# Patient Record
Sex: Male | Born: 1960
Health system: Southern US, Community
[De-identification: ages and names within clinical notes are randomized; demographics above are authoritative.]

## PROBLEM LIST (undated history)

## (undated) DIAGNOSIS — E039 Hypothyroidism, unspecified: Secondary | ICD-10-CM

## (undated) DIAGNOSIS — I10 Essential (primary) hypertension: Secondary | ICD-10-CM

## (undated) DIAGNOSIS — E079 Disorder of thyroid, unspecified: Secondary | ICD-10-CM

## (undated) HISTORY — DX: Essential (primary) hypertension: I10

## (undated) HISTORY — PX: CERVICAL DISC SURGERY: SHX588

## (undated) HISTORY — PX: SHOULDER SURGERY: SHX246

## (undated) HISTORY — PX: QUADRICEPS TENDON REPAIR: SHX756

## (undated) HISTORY — DX: Disorder of thyroid, unspecified: E07.9

---

## 1999-08-27 ENCOUNTER — Encounter: Payer: Self-pay | Admitting: Gastroenterology

## 1999-08-27 ENCOUNTER — Encounter: Admission: RE | Admit: 1999-08-27 | Discharge: 1999-08-27 | Payer: Self-pay | Admitting: Gastroenterology

## 2002-04-25 ENCOUNTER — Encounter: Payer: Self-pay | Admitting: Emergency Medicine

## 2002-04-25 ENCOUNTER — Emergency Department (HOSPITAL_COMMUNITY): Admission: EM | Admit: 2002-04-25 | Discharge: 2002-04-25 | Payer: Self-pay | Admitting: Emergency Medicine

## 2002-06-15 ENCOUNTER — Encounter: Admission: RE | Admit: 2002-06-15 | Discharge: 2002-06-15 | Payer: Self-pay | Admitting: Neurosurgery

## 2002-06-15 ENCOUNTER — Encounter: Payer: Self-pay | Admitting: Neurosurgery

## 2002-06-29 ENCOUNTER — Encounter: Payer: Self-pay | Admitting: Neurosurgery

## 2002-06-29 ENCOUNTER — Encounter: Admission: RE | Admit: 2002-06-29 | Discharge: 2002-06-29 | Payer: Self-pay | Admitting: Neurosurgery

## 2002-07-20 ENCOUNTER — Encounter: Payer: Self-pay | Admitting: Neurosurgery

## 2002-07-20 ENCOUNTER — Encounter: Admission: RE | Admit: 2002-07-20 | Discharge: 2002-07-20 | Payer: Self-pay | Admitting: Neurosurgery

## 2002-07-29 ENCOUNTER — Encounter: Payer: Self-pay | Admitting: Emergency Medicine

## 2002-07-29 ENCOUNTER — Encounter: Admission: RE | Admit: 2002-07-29 | Discharge: 2002-07-29 | Payer: Self-pay | Admitting: Emergency Medicine

## 2003-11-18 ENCOUNTER — Emergency Department (HOSPITAL_COMMUNITY): Admission: EM | Admit: 2003-11-18 | Discharge: 2003-11-18 | Payer: Self-pay | Admitting: Emergency Medicine

## 2003-12-06 ENCOUNTER — Ambulatory Visit (HOSPITAL_COMMUNITY): Admission: RE | Admit: 2003-12-06 | Discharge: 2003-12-06 | Payer: Self-pay | Admitting: Orthopedic Surgery

## 2003-12-14 ENCOUNTER — Ambulatory Visit (HOSPITAL_COMMUNITY): Admission: RE | Admit: 2003-12-14 | Discharge: 2003-12-14 | Payer: Self-pay | Admitting: Orthopedic Surgery

## 2003-12-27 ENCOUNTER — Encounter: Admission: RE | Admit: 2003-12-27 | Discharge: 2004-01-19 | Payer: Self-pay | Admitting: Neurological Surgery

## 2005-11-22 ENCOUNTER — Ambulatory Visit: Payer: Self-pay | Admitting: Cardiology

## 2007-02-16 ENCOUNTER — Ambulatory Visit: Payer: Self-pay | Admitting: Cardiology

## 2007-02-16 LAB — CONVERTED CEMR LAB
AST: 37 units/L (ref 0–37)
BUN: 19 mg/dL (ref 6–23)
Bilirubin, Direct: 0.1 mg/dL (ref 0.0–0.3)
CO2: 29 meq/L (ref 19–32)
Chloride: 114 meq/L — ABNORMAL HIGH (ref 96–112)
Cholesterol: 166 mg/dL (ref 0–200)
Creatinine, Ser: 1.4 mg/dL (ref 0.4–1.5)
GFR calc Af Amer: 71 mL/min
HDL: 33.7 mg/dL — ABNORMAL LOW (ref >39.0)
Potassium: 3.9 meq/L (ref 3.5–5.1)
Sodium: 145 meq/L (ref 135–145)

## 2007-03-06 ENCOUNTER — Encounter: Payer: Self-pay | Admitting: Cardiology

## 2007-03-06 ENCOUNTER — Ambulatory Visit: Payer: Self-pay

## 2007-06-15 ENCOUNTER — Emergency Department (HOSPITAL_COMMUNITY): Admission: EM | Admit: 2007-06-15 | Discharge: 2007-06-16 | Payer: Self-pay | Admitting: Emergency Medicine

## 2008-07-14 ENCOUNTER — Emergency Department (HOSPITAL_COMMUNITY): Admission: EM | Admit: 2008-07-14 | Discharge: 2008-07-14 | Payer: Self-pay | Admitting: Family Medicine

## 2009-03-20 ENCOUNTER — Emergency Department (HOSPITAL_COMMUNITY): Admission: EM | Admit: 2009-03-20 | Discharge: 2009-03-20 | Payer: Self-pay | Admitting: Emergency Medicine

## 2010-12-25 NOTE — Assessment & Plan Note (Signed)
[facility]                            CARDIOLOGY OFFICE NOTE   NAME:Jeremiah Webb, Jeremiah Webb                       MRN:          [id number]  DATE:02/16/2007                            DOB:          1960-12-07    HISTORY OF PRESENT ILLNESS:  Jeremiah Webb is a 50 year old gentleman who I  last saw in April 2007.  He has a history of palpitations secondary to  PVCs.  Note a stress echocardiogram was performed in May 2001.  At that  time, there was no ischemia, and there was normal LV function.  He has  done well since then.  However, recently, he has noticed worsening  palpitations.  They are described as a stop and then his heart  resumes.  They are not sustained.  Note, he occasionally feels a pain in  his chest for 1-2 seconds, but he does not have exertional chest pain.  There is no dyspnea on exertion, orthopnea or PND.  Because of his  palpitations, he wanted to be seen.  Also note, there is no history of  syncope or presyncope, and there is no family history of sudden death.  Also note, he is not exercising quite as much as he did formally, due to  his job.  His medications at present including multivitamin .   PHYSICAL EXAMINATION:  VITAL SIGNS:  Today, blood pressure 144/85, pulse  63, weight 211 pounds.  HEENT:  Normal.  NECK:  Supple with no bruits.  CHEST:  Clear.  CARDIOVASCULAR:  Regular rate and rhythm.  There are no murmurs, rubs or  gallops noted.  ABDOMEN:  Benign.  EXTREMITIES:  No edema.   STUDIES:  Electrocardiogram today shows a normal sinus rhythm at a rate  of 59.  There is no RV conduction delay.   DIAGNOSES:  1. Palpitations secondary to PVCs.  The way he is describing it, these      again sound to be PVCs.  I have explained in the setting of normal      LV function that these are typically benign.  If they become more      bothersome in the future, then we could consider adding a beta      blocker at that point.  He is not  interested in pursuing that at      this point.  Note, we will check a TSH, B-met, lipids and liver      today.  2. Atypical chest pain.  He does have a strong family history of      coronary disease in his mother.  He is also concerned about the      possibility of coronary disease, and we will schedule him to have a      stress echocardiogram for risk stratification.  I have discussed      the importance of risk factor modification including diet and      exercise.  Note, he does not smoke.  3. Borderline hypertension.  We discussed lifestyle modifications      today including diet, improving his exercise.  He will  monitor this      at home, and if it continues to be mildly elevated, then we will      consider adding therapy in the future.  I would probably start with      the beta blocker to treat both his hypertension and his      palpitations.  4. History of hypothyroidism.  We will check a TSH today.  5. Risk factors.  We will check lipids and liver and treat as      indicated.   We will see him back in three months.     Madolyn Frieze Jeremiah Som, MD, [facility]  Electronically Signed    BSC/MedQ  DD: 02/16/2007  DT: 02/16/2007  Job #: [id number]

## 2011-02-22 ENCOUNTER — Emergency Department (HOSPITAL_COMMUNITY)
Admission: EM | Admit: 2011-02-22 | Discharge: 2011-02-22 | Disposition: A | Discharge status: Home / Self Care | Payer: Self-pay | Attending: Emergency Medicine | Admitting: Emergency Medicine

## 2011-02-22 DIAGNOSIS — S0003XA Contusion of scalp, initial encounter: Secondary | ICD-10-CM | POA: Insufficient documentation

## 2011-02-22 DIAGNOSIS — R220 Localized swelling, mass and lump, head: Secondary | ICD-10-CM | POA: Insufficient documentation

## 2011-02-22 DIAGNOSIS — R221 Localized swelling, mass and lump, neck: Secondary | ICD-10-CM | POA: Insufficient documentation

## 2011-02-22 DIAGNOSIS — IMO0002 Reserved for concepts with insufficient information to code with codable children: Secondary | ICD-10-CM | POA: Insufficient documentation

## 2011-02-22 DIAGNOSIS — E039 Hypothyroidism, unspecified: Secondary | ICD-10-CM | POA: Insufficient documentation

## 2011-05-17 LAB — DIFFERENTIAL
Lymphs Abs: 1.5 10*3/uL (ref 0.7–4.0)
Monocytes Relative: 8 % (ref 3–12)
Neutro Abs: 4.7 10*3/uL (ref 1.7–7.7)
Neutrophils Relative %: 66 % (ref 43–77)

## 2011-05-17 LAB — CBC
HCT: 43.1 % (ref 39.0–52.0)
MCV: 89.6 fL (ref 78.0–100.0)
RBC: 4.82 MIL/uL (ref 4.22–5.81)
RDW: 12 % (ref 11.5–15.5)

## 2011-05-17 LAB — POCT I-STAT, CHEM 8
BUN: 16 mg/dL (ref 6–23)
Calcium, Ion: 0.91 mmol/L — ABNORMAL LOW (ref 1.12–1.32)
Chloride: 110 mEq/L (ref 96–112)
Creatinine, Ser: 1.4 mg/dL (ref 0.4–1.5)
Potassium: 4 mEq/L (ref 3.5–5.1)
TCO2: 24 mmol/L (ref 0–100)

## 2011-07-27 ENCOUNTER — Emergency Department (HOSPITAL_COMMUNITY)
Admission: EM | Admit: 2011-07-27 | Discharge: 2011-07-27 | Disposition: A | Discharge status: Home / Self Care | Payer: Self-pay | Attending: Emergency Medicine | Admitting: Emergency Medicine

## 2011-07-27 DIAGNOSIS — X500XXA Overexertion from strenuous movement or load, initial encounter: Secondary | ICD-10-CM | POA: Insufficient documentation

## 2011-07-27 DIAGNOSIS — S8990XA Unspecified injury of unspecified lower leg, initial encounter: Secondary | ICD-10-CM | POA: Insufficient documentation

## 2011-07-27 DIAGNOSIS — S99929A Unspecified injury of unspecified foot, initial encounter: Secondary | ICD-10-CM | POA: Insufficient documentation

## 2011-07-27 DIAGNOSIS — S76119A Strain of unspecified quadriceps muscle, fascia and tendon, initial encounter: Secondary | ICD-10-CM

## 2011-07-27 DIAGNOSIS — S838X9A Sprain of other specified parts of unspecified knee, initial encounter: Secondary | ICD-10-CM | POA: Insufficient documentation

## 2011-07-27 DIAGNOSIS — S86819A Strain of other muscle(s) and tendon(s) at lower leg level, unspecified leg, initial encounter: Secondary | ICD-10-CM | POA: Insufficient documentation

## 2011-07-27 MED ORDER — IBUPROFEN 800 MG PO TABS
800.0000 mg | ORAL_TABLET | Freq: Once | ORAL | Status: AC
  Administered 2011-07-27: 800 mg via ORAL
  Filled 2011-07-27: qty 1

## 2011-07-27 MED ORDER — IBUPROFEN 800 MG PO TABS: 800.0000 mg | ORAL_TABLET | Freq: Three times a day (TID) | ORAL | Status: AC

## 2011-07-27 MED ORDER — HYDROCODONE-ACETAMINOPHEN 5-325 MG PO TABS: 1.0000 | ORAL_TABLET | ORAL | Status: AC | PRN

## 2011-07-27 NOTE — Progress Notes (Signed)
Orthopedic Tech Progress Note Patient Details:  Jeremiah Webb [birth year] [id number]  Other Ortho Devices Type of Ortho Device: Knee Immobilizer;Crutches   Webb, Jeremiah Bail 07/27/2011, 2:59 PM

## 2011-07-27 NOTE — ED Provider Notes (Signed)
History     CSN: [id number] Arrival date & time: 07/27/2011  1:40 PM   First MD Initiated Contact with Patient 07/27/11 1352      Chief Complaint  Patient presents with  . Knee Injury    (Consider location/radiation/quality/duration/timing/severity/associated sxs/prior treatment) HPI History provided by pt.   Pt was carrying a heavy TV this morning.  Stepped up onto a curb, L knee popped and gave way on him.  Knee painful, appears to be deformed and he is unable to bear weight.  No associated paresthesias.  No prior h/o injury to this knee.   Pt is not on steroids or any other medications.    History reviewed. No pertinent past medical history.  History reviewed. No pertinent past surgical history.  History reviewed. No pertinent family history.  History  Substance Use Topics  . Smoking status: Never Smoker   . Smokeless tobacco: Not on file  . Alcohol Use: Yes      Review of Systems  All other systems reviewed and are negative.    Allergies  Review of patient's allergies indicates no known allergies.  Home Medications  No current outpatient prescriptions on file.  BP 128/77  Pulse 76  Temp(Src) 98.2 F (36.8 C) (Oral)  Resp 20  Ht 6\' 3"  (1.905 m)  Wt 225 lb (102.059 kg)  BMI 28.12 kg/m2  SpO2 96%  Physical Exam  Nursing note and vitals reviewed. Constitutional: He is oriented to person, place, and time. He appears well-developed and well-nourished. No distress.  HENT:  Head: Normocephalic and atraumatic.  Eyes:       Normal appearance  Neck: Normal range of motion.  Musculoskeletal:       Knee w/out deformity or edema.  Quadricepts tendon appears to be ruptured w/ retracted and bulging quadricept muscle.  Knee tenderness localized to medial joint line.  No pain w/ passive ROM.  Pt is unable to actively extend knee nor hold it in extension.  Distal NV intact.   Neurological: He is alert and oriented to person, place, and time.  Psychiatric: He has a  normal mood and affect. His behavior is normal.    ED Course  Procedures (including critical care time)  Labs Reviewed - No data to display No results found.   1. Ruptured, tendon, quadriceps       MDM  Pt presents w/ left knee injury.  S/sx most consistent w/ quadricepts tendon rupture.  [facility] consulted and recommended he call the office Monday.  Ortho tech placed in knee immobilizer and provided him w/ crutches.  Pt received ibuprofen in ED and d/c'd home w/ same + vicodin.          Otilio Miu, [place] 07/27/11 1753

## 2011-07-27 NOTE — ED Notes (Signed)
Ortho Tech at bedside.  

## 2011-07-27 NOTE — ED Notes (Signed)
Lt. Knee swelling and pain, pt. Lifting a TV and his lt. Knee gave out.

## 2011-07-28 NOTE — ED Provider Notes (Signed)
Medical screening examination/treatment/procedure(s) were performed by non-physician practitioner and as supervising physician I was immediately available for consultation/collaboration.  Mavis Fichera R Carey Johndrow, MD 07/28/11 1030 

## 2011-09-15 ENCOUNTER — Other Ambulatory Visit: Payer: Self-pay | Admitting: Physician Assistant

## 2011-09-15 DIAGNOSIS — E039 Hypothyroidism, unspecified: Secondary | ICD-10-CM

## 2011-09-15 MED ORDER — LEVOTHYROXINE SODIUM 125 MCG PO TABS: 125.0000 ug | ORAL_TABLET | Freq: Every day | ORAL | Status: DC

## 2011-09-18 ENCOUNTER — Ambulatory Visit (INDEPENDENT_AMBULATORY_CARE_PROVIDER_SITE_OTHER): Payer: BC Managed Care – PPO | Admitting: Family Medicine

## 2011-09-18 VITALS — BP 116/90 | HR 72 | Temp 98.0°F | Resp 16 | Ht 74.0 in | Wt 208.4 lb

## 2011-09-18 DIAGNOSIS — S838X9A Sprain of other specified parts of unspecified knee, initial encounter: Secondary | ICD-10-CM

## 2011-09-18 DIAGNOSIS — Z842 Family history of other diseases of the genitourinary system: Secondary | ICD-10-CM

## 2011-09-18 DIAGNOSIS — S86819A Strain of other muscle(s) and tendon(s) at lower leg level, unspecified leg, initial encounter: Secondary | ICD-10-CM

## 2011-09-18 DIAGNOSIS — E039 Hypothyroidism, unspecified: Secondary | ICD-10-CM

## 2011-09-18 DIAGNOSIS — S76119A Strain of unspecified quadriceps muscle, fascia and tendon, initial encounter: Secondary | ICD-10-CM

## 2011-09-18 LAB — COMPREHENSIVE METABOLIC PANEL
Albumin: 4 g/dL (ref 3.5–5.2)
BUN: 17 mg/dL (ref 6–23)
Calcium: 10 mg/dL (ref 8.4–10.5)
Chloride: 106 mEq/L (ref 96–112)
Creat: 1.35 mg/dL (ref 0.50–1.35)
Glucose, Bld: 100 mg/dL — ABNORMAL HIGH (ref 70–99)
Potassium: 4.4 mEq/L (ref 3.5–5.3)
Sodium: 141 mEq/L (ref 135–145)
Total Bilirubin: 0.7 mg/dL (ref 0.3–1.2)
Total Protein: 7.2 g/dL (ref 6.0–8.3)

## 2011-09-18 LAB — PSA: PSA: 1.31 ng/mL (ref <=4.00)

## 2011-09-18 LAB — TSH: TSH: 8.825 u[IU]/mL — ABNORMAL HIGH (ref 0.350–4.500)

## 2011-09-18 LAB — LIPID PANEL
Cholesterol: 147 mg/dL (ref 0–200)
Triglycerides: 123 mg/dL (ref <150)

## 2011-09-18 MED ORDER — LEVOTHYROXINE SODIUM 125 MCG PO TABS: 125.0000 ug | ORAL_TABLET | Freq: Every day | ORAL | Status: DC

## 2011-09-18 NOTE — Progress Notes (Signed)
  Subjective:    Patient ID: Jeremiah Webb, male    DOB: [birth year], 51 y.o.   MRN: [id number]  HPI patient is here for recheck with regard to his thyroid. He took his last medicine today. He's been doing well with that with no known problems.  He did have a bad incident with his left leg before Christmas when he ruptured the patellar tendon. Dr. Thomasena Edis did surgical repair of this, and he is improving significantly. He hopes that he can return to work next month.  The patient does request a general chemistry cyst is been a number of years since he had them done. There is a family history of benign prostatic hypertrophy, so wants to be sure to get that PSA check.    Review of Systems he does have some headaches, not sure why. Cardiovascular respiratory GI and GU review of systems are all unremarkable.     Objective:   Physical Exam  Pleasant Afro-American male in no acute distress this time TMs normal throat clear neck supple without nodes chest clear heart regular without murmurs gallops arrhythmias      Assessment & Plan:  Hypothyroidism Recent rupture or quadriceps tendon  Check lab work refill his medication see him back in the fall for acute

## 2011-09-18 NOTE — Patient Instructions (Signed)
Jeremiah Webb will be set to the patient. If he does not hear from [place] in about 10 days he is to contact us

## 2011-09-19 ENCOUNTER — Other Ambulatory Visit: Payer: Self-pay | Admitting: Family Medicine

## 2011-09-19 ENCOUNTER — Encounter: Payer: Self-pay | Admitting: Family Medicine

## 2011-09-19 DIAGNOSIS — E039 Hypothyroidism, unspecified: Secondary | ICD-10-CM

## 2011-09-21 MED ORDER — LEVOTHYROXINE SODIUM 150 MCG PO TABS: 150.0000 ug | ORAL_TABLET | Freq: Every day | ORAL | Status: DC

## 2011-09-21 NOTE — Progress Notes (Signed)
Addended by: Deigo Alonso H on: 09/21/2011 06:56 PM   Modules accepted: Orders

## 2012-06-06 ENCOUNTER — Telehealth: Payer: Self-pay

## 2012-06-06 DIAGNOSIS — E039 Hypothyroidism, unspecified: Secondary | ICD-10-CM

## 2012-06-06 MED ORDER — LEVOTHYROXINE SODIUM 150 MCG PO TABS: 150.0000 ug | ORAL_TABLET | Freq: Every day | ORAL | Status: DC

## 2012-06-06 NOTE — Telephone Encounter (Signed)
Our documentation indicates that he should be on levothyroxine 150 mcg. I will send in levothyroxine 150 mcg. He does need to recheck this before he runs out.

## 2012-06-06 NOTE — Telephone Encounter (Signed)
Spoke with pt his dosage was changed from 125 mcg to 150 mcg of Synthroid. His pharmacy gave him the old RX of 125 mcg. Can we send in another RX for Synthroid 150 mcg? Please advise

## 2012-06-06 NOTE — Telephone Encounter (Signed)
Spoke with pharmacist advised pt Rx ready to pick up

## 2012-09-15 ENCOUNTER — Other Ambulatory Visit: Payer: Self-pay | Admitting: Physician Assistant

## 2012-09-16 NOTE — Telephone Encounter (Signed)
Needs ov and labs, last seen Feb 2013

## 2012-10-08 ENCOUNTER — Ambulatory Visit (INDEPENDENT_AMBULATORY_CARE_PROVIDER_SITE_OTHER): Payer: Commercial Managed Care - PPO | Admitting: Family Medicine

## 2012-10-08 VITALS — BP 124/75 | HR 66 | Temp 97.8°F | Resp 18 | Ht 73.5 in | Wt 222.0 lb

## 2012-10-08 DIAGNOSIS — E039 Hypothyroidism, unspecified: Secondary | ICD-10-CM

## 2012-10-08 NOTE — Progress Notes (Signed)
Jeremiah Webb is a 52 y.o. male who presents to Urgent Care today with complaints of hypothyroidism:  1. Hypothyroidism:  Patient diagnosed with hypothyroidism several years ago.  In the computer we have TSH of 13.  Started on Synthroid and has had increasing dosages of Synthroid since then.  Last TSH was 8.825 in 2013.  Has never had any symptoms of hypothyrodisim.  Has been out of his medications for past 2 days.  Doesn't want to take medication if he doesn't have to.  No other chronic medical issues.  No diabetes.    He denies any current or past chest pain, palpitations, SOB, fever, chills, heat/cold intolerance, edema, history of diabetes, polyuria/polydipsia.  He endorses healthy diet and regular working out.   PMH reviewed.  Past Medical History  Diagnosis Date  . Thyroid disease     Hypothyroid   Past Surgical History  Procedure Laterality Date  . Patellar tendon repair    . Quadriceps tendon repair      Medications reviewed. Current Outpatient Prescriptions  Medication Sig Dispense Refill  . levothyroxine (SYNTHROID, LEVOTHROID) 150 MCG tablet Take 1 tablet (150 mcg total) by mouth daily. NEED VISIT/LABS!!  15 tablet  0   No current facility-administered medications for this visit.    ROS as above otherwise neg.    Physical Exam:  BP 124/75  Pulse 66  Temp(Src) 97.8 F (36.6 C) (Oral)  Resp 18  Ht 6' 1.5" (1.867 m)  Wt 222 lb (100.699 kg)  BMI 28.89 kg/m2  SpO2 97% Gen:  Alert, cooperative patient who appears stated age in no acute distress.  Vital signs reviewed. Neck:  No goiter noted   Assessment and Plan:  1.  Hypothyroidism: - Checking TSH today.  Has been subclinical hypothyroidism based on lab levels.  - Patient would like to hold off on any medication for now.  - Will recheck TSH in 6 weeks to get better view of how his hypothyroidism will be off medications. - Of note, his cardiologist has told him that his TSH reveals hypothyroidism as well.  Will  need to obtain this records.  If these show evidence of hypothyroidism, will restart synthroid at that time.

## 2012-10-29 ENCOUNTER — Telehealth: Payer: Self-pay

## 2012-10-29 NOTE — Telephone Encounter (Signed)
Pt called inquiring about his labs. He saw Dr. Gwendolyn Grant. Can someone review them for me please. Thanks

## 2012-10-30 MED ORDER — LEVOTHYROXINE SODIUM 50 MCG PO TABS: 50.0000 ug | ORAL_TABLET | Freq: Every day | ORAL | Status: DC

## 2012-10-30 NOTE — Telephone Encounter (Signed)
Labs indicated a worsening hypothyroidism since he has been off of his medication. Recommend going back on medication and recheck TSH in 6-8 weeks. Please send the patient some information on hypothyroidism, risks, and complications. Thank you.

## 2012-10-30 NOTE — Telephone Encounter (Signed)
Called him to advise.  

## 2012-10-30 NOTE — Telephone Encounter (Signed)
Patient is also advised of the need to return for recheck of TSH in 6-8 weeks so his dose can be titrated.

## 2013-01-08 ENCOUNTER — Encounter (HOSPITAL_COMMUNITY): Payer: Self-pay | Admitting: Emergency Medicine

## 2013-01-08 ENCOUNTER — Emergency Department (INDEPENDENT_AMBULATORY_CARE_PROVIDER_SITE_OTHER)
Admission: EM | Admit: 2013-01-08 | Discharge: 2013-01-08 | Disposition: A | Discharge status: Home / Self Care | Payer: Commercial Managed Care - PPO | Source: Home / Self Care | Attending: Emergency Medicine | Admitting: Emergency Medicine

## 2013-01-08 ENCOUNTER — Emergency Department (INDEPENDENT_AMBULATORY_CARE_PROVIDER_SITE_OTHER): Payer: Commercial Managed Care - PPO

## 2013-01-08 DIAGNOSIS — M109 Gout, unspecified: Secondary | ICD-10-CM

## 2013-01-08 LAB — URIC ACID: Uric Acid, Serum: 10 mg/dL — ABNORMAL HIGH (ref 4.0–7.8)

## 2013-01-08 MED ORDER — KETOROLAC TROMETHAMINE 60 MG/2ML IM SOLN
60.0000 mg | Freq: Once | INTRAMUSCULAR | Status: AC
  Administered 2013-01-08: 60 mg via INTRAMUSCULAR

## 2013-01-08 MED ORDER — COLCHICINE 0.6 MG PO TABS: ORAL_TABLET | ORAL | Status: DC

## 2013-01-08 MED ORDER — KETOROLAC TROMETHAMINE 60 MG/2ML IM SOLN
INTRAMUSCULAR | Status: AC
  Filled 2013-01-08: qty 2

## 2013-01-08 NOTE — ED Provider Notes (Signed)
Chief Complaint:   Chief Complaint  Patient presents with  . Ankle Pain    right ankle pain since tuesday. denies injury    History of Present Illness:   Jeremiah Webb is a 52 year old male who has had a four-day history of right ankle pain, swelling, redness, and heat. He denies any injury. He's had no fever or chills. He denies any other joint pain or history of arthritis. He has no definite history of gout although he did have a uric acid level done 5 years ago which was elevated at 8.2. He cannot recall what prompted this uric acid to be done, and never received any treatment for it. He denies any specific precipitating factors although does note that this seemed to come on the day after his birthday during which he did partake of several alcoholic beverages.  Review of Systems:  Other than noted above, the patient denies any of the following symptoms: Systemic:  No fevers, chills, sweats, or aches.  No fatigue or tiredness. Musculoskeletal:  No joint pain, arthritis, bursitis, swelling, back pain, or neck pain. Neurological:  No muscular weakness, paresthesias, headache, or trouble with speech or coordination.  No dizziness.  PMFSH:  Past medical history, family history, social history, meds, and allergies were reviewed.    Physical Exam:   Vital signs:  BP 128/98  Pulse 88  Temp(Src) 97.9 F (36.6 C) (Oral)  SpO2 99% Gen:  Alert and oriented times 3.  In no distress. Musculoskeletal:  The right ankle was swollen, red, hot, and tender to palpation. Ankle range of motion was decreased with pain, joint survey was otherwise unremarkable.  Otherwise, all joints had a full a ROM with no swelling, bruising or deformity.  No edema, pulses full. Extremities were warm and pink.  Capillary refill was brisk.  Skin:  Clear, warm and dry.  No rash. Neuro:  Alert and oriented times 3.  Muscle strength was normal.  Sensation was intact to light touch.   Radiology:  Dg Ankle Complete  Right  01/08/2013   *RADIOLOGY REPORT*  Clinical Data: Ankle pain for 4 days without trauma.  RIGHT ANKLE - COMPLETE 3+ VIEW  Comparison: None.  Findings: Diffuse soft tissue swelling. No acute fracture or dislocation.  Base of fifth metatarsal and talar dome intact.  IMPRESSION: Soft tissue swelling, without acute osseous abnormality.   Original Report Authenticated By: Jeronimo Greaves, M.D.   I reviewed the images independently and personally and concur with the radiologist's findings.  Results for orders placed during the hospital encounter of 01/08/13  URIC ACID      Result Value Range   Uric Acid, Serum 10.0 (*) 4.0 - 7.8 mg/dL   Course in Urgent Care Center:   Given Toradol 60 mg IM.   Assessment:  The encounter diagnosis was Gout attack.  Uric acid was markedly elevated at 10.0. In this setting, gout is most likely. He was given a prescription for colchicine, and I urged him to followup with her primary care physician with regard to the elevated uric acid level and suggested getting on any urate lowering drug.   Plan:   1.  The following meds were prescribed:   Discharge Medication List as of 01/08/2013  6:12 PM    START taking these medications   Details  colchicine 0.6 MG tablet Take 2 now and 1 in 1 hour.  May repeat dose once daily.  For gout attack., Normal       2.  The  patient was instructed in symptomatic care, including rest and activity, elevation, application of ice and compression.  Appropriate handouts were given. 3.  The patient was told to return if becoming worse in any way, if no better in 3 or 4 days, and given some red flag symptoms such as  fever or worsening pain that would indicate earlier return.   4.  The patient was told to follow up With a primary care physician as soon as possible.    Reuben Likes, MD 01/08/13 2002

## 2013-01-08 NOTE — ED Notes (Signed)
Pt c/o pain in right ankle that started on Tuesday.  Pain has gradually gotten worse and swelling started on Thursday.  Pain with weight bearing. Pt has not tried any medication for treatment.  Denies injury.

## 2013-01-11 NOTE — ED Notes (Signed)
Patient called, asking for referral to Urologist; per Dr Lorenz Coaster, may refer to Dr Madaline Guthrie

## 2013-06-05 ENCOUNTER — Telehealth: Payer: Self-pay

## 2013-06-05 NOTE — Telephone Encounter (Signed)
Pt has been out of his thyroid medicine for about 4 weeks.  Says he is working extremely long hours and has not been able to come in for an OV.  Can we refill the medicine for him?  [phone]

## 2013-06-06 NOTE — Telephone Encounter (Signed)
In Feb he was told to follow up in 6 weeks, our office hours are very flexible. He still indicates inability to come in. Please advise.

## 2013-06-07 MED ORDER — LEVOTHYROXINE SODIUM 50 MCG PO TABS: 50.0000 ug | ORAL_TABLET | Freq: Every day | ORAL | Status: DC

## 2013-06-07 NOTE — Telephone Encounter (Signed)
I have sent two refills to pharmacy; he MUST have blood work in two months while taking medication; I will not send in further refills. We are open seven days a week; we are available to see him any day.

## 2013-06-08 NOTE — Telephone Encounter (Signed)
Called to advise. He is a Naval architect. 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 He will come in before this runs out.

## 2013-09-05 ENCOUNTER — Ambulatory Visit: Payer: Commercial Managed Care - PPO

## 2013-09-05 ENCOUNTER — Ambulatory Visit: Payer: Commercial Managed Care - PPO | Admitting: Emergency Medicine

## 2013-09-05 VITALS — BP 130/82 | HR 87 | Temp 98.7°F | Resp 17 | Ht 74.0 in | Wt 231.0 lb

## 2013-09-05 DIAGNOSIS — E039 Hypothyroidism, unspecified: Secondary | ICD-10-CM

## 2013-09-05 MED ORDER — LEVOTHYROXINE SODIUM 50 MCG PO TABS: 50.0000 ug | ORAL_TABLET | Freq: Every day | ORAL | Status: DC

## 2013-09-05 NOTE — Progress Notes (Signed)
[facility] [address], [place] [address] [phone]  Date:  09/05/2013   Name:  Jeremiah Webb   DOB:  1961-05-11   MRN:  [id number]  PCP:  Nilda Simmer, MD    Chief Complaint: Shortness of Breath and Thyroid Problem   History of Present Illness:  Jeremiah Webb is a 53 y.o. very pleasant male patient who presents with the following:  Not compliant with management of hypothyroidism.  Out of medication and requesting labs.  Not symptomatic.  Says he has shortness of breath when exposed to "vapors" as when eats steaming soup.  Concerned he may be "developing" COPD, asthma or CA lung. Non smoker.  Able to walk, climb steps and work with no SOB.  Has no cough, fever chills, wheezing.  No improvement with over the counter medications or other home remedies. Denies other complaint or health concern today.   There are no active problems to display for this patient.   Past Medical History  Diagnosis Date  . Thyroid disease     Hypothyroid    Past Surgical History  Procedure Laterality Date  . Patellar tendon repair    . Quadriceps tendon repair      History  Substance Use Topics  . Smoking status: Never Smoker   . Smokeless tobacco: Not on file  . Alcohol Use: Yes    No family history on file.  No Known Allergies  Medication list has been reviewed and updated.  Current Outpatient Prescriptions on File Prior to Visit  Medication Sig Dispense Refill  . levothyroxine (SYNTHROID, LEVOTHROID) 50 MCG tablet Take 1 tablet (50 mcg total) by mouth daily.  30 tablet  1   No current facility-administered medications on file prior to visit.    Review of Systems:  As per HPI, otherwise negative.    Physical Examination: Filed Vitals:   09/05/13 1052  BP: 130/82  Pulse: 87  Temp: 98.7 F (37.1 C)  Resp: 17   Filed Vitals:   09/05/13 1052  Height: 6\' 2"  (1.88 m)  Weight: 231 lb (104.781 kg)   Body mass index is 29.65 kg/(m^2). Ideal  Body Weight: Weight in (lb) to have BMI = 25: 194.3  GEN: WDWN, NAD, Non-toxic, A & O x 3 HEENT: Atraumatic, Normocephalic. Neck supple. No masses, No LAD. Ears and Nose: No external deformity. CV: RRR, No M/G/R. No JVD. No thrill. No extra heart sounds. PULM: CTA B, no wheezes, crackles, rhonchi. No retractions. No resp. distress. No accessory muscle use. ABD: S, NT, ND, +BS. No rebound. No HSM. EXTR: No c/c/e NEURO Normal gait.  PSYCH: Normally interactive. Conversant. Not depressed or anxious appearing.  Calm demeanor.    Assessment and Plan: Hypothyroid.  Off medications.  Refill for a month with no refills and follow up in the month for TSH Shortness of breath.  CXR Signed,  Phillips Odor, MD   [facility] reading (PRIMARY) by  Dr. Dareen Piano.  Negative chest.

## 2013-09-30 ENCOUNTER — Encounter: Payer: Self-pay | Admitting: Gastroenterology

## 2013-10-07 ENCOUNTER — Other Ambulatory Visit: Payer: Self-pay | Admitting: Emergency Medicine

## 2013-10-17 ENCOUNTER — Ambulatory Visit: Payer: Commercial Managed Care - PPO | Admitting: Family Medicine

## 2013-10-17 VITALS — BP 110/70 | HR 82 | Temp 98.5°F | Resp 16 | Ht 75.0 in | Wt 230.0 lb

## 2013-10-17 DIAGNOSIS — E039 Hypothyroidism, unspecified: Secondary | ICD-10-CM

## 2013-10-17 LAB — TSH: TSH: 14.101 u[IU]/mL — ABNORMAL HIGH (ref 0.350–4.500)

## 2013-10-17 MED ORDER — LEVOTHYROXINE SODIUM 50 MCG PO TABS: ORAL_TABLET | ORAL | Status: DC

## 2013-10-17 NOTE — Patient Instructions (Signed)

## 2013-10-17 NOTE — Progress Notes (Signed)
° °  Subjective:    Patient ID: Jeremiah Webb, male    DOB: Apr 11, 1961, 53 y.o.   MRN: [id number]  HPI This chart was scribed for Elvina Sidle, MD by Andrew Au, ED Scribe. This patient was seen in room 9 and the patient's care was started at 2:04 PM.  HPI Comments: Jeremiah Webb is a 53 y.o. male who [facility] to the [facility] requesting to get his TSH levels checked. Pt states that he has Hypothyroid and reports that he takes 50 mg synthroid daily. He reports that he has taken medication now for a couple years. Pt reports that he tries to stay in shape and that he goes to [employer].  Pt works as a Airline pilot.  Past Medical History  Diagnosis Date   Thyroid disease     Hypothyroid   No Known Allergies Prior to Admission medications   Medication Sig Start Date End Date Taking? Authorizing Provider  levothyroxine (SYNTHROID, LEVOTHROID) 50 MCG tablet Take 1 tablet (50 mcg total) by mouth daily. ABSOLUTELY NO refills with out lab tests 09/05/13  Yes Phillips Odor, MD   Review of Systems  Constitutional: Negative for fever and chills.      Objective:   Physical Exam  Nursing note and vitals reviewed. Constitutional: He is oriented to person, place, and time. He appears well-developed and well-nourished. No distress.  HENT:  Head: Normocephalic and atraumatic.  Eyes: EOM are normal.  Neck: Neck supple.  Cardiovascular: Normal rate.   Pulmonary/Chest: Effort normal.  Musculoskeletal: Normal range of motion.  Neurological: He is alert and oriented to person, place, and time.  Skin: Skin is warm and dry.  Psychiatric: He has a normal mood and affect. His behavior is normal.      Assessment & Plan:   Unspecified hypothyroidism - Plan: TSH  Signed, Elvina Sidle, MD

## 2013-10-18 ENCOUNTER — Other Ambulatory Visit: Payer: Self-pay | Admitting: Family Medicine

## 2013-10-18 ENCOUNTER — Telehealth: Payer: Self-pay

## 2013-10-18 DIAGNOSIS — E039 Hypothyroidism, unspecified: Secondary | ICD-10-CM

## 2013-10-18 MED ORDER — LEVOTHYROXINE SODIUM 100 MCG PO TABS: 100.0000 ug | ORAL_TABLET | Freq: Every day | ORAL | Status: DC

## 2013-10-18 NOTE — Telephone Encounter (Signed)
Result Notes    Notes Recorded by Ericka Pontiff on 10/18/2013 at 10:56 AM Left message for patient to call back. ------  Notes Recorded by Elvina Sidle, MD on 10/18/2013 at 10:30 AM Patient has abnormal lab values. Patient needs to double his dose of synthroid.  I will call in new script, but he can use up his 50 mcg tablets by taking two daily now           Results   TSH (Order [id number])       TSH  Status: Final result     Visible to patient: This result is not viewable by the patient.     Next appt: 11/15/2013 at 04:30 PM in Gastroenterology ([facility] Pre[facility] Rm[facility])     Dx: Unspecified hypothyroidism               Notes Recorded by Ericka Pontiff on 10/18/2013 at 10:56 AM Left message for patient to call back. Notes Recorded by Elvina Sidle, MD on 10/18/2013 at 10:30 AM Patient has abnormal lab values. Patient needs to double his dose of synthroid.  I will call in new script, but he can use up his 50 mcg tablets by taking two daily now        Ref Range 1d ago  28yr ago  70yr ago  26yr ago    TSH 0.350 - 4.500 uIU/mL 14.101 (H)    13.001 (H)    8.825 (H)    13.27 (H) R    Resulting Agency   [facility] [place] [facility]      Result Narrative        Performed at:  [facility]                [address], [address]                [place], [place] [address]         Specimen Collected: 10/17/13  2:24 PM Last Resulted: 10/17/13  7:02 PM            R=Reference range differs from displayed range                    Reviewed by List    Ericka Pontiff on 10/18/2013 11:13 AM    Elvina Sidle, MD on 10/18/2013 10:30 AM       Encounter    View Encounter      Result Information         Abnormal    Status: Final result (10/17/2013  7:02 PM)    Provider Status: Reviewed        Lab Information    [facility]                   Order-Level Documents:    There are no order-level documents.         TSH (Order [id number])  Lab  Order:  [id number]   Date: 10/17/2013  Department: [facility]  Ordering/Authorizing: Elvina Sidle, MD          Order Information      Order Date/Time Release Date/Time Start Date/Time End Date/Time      10/17/13 02:13 PM None 10/17/2013 None               Order Details      Frequency Duration Priority Order Class      None None Routine Clinic Collect  Acc#      [id number]              Associated Diagnoses      Unspecified hypothyroidism [244.9]  - Primary              Order History Outpatient      Date/Time Action Taken User Additional Information      10/17/13 1413 Sign Elvina SidleKurt Lauenstein, MD        10/17/13 1624 Result [facility] Interface In process      10/17/13 1902 Result [facility] Final             Order Requisition      TSH (Order #[id number]) on 10/17/13        Patient has abnormal lab values. Patient needs to double his dose of synthroid. I will call in new script, but he can use up his 50 mcg tablets by taking two daily now     Collection Information      Collected: 10/17/2013  2:24 PM   Resulting Agency: [facility]                 Reviewed labs with patient.  Patient states understanding.

## 2013-11-15 ENCOUNTER — Ambulatory Visit (AMBULATORY_SURGERY_CENTER): Payer: Commercial Managed Care - PPO | Admitting: *Deleted

## 2013-11-15 VITALS — Ht 75.0 in | Wt 234.4 lb

## 2013-11-15 DIAGNOSIS — Z1211 Encounter for screening for malignant neoplasm of colon: Secondary | ICD-10-CM

## 2013-11-15 DIAGNOSIS — Z800 Family history of malignant neoplasm of digestive organs: Secondary | ICD-10-CM

## 2013-11-15 MED ORDER — MOVIPREP 100 G PO SOLR: 1.0000 | Freq: Once | ORAL | Status: DC

## 2013-11-15 NOTE — Progress Notes (Signed)
Denies allergies to eggs or soy products. Denies complications with sedation or anesthesia. 

## 2013-11-24 ENCOUNTER — Encounter: Payer: Self-pay | Admitting: Gastroenterology

## 2013-11-26 ENCOUNTER — Encounter: Payer: Self-pay | Admitting: Gastroenterology

## 2013-11-26 ENCOUNTER — Ambulatory Visit (AMBULATORY_SURGERY_CENTER): Payer: Commercial Managed Care - PPO | Admitting: Gastroenterology

## 2013-11-26 VITALS — BP 131/81 | HR 58 | Temp 97.6°F | Resp 32 | Ht 75.0 in | Wt 234.0 lb

## 2013-11-26 DIAGNOSIS — Z800 Family history of malignant neoplasm of digestive organs: Secondary | ICD-10-CM

## 2013-11-26 DIAGNOSIS — Z1211 Encounter for screening for malignant neoplasm of colon: Secondary | ICD-10-CM

## 2013-11-26 MED ORDER — SODIUM CHLORIDE 0.9 % IV SOLN: 500.0000 mL | INTRAVENOUS | Status: DC

## 2013-11-26 NOTE — Op Note (Signed)
[facility] [address] [place], [address]   COLONOSCOPY PROCEDURE REPORT  PATIENT: Jeremiah Webb, Jeremiah Webb  MR#: [id number] BIRTHDATE: June 05, 1961 , 52  yrs. old GENDER: Male ENDOSCOPIST: Rachael Fee, MD REFERRED BP:ZWCHEN Katrinka Blazing, M.D. PROCEDURE DATE:  11/26/2013 PROCEDURE:   Colonoscopy, screening First Screening Colonoscopy - Avg.  risk and is 50 yrs.  old or older - No.  Prior Negative Screening - Now for repeat screening. 10 or more years since last screening  History of Adenoma - Now for follow-up colonoscopy & has been > or = to 3 yrs.  N/A  Polyps Removed Today? No.  Recommend repeat exam, <10 yrs? No. ASA CLASS:   Class II INDICATIONS:average risk screening (one grandparent with colon cancer) MEDICATIONS: MAC sedation, administered by CRNA and propofol (Diprivan) 200mg  IV  DESCRIPTION OF PROCEDURE:   After the risks benefits and alternatives of the procedure were thoroughly explained, informed consent was obtained.  A digital rectal exam revealed no abnormalities of the rectum.   The [id number]  endoscope was introduced through the anus and advanced to the cecum, which was identified by both the appendix and ileocecal valve. No adverse events experienced.   The quality of the prep was excellent.  The instrument was then slowly withdrawn as the colon was fully examined.   COLON FINDINGS: A normal appearing cecum, ileocecal valve, and appendiceal orifice were identified.  The ascending, hepatic flexure, transverse, splenic flexure, descending, sigmoid colon and rectum appeared unremarkable.  No polyps or cancers were seen. Retroflexed views revealed no abnormalities. The time to cecum=2 minutes 32 seconds.  Withdrawal time=8 minutes 33 seconds.  The scope was withdrawn and the procedure completed. COMPLICATIONS: There were no complications.  ENDOSCOPIC IMPRESSION: Normal colon No polyps or cancers  RECOMMENDATIONS: You should continue to  follow colorectal cancer screening guidelines for "routine risk" patients with a repeat colonoscopy in 10 years.   eSigned:  Rachael Fee, MD 11/26/2013 8:45 AM

## 2013-11-26 NOTE — Progress Notes (Signed)
Procedure ends, to recovery, report given and VSS. 

## 2013-11-26 NOTE — Patient Instructions (Signed)
YOU HAD AN ENDOSCOPIC PROCEDURE TODAY AT THE [facility]: Refer to the procedure report that was given to you for any specific questions about what was found during the examination.  If the procedure report does not answer your questions, please call your gastroenterologist to clarify.  If you requested that your care partner not be given the details of your procedure findings, then the procedure report has been included in a sealed envelope for you to review at your convenience later.  YOU SHOULD EXPECT: Some feelings of bloating in the abdomen. Passage of more gas than usual.  Walking can help get rid of the air that was put into your GI tract during the procedure and reduce the bloating. If you had a lower endoscopy (such as a colonoscopy or flexible sigmoidoscopy) you may notice spotting of blood in your stool or on the toilet paper. If you underwent a bowel prep for your procedure, then you may not have a normal bowel movement for a few days.  DIET: Your first meal following the procedure should be a light meal and then it is ok to progress to your normal diet.  A half-sandwich or bowl of soup is an example of a good first meal.  Heavy or fried foods are harder to digest and may make you feel nauseous or bloated.  Likewise meals heavy in dairy and vegetables can cause extra gas to form and this can also increase the bloating.  Drink plenty of fluids but you should avoid alcoholic beverages for 24 hours.  ACTIVITY: Your care partner should take you home directly after the procedure.  You should plan to take it easy, moving slowly for the rest of the day.  You can resume normal activity the day after the procedure however you should NOT DRIVE or use heavy machinery for 24 hours (because of the sedation medicines used during the test).    SYMPTOMS TO REPORT IMMEDIATELY: A gastroenterologist can be reached at any hour.  During normal business hours, 8:30 AM to 5:00 PM Monday through Friday,  call [phone].  After hours and on weekends, please call the GI answering service at [phone] who will take a message and have the physician on call contact you.   Following lower endoscopy (colonoscopy or flexible sigmoidoscopy):  Excessive amounts of blood in the stool  Significant tenderness or worsening of abdominal pains  Swelling of the abdomen that is new, acute  Fever of 100F or higher  FOLLOW UP: Our staff will call the home number listed on your records the next business day following your procedure to check on you and address any questions or concerns that you may have at that time regarding the information given to you following your procedure. This is a courtesy call and so if there is no answer at the home number and we have not heard from you through the emergency physician on call, we will assume that you have returned to your regular daily activities without incident.  SIGNATURES/CONFIDENTIALITY: You and/or your care partner have signed paperwork which will be entered into your electronic medical record.  These signatures attest to the fact that that the information above on your After Visit Summary has been reviewed and is understood.  Full responsibility of the confidentiality of this discharge information lies with you and/or your care-partner.  Please continue your normal medications  Next colonoscopy in 10 years

## 2013-11-29 ENCOUNTER — Telehealth: Payer: Self-pay | Admitting: *Deleted

## 2013-11-29 NOTE — Telephone Encounter (Signed)
  Follow up Call-  Call back number 11/26/2013  Post procedure Call Back phone  # [phone]  Permission to leave phone message Yes     Patient questions:  Do you have a fever, pain , or abdominal swelling? no Pain Score  0 *  Have you tolerated food without any problems? yes  Have you been able to return to your normal activities? yes  Do you have any questions about your discharge instructions: Diet   no Medications  no Follow up visit  no  Do you have questions or concerns about your Care? no  Actions: * If pain score is 4 or above: No action needed, pain <4.

## 2014-03-31 ENCOUNTER — Emergency Department (INDEPENDENT_AMBULATORY_CARE_PROVIDER_SITE_OTHER)
Admission: EM | Admit: 2014-03-31 | Discharge: 2014-03-31 | Disposition: A | Discharge status: Home / Self Care | Payer: Commercial Managed Care - PPO | Source: Home / Self Care

## 2014-03-31 ENCOUNTER — Emergency Department (INDEPENDENT_AMBULATORY_CARE_PROVIDER_SITE_OTHER): Payer: Commercial Managed Care - PPO

## 2014-03-31 ENCOUNTER — Encounter: Payer: Self-pay | Admitting: Emergency Medicine

## 2014-03-31 DIAGNOSIS — S29012A Strain of muscle and tendon of back wall of thorax, initial encounter: Secondary | ICD-10-CM

## 2014-03-31 DIAGNOSIS — M539 Dorsopathy, unspecified: Secondary | ICD-10-CM

## 2014-03-31 DIAGNOSIS — S161XXA Strain of muscle, fascia and tendon at neck level, initial encounter: Secondary | ICD-10-CM

## 2014-03-31 DIAGNOSIS — S239XXA Sprain of unspecified parts of thorax, initial encounter: Secondary | ICD-10-CM

## 2014-03-31 DIAGNOSIS — S139XXA Sprain of joints and ligaments of unspecified parts of neck, initial encounter: Secondary | ICD-10-CM

## 2014-03-31 DIAGNOSIS — M5412 Radiculopathy, cervical region: Secondary | ICD-10-CM

## 2014-03-31 MED ORDER — PREDNISONE 20 MG PO TABS: 20.0000 mg | ORAL_TABLET | Freq: Two times a day (BID) | ORAL | Status: DC

## 2014-03-31 MED ORDER — CYCLOBENZAPRINE HCL 10 MG PO TABS: ORAL_TABLET | ORAL | Status: DC

## 2014-03-31 MED ORDER — HYDROCODONE-ACETAMINOPHEN 5-325 MG PO TABS: ORAL_TABLET | ORAL | Status: DC

## 2014-03-31 NOTE — ED Notes (Signed)
Reports injuring upper left scapular area at work today around 0900; started evaluation process in [facility] earlier this evening. Has complete ROM left arm; reports discomfort with extending head/neck leading to pulling pain in scapula.
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
## 2014-03-31 NOTE — Discharge Instructions (Signed)
Apply ice pack for 20 to 30 minutes, 3 to 4 times daily  Continue until pain decreases.  Wear soft cervical collar.  Avoid lifting.   Cervical Radiculopathy Cervical radiculopathy happens when a nerve in the neck is pinched or bruised by a slipped (herniated) disk or by arthritic changes in the bones of the cervical spine. This can occur due to an injury or as part of the normal aging process. Pressure on the cervical nerves can cause pain or numbness that runs from your neck all the way down into your arm and fingers. CAUSES  There are many possible causes, including:  Injury.  Muscle tightness in the neck from overuse.  Swollen, painful joints (arthritis).  Breakdown or degeneration in the bones and joints of the spine (spondylosis) due to aging.  Bone spurs that may develop near the cervical nerves. SYMPTOMS  Symptoms include pain, weakness, or numbness in the affected arm and hand. Pain can be severe or irritating. Symptoms may be worse when extending or turning the neck. DIAGNOSIS  Your caregiver will ask about your symptoms and do a physical exam. He or she may test your strength and reflexes. X-rays, CT scans, and MRI scans may be needed in cases of injury or if the symptoms do not go away after a period of time. Electromyography (EMG) or nerve conduction testing may be done to study how your nerves and muscles are working. TREATMENT  Your caregiver may recommend certain exercises to help relieve your symptoms. Cervical radiculopathy can, and often does, get better with time and treatment. If your problems continue, treatment options may include:  Wearing a soft collar for short periods of time.  Physical therapy to strengthen the neck muscles.  Medicines, such as nonsteroidal anti-inflammatory drugs (NSAIDs), oral corticosteroids, or spinal injections.  Surgery. Different types of surgery may be done depending on the cause of your problems. HOME CARE INSTRUCTIONS   Put ice on  the affected area.  Put ice in a plastic bag.  Place a towel between your skin and the bag.  Leave the ice on for 15-20 minutes, 03-04 times a day or as directed by your caregiver.  If ice does not help, you can try using heat. Take a warm shower or bath, or use a hot water bottle as directed by your caregiver.  You may try a gentle neck and shoulder massage.  Use a flat pillow when you sleep.  Only take over-the-counter or prescription medicines for pain, discomfort, or fever as directed by your caregiver.  If physical therapy was prescribed, follow your caregiver's directions.  If a soft collar was prescribed, use it as directed. SEEK IMMEDIATE MEDICAL CARE IF:   Your pain gets much worse and cannot be controlled with medicines.  You have weakness or numbness in your hand, arm, face, or leg.  You have a high fever or a stiff, rigid neck.  You lose bowel or bladder control (incontinence).  You have trouble with walking, balance, or speaking. MAKE SURE YOU:   Understand these instructions.  Will watch your condition.  Will get help right away if you are not doing well or get worse. Document Released: 04/23/2001 Document Revised: 10/21/2011 Document Reviewed: 03/12/2011 [facility] Patient Information 2015 [place], [place]. This information is not intended to replace advice given to you by your health care provider. Make sure you discuss any questions you have with your health care provider.

## 2014-04-05 ENCOUNTER — Encounter: Payer: Self-pay | Admitting: Sports Medicine

## 2014-04-05 ENCOUNTER — Ambulatory Visit (INDEPENDENT_AMBULATORY_CARE_PROVIDER_SITE_OTHER): Payer: Commercial Managed Care - PPO | Admitting: Sports Medicine

## 2014-04-05 VITALS — BP 134/92 | HR 81 | Ht 75.0 in | Wt 237.0 lb

## 2014-04-05 DIAGNOSIS — M5030 Other cervical disc degeneration, unspecified cervical region: Secondary | ICD-10-CM | POA: Insufficient documentation

## 2014-04-05 MED ORDER — AMITRIPTYLINE HCL 50 MG PO TABS: ORAL_TABLET | ORAL | Status: DC

## 2014-04-05 NOTE — Assessment & Plan Note (Signed)
Jeremiah Webb has left-sided C7 radiculitis likely from a C6-C7 disc extrusion, which represents adjacent level disease after his C4-C6 fusion. We are going to start aggressively per patient request, amitriptyline, formal physical therapy, MRI for interventional injection plan. Return to see me to go for MRI results.
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
## 2014-04-06 ENCOUNTER — Telehealth: Payer: Self-pay | Admitting: *Deleted

## 2014-04-06 NOTE — Telephone Encounter (Signed)
Approval for MRI good from 8/25-9/25/15. [phone]. Spoke with  Roosevelt Locks. @ [facility]

## 2014-04-07 ENCOUNTER — Encounter (INDEPENDENT_AMBULATORY_CARE_PROVIDER_SITE_OTHER): Payer: Self-pay

## 2014-04-07 ENCOUNTER — Ambulatory Visit (HOSPITAL_COMMUNITY)
Admission: RE | Admit: 2014-04-07 | Discharge: 2014-04-07 | Disposition: A | Discharge status: Home / Self Care | Payer: Worker's Compensation | Source: Ambulatory Visit | Attending: Sports Medicine | Admitting: Sports Medicine

## 2014-04-07 DIAGNOSIS — M5020 Other cervical disc displacement, unspecified cervical region: Secondary | ICD-10-CM | POA: Insufficient documentation

## 2014-04-07 DIAGNOSIS — M5412 Radiculopathy, cervical region: Secondary | ICD-10-CM | POA: Insufficient documentation

## 2014-04-07 DIAGNOSIS — M5030 Other cervical disc degeneration, unspecified cervical region: Secondary | ICD-10-CM

## 2014-04-08 ENCOUNTER — Telehealth: Payer: Self-pay | Admitting: Sports Medicine

## 2014-04-08 NOTE — Telephone Encounter (Signed)
Called.

## 2014-04-08 NOTE — Telephone Encounter (Signed)
Mr Reinholtz called. He wants to know results of his MRI for PT purposes.  Thank you

## 2014-04-08 NOTE — Telephone Encounter (Signed)
Spoke to patrient and read to him what the findings were on the MRI. Rhonda Cunningham,CMA

## 2014-04-12 ENCOUNTER — Telehealth: Payer: Self-pay

## 2014-04-12 NOTE — Telephone Encounter (Signed)
Pt says we prescribed an antibiotic for him for some "spots" on his foot a while back.  He wants to know if we can refill this for him because he says he has the same thing again.  I told him if it had been a long time, that he would probably have to come back in, but he still thinks this can be done because its the same thing.  [phone]
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
## 2014-04-13 ENCOUNTER — Encounter: Payer: Self-pay | Admitting: Sports Medicine

## 2014-04-13 ENCOUNTER — Ambulatory Visit (INDEPENDENT_AMBULATORY_CARE_PROVIDER_SITE_OTHER): Payer: Worker's Compensation | Admitting: Sports Medicine

## 2014-04-13 VITALS — BP 145/94 | HR 71 | Ht 75.0 in | Wt 236.0 lb

## 2014-04-13 DIAGNOSIS — M5030 Other cervical disc degeneration, unspecified cervical region: Secondary | ICD-10-CM

## 2014-04-13 MED ORDER — HYDROCODONE-ACETAMINOPHEN 10-325 MG PO TABS: 1.0000 | ORAL_TABLET | Freq: Three times a day (TID) | ORAL | Status: DC | PRN

## 2014-04-13 NOTE — Assessment & Plan Note (Addendum)
Florentino does have a new disc protrusion at C7-T1 level likely affecting the left C8 nerve root. Additional refill of Norco 10/325.  I would like him to see his neurosurgeon, we are also going to proceed with a left-sided C7-T1 interlaminar epidural. I myself would like to see him back approximately 2-3 weeks after the epidural to evaluate response, and he will also followup with his neurosurgeon.

## 2014-04-13 NOTE — Progress Notes (Signed)
  Subjective:    CC: Follow up  HPI: Cervical degenerative disc disease: Post C4-5 and C5-6 posterior fusion in the distant past, this was for right-sided symptoms, lately has been having left C8 nerve root symptoms, pain is moderate, persistent and he has failed several rehabilitation but also steroids, NSAIDs, and muscle relaxants. He did not do well with amitriptyline.  Past medical history, Surgical history, Family history not pertinant except as noted below, Social history, Allergies, and medications have been entered into the medical record, reviewed, and no changes needed.   Review of Systems: No fevers, chills, night sweats, weight loss, chest pain, or shortness of breath.   Objective:    General: Well Developed, well nourished, and in no acute distress.  Neuro: Alert and oriented x3, extra-ocular muscles intact, sensation grossly intact.  HEENT: Normocephalic, atraumatic, pupils equal round reactive to light, neck supple, no masses, no lymphadenopathy, thyroid nonpalpable.  Skin: Warm and dry, no rashes. Cardiac: Regular rate and rhythm, no murmurs rubs or gallops, no lower extremity edema.  Respiratory: Clear to auscultation bilaterally. Not using accessory muscles, speaking in full sentences.  MRI shows a C7-T1 disc protrusion with left-sided foraminal component likely affecting the left C8 nerve root.  Impression and Recommendations:

## 2014-04-14 ENCOUNTER — Ambulatory Visit: Payer: Commercial Managed Care - PPO | Admitting: Sports Medicine

## 2014-04-22 ENCOUNTER — Telehealth: Payer: Self-pay

## 2014-04-22 ENCOUNTER — Other Ambulatory Visit: Payer: Self-pay

## 2014-04-22 NOTE — Telephone Encounter (Signed)
Jeremiah Webb has a epidural injection scheduled and there needs to a prior approval from [employer]. I have called Carollee Herter [phone] ext: [phone] and left a message asking for an urgent call back. I also called Stacy Gardner [phone] ext: [phone] asking for an urgent call back. Both the calls was in the morning. I have not received a return call.

## 2014-04-27 ENCOUNTER — Telehealth: Payer: Self-pay | Admitting: *Deleted

## 2014-04-27 NOTE — Telephone Encounter (Signed)
Ok, let me know what they say and if a peer to peer is needed.

## 2014-04-27 NOTE — Telephone Encounter (Signed)
I spoke with Maren Beach @ [address] Work[employer] insurance adjuster regarding epidural that is scheduled for tomorrow at [facility]. She said that this claim was still under investigation and was denied for epidural. I called and left a msg on Jennifer's vm @ [facility] and I also left a vm for patient to return my call. Corliss Skains, CMA

## 2014-04-27 NOTE — Telephone Encounter (Signed)
See other telephone note.  

## 2014-04-27 NOTE — Telephone Encounter (Signed)
Spoke with Jeremiah Webb @ [facility] and they are going to pursue epidural tomorrow and it will be billed to his private [facility] and his insurance does not require prior auth. Gershon Crane CMA

## 2014-04-28 ENCOUNTER — Other Ambulatory Visit: Payer: Self-pay

## 2014-05-11 ENCOUNTER — Other Ambulatory Visit: Payer: Self-pay

## 2014-08-31 ENCOUNTER — Other Ambulatory Visit: Payer: Self-pay | Admitting: Neurological Surgery

## 2014-09-09 ENCOUNTER — Encounter: Payer: Self-pay | Admitting: Sports Medicine

## 2014-09-09 ENCOUNTER — Ambulatory Visit (INDEPENDENT_AMBULATORY_CARE_PROVIDER_SITE_OTHER): Payer: Commercial Managed Care - PPO | Admitting: Sports Medicine

## 2014-09-09 DIAGNOSIS — M5030 Other cervical disc degeneration, unspecified cervical region: Secondary | ICD-10-CM

## 2014-09-09 MED ORDER — GABAPENTIN 300 MG PO CAPS: ORAL_CAPSULE | ORAL | Status: DC

## 2014-09-09 NOTE — Progress Notes (Signed)
  Subjective:    CC: follow-up  HPI: Jeremiah Webb is post-C4-5 and C6-7 posterior fusions by neurosurgery, subsequently he started to have pain running down his left arm in a C8 distribution suggestive of adjacent level disease. We did order an epidural which she has not had, he is amenable to trying neuropathic-type medications. He is also scheduled for an additional surgery at the C7-T1 level next month. Symptoms are mild, persistent.  Past medical history, Surgical history, Family history not pertinant except as noted below, Social history, Allergies, and medications have been entered into the medical record, reviewed, and no changes needed.   Review of Systems: No fevers, chills, night sweats, weight loss, chest pain, or shortness of breath.   Objective:    General: Well Developed, well nourished, and in no acute distress.  Neuro: Alert and oriented x3, extra-ocular muscles intact, sensation grossly intact.  HEENT: Normocephalic, atraumatic, pupils equal round reactive to light, neck supple, no masses, no lymphadenopathy, thyroid nonpalpable.  Skin: Warm and dry, no rashes. Cardiac: Regular rate and rhythm, no murmurs rubs or gallops, no lower extremity edema.  Respiratory: Clear to auscultation bilaterally. Not using accessory muscles, speaking in full sentences. Neck: Negative spurling's Full neck range of motion Grip strength and sensation normal in bilateral hands Strength good C4 to T1 distribution No sensory change to C4 to T1 Reflexes normal  Impression and Recommendations:

## 2014-09-09 NOTE — Assessment & Plan Note (Signed)
Jeremiah Webb does have a new disc protrusion at C7-T1 level this likely represents adjacent level disease. I'm going to add gabapentin, he is scheduled for a discectomy with neurosurgery. He did not have his epidural and I have asked him to discuss this with his neurosurgeon before proceeding to the operating room. Return in one month.

## 2014-09-12 ENCOUNTER — Other Ambulatory Visit (HOSPITAL_COMMUNITY): Payer: Self-pay

## 2014-09-14 NOTE — Pre-Procedure Instructions (Signed)
Jeremiah Webb  09/14/2014   Your procedure is scheduled on:  09/20/14  Report to [facility] cone short stay admitting at 530 AM.  Call this number if you have problems the morning of surgery: [phone]   Remember:   Do not eat food or drink liquids after midnight.   Take these medicines the morning of surgery with A SIP OF WATER: ?    STOP all herbel meds, nsaids (aleve,naproxen,advil,ibuprofen) starting now including vitamins, aspirin   Do not wear jewelry, make-up or nail polish.  Do not wear lotions, powders, or perfumes. You may wear deodorant.  Do not shave 48 hours prior to surgery. Men may shave face and neck.  Do not bring valuables to the hospital.  [facility] is not responsible                  for any belongings or valuables.               Contacts, dentures or bridgework may not be worn into surgery.  Leave suitcase in the car. After surgery it may be brought to your room.  For patients admitted to the hospital, discharge time is determined by your                treatment team.               Patients discharged the day of surgery will not be allowed to drive  home.  Name and phone number of your driver:   Special Instructions:  Special Instructions: [facility] - Preparing for Surgery  Before surgery, you can play an important role.  Because skin is not sterile, your skin needs to be as free of germs as possible.  You can reduce the number of germs on you skin by washing with CHG (chlorahexidine gluconate) soap before surgery.  CHG is an antiseptic cleaner which kills germs and bonds with the skin to continue killing germs even after washing.  Please DO NOT use if you have an allergy to CHG or antibacterial soaps.  If your skin becomes reddened/irritated stop using the CHG and inform your nurse when you arrive at Short Stay.  Do not shave (including legs and underarms) for at least 48 hours prior to the first CHG shower.  You may shave your face.  Please follow these  instructions carefully:   1.  Shower with CHG Soap the night before surgery and the morning of Surgery.  2.  If you choose to wash your hair, wash your hair first as usual with your normal shampoo.  3.  After you shampoo, rinse your hair and body thoroughly to remove the Shampoo.  4.  Use CHG as you would any other liquid soap.  You can apply chg directly  to the skin and wash gently with scrungie or a clean washcloth.  5.  Apply the CHG Soap to your body ONLY FROM THE NECK DOWN.  Do not use on open wounds or open sores.  Avoid contact with your eyes ears, mouth and genitals (private parts).  Wash genitals (private parts)       with your normal soap.  6.  Wash thoroughly, paying special attention to the area where your surgery will be performed.  7.  Thoroughly rinse your body with warm water from the neck down.  8.  DO NOT shower/wash with your normal soap after using and rinsing off the CHG Soap.  9.  Pat yourself dry with a clean  towel.            10.  Wear clean pajamas.            11.  Place clean sheets on your bed the night of your first shower and do not sleep with pets.  Day of Surgery  Do not apply any lotions/deodorants the morning of surgery.  Please wear clean clothes to the hospital/surgery center.   Please read over the following fact sheets that you were given: Pain Booklet, Coughing and Deep Breathing, Blood Transfusion Information, MRSA Information and Surgical Site Infection Prevention

## 2014-09-15 ENCOUNTER — Inpatient Hospital Stay (HOSPITAL_COMMUNITY)
Admission: RE | Admit: 2014-09-15 | Discharge: 2014-09-15 | Disposition: A | Discharge status: Home / Self Care | Payer: Self-pay | Source: Ambulatory Visit

## 2014-09-19 ENCOUNTER — Encounter (HOSPITAL_COMMUNITY)
Admission: RE | Admit: 2014-09-19 | Discharge: 2014-09-19 | Disposition: A | Discharge status: Home / Self Care | Payer: Commercial Managed Care - PPO | Source: Ambulatory Visit | Attending: Neurological Surgery | Admitting: Neurological Surgery

## 2014-09-19 ENCOUNTER — Encounter (HOSPITAL_COMMUNITY): Payer: Self-pay

## 2014-09-19 DIAGNOSIS — M5013 Cervical disc disorder with radiculopathy, cervicothoracic region: Secondary | ICD-10-CM | POA: Diagnosis present

## 2014-09-19 HISTORY — DX: Hypothyroidism, unspecified: E03.9

## 2014-09-19 LAB — SURGICAL PCR SCREEN
MRSA, PCR: NEGATIVE
Staphylococcus aureus: NEGATIVE

## 2014-09-19 LAB — BASIC METABOLIC PANEL
ANION GAP: 7 (ref 5–15)
BUN: 15 mg/dL (ref 6–23)
CHLORIDE: 107 mmol/L (ref 96–112)
CO2: 27 mmol/L (ref 19–32)
Calcium: 9.2 mg/dL (ref 8.4–10.5)
Creatinine, Ser: 1.33 mg/dL (ref 0.50–1.35)
GFR calc non Af Amer: 60 mL/min — ABNORMAL LOW (ref >90)
GFR, EST AFRICAN AMERICAN: 70 mL/min — AB (ref >90)
GLUCOSE: 128 mg/dL — AB (ref 70–99)
Potassium: 3.9 mmol/L (ref 3.5–5.1)
SODIUM: 141 mmol/L (ref 135–145)

## 2014-09-19 LAB — CBC
HCT: 40.4 % (ref 39.0–52.0)
HEMOGLOBIN: 13.8 g/dL (ref 13.0–17.0)
MCH: 29.6 pg (ref 26.0–34.0)
MCHC: 34.2 g/dL (ref 30.0–36.0)
MCV: 86.7 fL (ref 78.0–100.0)
Platelets: 274 10*3/uL (ref 150–400)
RBC: 4.66 MIL/uL (ref 4.22–5.81)
RDW: 11.9 % (ref 11.5–15.5)
WBC: 8.1 10*3/uL (ref 4.0–10.5)

## 2014-09-19 MED ORDER — CEFAZOLIN SODIUM-DEXTROSE 2-3 GM-% IV SOLR
2.0000 g | INTRAVENOUS | Status: AC
  Administered 2014-09-20: 2 g via INTRAVENOUS
  Filled 2014-09-19: qty 50

## 2014-09-19 NOTE — Pre-Procedure Instructions (Signed)
Jeremiah Webb  09/19/2014   Your procedure is scheduled on:  Tues, Feb 9 @ 7:30 AM  Report to [facility]  at 5:30 AM.  Call this number if you have problems the morning of surgery: [phone]   Remember:   Do not eat food or drink liquids after midnight.   Take these medicines the morning of surgery with A SIP OF WATER: Gabapentin(Neurontin) and Levothyroxine(Synthroid)              No Goody's,BC's,Aleve,Aspirin,Ibuprofen,Fish Oil,or any Herbal Medications   Do not wear jewelry  Do not wear lotions, powders, or colognes. You may wear deodorant.  Men may shave face and neck.  Do not bring valuables to the hospital.  [facility] is not responsible                  for any belongings or valuables.               Contacts, dentures or bridgework may not be worn into surgery.  Leave suitcase in the car. After surgery it may be brought to your room.  For patients admitted to the hospital, discharge time is determined by your                treatment team.               Patients discharged the day of surgery will not be allowed to drive  home.    Special Instructions:   - Preparing for Surgery  Before surgery, you can play an important role.  Because skin is not sterile, your skin needs to be as free of germs as possible.  You can reduce the number of germs on you skin by washing with CHG (chlorahexidine gluconate) soap before surgery.  CHG is an antiseptic cleaner which kills germs and bonds with the skin to continue killing germs even after washing.  Please DO NOT use if you have an allergy to CHG or antibacterial soaps.  If your skin becomes reddened/irritated stop using the CHG and inform your nurse when you arrive at Short Stay.  Do not shave (including legs and underarms) for at least 48 hours prior to the first CHG shower.  You may shave your face.  Please follow these instructions carefully:   1.  Shower with CHG Soap the night before surgery and the                                 morning of Surgery.  2.  If you choose to wash your hair, wash your hair first as usual with your       normal shampoo.  3.  After you shampoo, rinse your hair and body thoroughly to remove the                      Shampoo.  4.  Use CHG as you would any other liquid soap.  You can apply chg directly       to the skin and wash gently with scrungie or a clean washcloth.  5.  Apply the CHG Soap to your body ONLY FROM THE NECK DOWN.        Do not use on open wounds or open sores.  Avoid contact with your eyes,       ears, mouth and genitals (private parts).  Wash genitals (private parts)  with your normal soap.  6.  Wash thoroughly, paying special attention to the area where your surgery        will be performed.  7.  Thoroughly rinse your body with warm water from the neck down.  8.  DO NOT shower/wash with your normal soap after using and rinsing off       the CHG Soap.  9.  Pat yourself dry with a clean towel.            10.  Wear clean pajamas.            11.  Place clean sheets on your bed the night of your first shower and do not        sleep with pets.  Day of Surgery  Do not apply any lotions/deoderants the morning of surgery.  Please wear clean clothes to the hospital/surgery center.     Please read over the following fact sheets that you were given: Pain Booklet, Coughing and Deep Breathing, MRSA Information and Surgical Site Infection Prevention

## 2014-09-20 ENCOUNTER — Encounter (HOSPITAL_COMMUNITY)
Admission: RE | Disposition: A | Discharge status: Home / Self Care | Payer: Self-pay | Source: Ambulatory Visit | Attending: Neurological Surgery

## 2014-09-20 ENCOUNTER — Observation Stay (HOSPITAL_COMMUNITY): Payer: Commercial Managed Care - PPO

## 2014-09-20 ENCOUNTER — Ambulatory Visit (HOSPITAL_COMMUNITY): Payer: Commercial Managed Care - PPO | Admitting: Anesthesiology

## 2014-09-20 ENCOUNTER — Ambulatory Visit (HOSPITAL_COMMUNITY)
Admission: RE | Admit: 2014-09-20 | Discharge: 2014-09-20 | Disposition: A | Discharge status: Home / Self Care | Payer: Commercial Managed Care - PPO | Source: Ambulatory Visit | Attending: Neurological Surgery | Admitting: Neurological Surgery

## 2014-09-20 ENCOUNTER — Ambulatory Visit (HOSPITAL_COMMUNITY): Payer: Commercial Managed Care - PPO

## 2014-09-20 ENCOUNTER — Encounter (HOSPITAL_COMMUNITY): Payer: Self-pay | Admitting: *Deleted

## 2014-09-20 DIAGNOSIS — M5013 Cervical disc disorder with radiculopathy, cervicothoracic region: Secondary | ICD-10-CM | POA: Diagnosis not present

## 2014-09-20 DIAGNOSIS — IMO0002 Reserved for concepts with insufficient information to code with codable children: Secondary | ICD-10-CM | POA: Diagnosis present

## 2014-09-20 DIAGNOSIS — Z452 Encounter for adjustment and management of vascular access device: Secondary | ICD-10-CM

## 2014-09-20 HISTORY — PX: POSTERIOR CERVICAL LAMINECTOMY WITH MET- RX: SHX6035

## 2014-09-20 SURGERY — POSTERIOR CERVICAL LAMINECTOMY WITH MET- RX
Anesthesia: General | Site: Back | Laterality: Left

## 2014-09-20 MED ORDER — ONDANSETRON HCL 4 MG/2ML IJ SOLN
INTRAMUSCULAR | Status: AC
  Filled 2014-09-20: qty 2

## 2014-09-20 MED ORDER — DIPHENHYDRAMINE HCL 50 MG/ML IJ SOLN
INTRAMUSCULAR | Status: AC
  Filled 2014-09-20: qty 1

## 2014-09-20 MED ORDER — NEOSTIGMINE METHYLSULFATE 10 MG/10ML IV SOLN
INTRAVENOUS | Status: DC | PRN
  Administered 2014-09-20: 5 mg via INTRAVENOUS

## 2014-09-20 MED ORDER — HEMOSTATIC AGENTS (NO CHARGE) OPTIME
TOPICAL | Status: DC | PRN
  Administered 2014-09-20: 1 via TOPICAL

## 2014-09-20 MED ORDER — NEOSTIGMINE METHYLSULFATE 10 MG/10ML IV SOLN
INTRAVENOUS | Status: AC
  Filled 2014-09-20: qty 1

## 2014-09-20 MED ORDER — SODIUM CHLORIDE 0.9 % IR SOLN
Status: DC | PRN
  Administered 2014-09-20: 500 mL

## 2014-09-20 MED ORDER — LIDOCAINE-EPINEPHRINE 1 %-1:100000 IJ SOLN
INTRAMUSCULAR | Status: DC | PRN
  Administered 2014-09-20: 10 mL

## 2014-09-20 MED ORDER — DEXAMETHASONE SODIUM PHOSPHATE 10 MG/ML IJ SOLN
INTRAMUSCULAR | Status: DC | PRN
  Administered 2014-09-20: 10 mg via INTRAVENOUS

## 2014-09-20 MED ORDER — MENTHOL 3 MG MT LOZG: 1.0000 | LOZENGE | OROMUCOSAL | Status: DC | PRN

## 2014-09-20 MED ORDER — DOCUSATE SODIUM 100 MG PO CAPS
100.0000 mg | ORAL_CAPSULE | Freq: Two times a day (BID) | ORAL | Status: DC
  Administered 2014-09-20: 100 mg via ORAL
  Filled 2014-09-20: qty 1

## 2014-09-20 MED ORDER — BISACODYL 10 MG RE SUPP: 10.0000 mg | Freq: Every day | RECTAL | Status: DC | PRN

## 2014-09-20 MED ORDER — MIDAZOLAM HCL 2 MG/2ML IJ SOLN
INTRAMUSCULAR | Status: AC
  Filled 2014-09-20: qty 2

## 2014-09-20 MED ORDER — DIAZEPAM 5 MG PO TABS: 5.0000 mg | ORAL_TABLET | Freq: Four times a day (QID) | ORAL | Status: DC | PRN

## 2014-09-20 MED ORDER — SUCCINYLCHOLINE CHLORIDE 20 MG/ML IJ SOLN
INTRAMUSCULAR | Status: AC
  Filled 2014-09-20: qty 1

## 2014-09-20 MED ORDER — LACTATED RINGERS IV SOLN
INTRAVENOUS | Status: DC | PRN
  Administered 2014-09-20 (×2): via INTRAVENOUS

## 2014-09-20 MED ORDER — PHENOL 1.4 % MT LIQD: 1.0000 | OROMUCOSAL | Status: DC | PRN

## 2014-09-20 MED ORDER — SODIUM CHLORIDE 0.9 % IJ SOLN: 3.0000 mL | Freq: Two times a day (BID) | INTRAMUSCULAR | Status: DC

## 2014-09-20 MED ORDER — GLYCOPYRROLATE 0.2 MG/ML IJ SOLN
INTRAMUSCULAR | Status: DC | PRN
  Administered 2014-09-20: 0.6 mg via INTRAVENOUS

## 2014-09-20 MED ORDER — KETOROLAC TROMETHAMINE 15 MG/ML IJ SOLN: 15.0000 mg | Freq: Four times a day (QID) | INTRAMUSCULAR | Status: DC

## 2014-09-20 MED ORDER — PROMETHAZINE HCL 25 MG/ML IJ SOLN: 6.2500 mg | INTRAMUSCULAR | Status: DC | PRN

## 2014-09-20 MED ORDER — STERILE WATER FOR INJECTION IJ SOLN
INTRAMUSCULAR | Status: AC
  Filled 2014-09-20: qty 20

## 2014-09-20 MED ORDER — ACETAMINOPHEN 650 MG RE SUPP: 650.0000 mg | RECTAL | Status: DC | PRN

## 2014-09-20 MED ORDER — EPHEDRINE SULFATE 50 MG/ML IJ SOLN
INTRAMUSCULAR | Status: AC
  Filled 2014-09-20: qty 1

## 2014-09-20 MED ORDER — ROCURONIUM BROMIDE 100 MG/10ML IV SOLN
INTRAVENOUS | Status: DC | PRN
  Administered 2014-09-20: 10 mg via INTRAVENOUS
  Administered 2014-09-20: 50 mg via INTRAVENOUS
  Administered 2014-09-20: 10 mg via INTRAVENOUS

## 2014-09-20 MED ORDER — PROPOFOL 10 MG/ML IV BOLUS
INTRAVENOUS | Status: DC | PRN
  Administered 2014-09-20: 200 mg via INTRAVENOUS
  Administered 2014-09-20: 50 mg via INTRAVENOUS

## 2014-09-20 MED ORDER — HYDROCODONE-ACETAMINOPHEN 5-325 MG PO TABS: 1.0000 | ORAL_TABLET | ORAL | Status: DC | PRN

## 2014-09-20 MED ORDER — GLYCOPYRROLATE 0.2 MG/ML IJ SOLN
INTRAMUSCULAR | Status: AC
  Filled 2014-09-20: qty 3

## 2014-09-20 MED ORDER — HYDROMORPHONE HCL 1 MG/ML IJ SOLN
0.2500 mg | INTRAMUSCULAR | Status: DC | PRN
  Administered 2014-09-20: 0.5 mg via INTRAVENOUS

## 2014-09-20 MED ORDER — DIPHENHYDRAMINE HCL 50 MG/ML IJ SOLN
INTRAMUSCULAR | Status: DC | PRN
  Administered 2014-09-20: 12.5 mg via INTRAVENOUS

## 2014-09-20 MED ORDER — FENTANYL CITRATE 0.05 MG/ML IJ SOLN
INTRAMUSCULAR | Status: DC | PRN
  Administered 2014-09-20: 50 ug via INTRAVENOUS
  Administered 2014-09-20 (×2): 100 ug via INTRAVENOUS

## 2014-09-20 MED ORDER — SODIUM CHLORIDE 0.9 % IJ SOLN: 3.0000 mL | INTRAMUSCULAR | Status: DC | PRN

## 2014-09-20 MED ORDER — ONDANSETRON HCL 4 MG/2ML IJ SOLN: 4.0000 mg | INTRAMUSCULAR | Status: DC | PRN

## 2014-09-20 MED ORDER — DEXAMETHASONE SODIUM PHOSPHATE 10 MG/ML IJ SOLN
INTRAMUSCULAR | Status: AC
  Filled 2014-09-20: qty 1

## 2014-09-20 MED ORDER — METHOCARBAMOL 500 MG PO TABS
500.0000 mg | ORAL_TABLET | Freq: Four times a day (QID) | ORAL | Status: DC | PRN
Start: 2014-09-20 — End: 2014-09-20

## 2014-09-20 MED ORDER — PHENYLEPHRINE HCL 10 MG/ML IJ SOLN
10.0000 mg | INTRAVENOUS | Status: DC | PRN
  Administered 2014-09-20: 10 ug/min via INTRAVENOUS

## 2014-09-20 MED ORDER — LACTATED RINGERS IV SOLN: INTRAVENOUS | Status: DC | PRN

## 2014-09-20 MED ORDER — THROMBIN 5000 UNITS EX SOLR
CUTANEOUS | Status: DC | PRN
  Administered 2014-09-20: 10 mL via TOPICAL

## 2014-09-20 MED ORDER — OXYCODONE-ACETAMINOPHEN 5-325 MG PO TABS: 1.0000 | ORAL_TABLET | ORAL | Status: DC | PRN

## 2014-09-20 MED ORDER — LACTATED RINGERS IV SOLN
INTRAVENOUS | Status: DC | PRN
  Administered 2014-09-20 (×2): via INTRAVENOUS

## 2014-09-20 MED ORDER — MORPHINE SULFATE 2 MG/ML IJ SOLN: 1.0000 mg | INTRAMUSCULAR | Status: DC | PRN

## 2014-09-20 MED ORDER — SENNA 8.6 MG PO TABS
1.0000 | ORAL_TABLET | Freq: Two times a day (BID) | ORAL | Status: DC
  Administered 2014-09-20: 8.6 mg via ORAL
  Filled 2014-09-20: qty 1

## 2014-09-20 MED ORDER — SODIUM CHLORIDE 0.9 % IV SOLN: 250.0000 mL | INTRAVENOUS | Status: DC

## 2014-09-20 MED ORDER — PHENYLEPHRINE HCL 10 MG/ML IJ SOLN
INTRAMUSCULAR | Status: AC
  Filled 2014-09-20: qty 1

## 2014-09-20 MED ORDER — ONDANSETRON HCL 4 MG/2ML IJ SOLN
INTRAMUSCULAR | Status: DC | PRN
  Administered 2014-09-20: 4 mg via INTRAVENOUS

## 2014-09-20 MED ORDER — PROPOFOL 10 MG/ML IV BOLUS
INTRAVENOUS | Status: AC
  Filled 2014-09-20: qty 20

## 2014-09-20 MED ORDER — MIDAZOLAM HCL 5 MG/5ML IJ SOLN
INTRAMUSCULAR | Status: DC | PRN
  Administered 2014-09-20: 2 mg via INTRAVENOUS

## 2014-09-20 MED ORDER — LIDOCAINE HCL (CARDIAC) 20 MG/ML IV SOLN
INTRAVENOUS | Status: DC | PRN
  Administered 2014-09-20: 50 mg via INTRAVENOUS

## 2014-09-20 MED ORDER — 0.9 % SODIUM CHLORIDE (POUR BTL) OPTIME
TOPICAL | Status: DC | PRN
  Administered 2014-09-20: 1000 mL

## 2014-09-20 MED ORDER — LEVOTHYROXINE SODIUM 100 MCG PO TABS
100.0000 ug | ORAL_TABLET | Freq: Every day | ORAL | Status: DC
  Filled 2014-09-20: qty 1

## 2014-09-20 MED ORDER — HYDROMORPHONE HCL 1 MG/ML IJ SOLN
INTRAMUSCULAR | Status: AC
  Filled 2014-09-20: qty 1

## 2014-09-20 MED ORDER — METHOCARBAMOL 1000 MG/10ML IJ SOLN
500.0000 mg | Freq: Four times a day (QID) | INTRAVENOUS | Status: DC | PRN
  Filled 2014-09-20: qty 5

## 2014-09-20 MED ORDER — FENTANYL CITRATE 0.05 MG/ML IJ SOLN
INTRAMUSCULAR | Status: AC
  Filled 2014-09-20: qty 5

## 2014-09-20 MED ORDER — ARTIFICIAL TEARS OP OINT
TOPICAL_OINTMENT | OPHTHALMIC | Status: AC
  Filled 2014-09-20: qty 3.5

## 2014-09-20 MED ORDER — THROMBIN 5000 UNITS EX SOLR
CUTANEOUS | Status: DC | PRN
  Administered 2014-09-20 (×2): 5000 [IU] via TOPICAL

## 2014-09-20 MED ORDER — GABAPENTIN 300 MG PO CAPS: 300.0000 mg | ORAL_CAPSULE | Freq: Three times a day (TID) | ORAL | Status: DC

## 2014-09-20 MED ORDER — POLYETHYLENE GLYCOL 3350 17 G PO PACK
17.0000 g | PACK | Freq: Every day | ORAL | Status: DC | PRN
  Filled 2014-09-20: qty 1

## 2014-09-20 MED ORDER — ALUM & MAG HYDROXIDE-SIMETH 200-200-20 MG/5ML PO SUSP: 30.0000 mL | Freq: Four times a day (QID) | ORAL | Status: DC | PRN

## 2014-09-20 MED ORDER — OXYCODONE-ACETAMINOPHEN 5-325 MG PO TABS
1.0000 | ORAL_TABLET | ORAL | Status: DC | PRN
  Administered 2014-09-20 (×2): 2 via ORAL
  Filled 2014-09-20 (×2): qty 2

## 2014-09-20 MED ORDER — ACETAMINOPHEN 325 MG PO TABS: 650.0000 mg | ORAL_TABLET | ORAL | Status: DC | PRN

## 2014-09-20 MED ORDER — ROCURONIUM BROMIDE 50 MG/5ML IV SOLN
INTRAVENOUS | Status: AC
  Filled 2014-09-20: qty 3

## 2014-09-20 SURGICAL SUPPLY — 49 items
BAG DECANTER FOR FLEXI CONT (MISCELLANEOUS) ×2 IMPLANT
BLADE CLIPPER SURG (BLADE) IMPLANT
BLADE SURG 15 STRL LF DISP TIS (BLADE) ×1 IMPLANT
BLADE SURG 15 STRL SS (BLADE) ×2
CANISTER SUCT 3000ML (MISCELLANEOUS) ×2 IMPLANT
CONT SPEC 4OZ CLIKSEAL STRL BL (MISCELLANEOUS) ×2 IMPLANT
DECANTER SPIKE VIAL GLASS SM (MISCELLANEOUS) ×2 IMPLANT
DRAPE C-ARM 42X72 X-RAY (DRAPES) IMPLANT
DRAPE LAPAROTOMY 100X72 PEDS (DRAPES) ×2 IMPLANT
DRAPE MICROSCOPE LEICA (MISCELLANEOUS) ×2 IMPLANT
DRAPE POUCH INSTRU U-SHP 10X18 (DRAPES) ×2 IMPLANT
DRAPE SURG IRRIG POUCH 19X23 (DRAPES) ×2 IMPLANT
DURAPREP 6ML APPLICATOR 50/CS (WOUND CARE) ×2 IMPLANT
ELECT BLADE 6.5 EXT (BLADE) ×2 IMPLANT
ELECT REM PT RETURN 9FT ADLT (ELECTROSURGICAL) ×2
ELECTRODE REM PT RTRN 9FT ADLT (ELECTROSURGICAL) ×1 IMPLANT
GAUZE SPONGE 4X4 16PLY XRAY LF (GAUZE/BANDAGES/DRESSINGS) IMPLANT
GLOVE BIOGEL PI IND STRL 8.5 (GLOVE) ×1 IMPLANT
GLOVE BIOGEL PI INDICATOR 8.5 (GLOVE) ×1
GLOVE ECLIPSE 8.5 STRL (GLOVE) ×2 IMPLANT
GLOVE EXAM NITRILE LRG STRL (GLOVE) IMPLANT
GLOVE EXAM NITRILE MD LF STRL (GLOVE) IMPLANT
GLOVE EXAM NITRILE XL STR (GLOVE) IMPLANT
GLOVE EXAM NITRILE XS STR PU (GLOVE) IMPLANT
GOWN STRL REUS W/ TWL LRG LVL3 (GOWN DISPOSABLE) IMPLANT
GOWN STRL REUS W/ TWL XL LVL3 (GOWN DISPOSABLE) IMPLANT
GOWN STRL REUS W/TWL 2XL LVL3 (GOWN DISPOSABLE) ×2 IMPLANT
GOWN STRL REUS W/TWL LRG LVL3 (GOWN DISPOSABLE)
GOWN STRL REUS W/TWL XL LVL3 (GOWN DISPOSABLE)
KIT BASIN OR (CUSTOM PROCEDURE TRAY) ×2 IMPLANT
KIT ROOM TURNOVER OR (KITS) ×2 IMPLANT
LIQUID BAND (GAUZE/BANDAGES/DRESSINGS) ×3 IMPLANT
NDL HYPO 18GX1.5 BLUNT FILL (NEEDLE) IMPLANT
NDL SPNL 20GX3.5 QUINCKE YW (NEEDLE) IMPLANT
NEEDLE HYPO 18GX1.5 BLUNT FILL (NEEDLE) IMPLANT
NEEDLE HYPO 22GX1.5 SAFETY (NEEDLE) ×2 IMPLANT
NEEDLE SPNL 20GX3.5 QUINCKE YW (NEEDLE) IMPLANT
NS IRRIG 1000ML POUR BTL (IV SOLUTION) ×2 IMPLANT
PACK LAMINECTOMY NEURO (CUSTOM PROCEDURE TRAY) ×2 IMPLANT
PAD ARMBOARD 7.5X6 YLW CONV (MISCELLANEOUS) ×6 IMPLANT
RUBBERBAND STERILE (MISCELLANEOUS) IMPLANT
SPONGE SURGIFOAM ABS GEL SZ50 (HEMOSTASIS) ×2 IMPLANT
SUT VIC AB 3-0 SH 8-18 (SUTURE) ×2 IMPLANT
SYR 20ML ECCENTRIC (SYRINGE) ×2 IMPLANT
SYR 5ML LL (SYRINGE) IMPLANT
TOWEL OR 17X24 6PK STRL BLUE (TOWEL DISPOSABLE) ×2 IMPLANT
TOWEL OR 17X26 10 PK STRL BLUE (TOWEL DISPOSABLE) ×2 IMPLANT
WATER STERILE IRR 1000ML POUR (IV SOLUTION) ×2 IMPLANT
WIRE TIP MIS 2.5MM NEURO (BURR) ×2 IMPLANT

## 2014-09-20 NOTE — Anesthesia Preprocedure Evaluation (Addendum)
Anesthesia Evaluation  Patient identified by MRN, date of birth, ID band Patient awake    Reviewed: Allergy & Precautions, NPO status , Patient's Chart, lab work & pertinent test results  Airway Mallampati: II  TM Distance: >3 FB Neck ROM: Full    Dental  (+) Teeth Intact, Dental Advisory Given   Pulmonary sleep apnea ,  breath sounds clear to auscultation        Cardiovascular negative cardio ROS  Rhythm:Regular Rate:Normal     Neuro/Psych    GI/Hepatic negative GI ROS, Neg liver ROS,   Endo/Other  Hypothyroidism   Renal/GU negative Renal ROS     Musculoskeletal  (+) Arthritis -,   Abdominal   Peds  Hematology   Anesthesia Other Findings   Reproductive/Obstetrics                           Anesthesia Physical Anesthesia Plan  ASA: II  Anesthesia Plan: General   Post-op Pain Management:    Induction: Intravenous  Airway Management Planned: Oral ETT  Additional Equipment: CVP  Intra-op Plan:   Post-operative Plan: Possible Post-op intubation/ventilation  Informed Consent: I have reviewed the patients History and Physical, chart, labs and discussed the procedure including the risks, benefits and alternatives for the proposed anesthesia with the patient or authorized representative who has indicated his/her understanding and acceptance.   Dental advisory given  Plan Discussed with: CRNA, Anesthesiologist and Surgeon  Anesthesia Plan Comments:       Anesthesia Quick Evaluation

## 2014-09-20 NOTE — H&P (Signed)
CHIEF COMPLAINT:                                          Bulging or herniated disc at C7-T1 on the left.  HISTORY OF PRESENT ILLNESS:                     Jeremiah Webb is a 54 year old right-handed individual whom we treated in the past.  Back in early 90s, he had a cervical fusion by Dr. Shirlean Kelly after an injury.  He did reasonably well after that time, but he notes that here on 03/31/2014, he pulled himself up to get into a truck at work and he felt a sudden pain in the region of the neck and left shoulder.  This started to radiate down the left arm and hand, he reported it at work and ultimately was sent for some treatment.  He underwent an MRI of his cervical spine, which was performed on 04/07/2014, and this study was brought in for review.  It demonstrates that the patient has a posterior arthrodesis at C4-7, with wiring that was performed in 1992.  He notes on the MRI at this time that there is presence of a herniated nucleus pulposus at C7-T1 on the left side in the foramen itself.  The vast majority of the disc appears to be quite healthy save for the narrowing in the foramen on the left side at C7-T1.  The other levels appear to be fused and stable.  Clinically, the patient notes that he has been having some numbness in the third, fourth, and fifth digits on the left hand.  He's noted no obvious strength deficit.  He finds that extending his neck particularly aggravates pain in that left shoulder and left arm and hand.  He has been reticent to do any vigorous activity for fear of aggravation of the pain.  REVIEW OF SYSTEMS:                                    Systems review is noted only for the items in the history of present illness on a 14-point review sheet.  PAST MEDICAL HISTORY:                                  . Current Medical Conditions:  His other past medical history reveals that his general health has been good.  He reports no significant medical problems.  . Prior Operations:   His only other surgeries have been the cervical fusion in 1992 and left shoulder surgery in 2003 by Dr. Eulah Pont.  . Medications and Allergies:  The only medication that he has been using currently is some hydrocodone for pain control on an occasional basis.  SOCIAL HISTORY:                                            He does not smoke.  He drinks alcohol on a social basis.  Height and weight have been stable at 6 feet 3 inches, 240 pounds.  PHYSICAL EXAMINATION:  On physical examination, I note that he has good strength in the deltoid, biceps, triceps, grips; however, the intrinsic on the left hand shows a slight degree of weakness in the extensors of the digits of being graded at 4+/5.  His grip strength appears to be slightly decreased to 4+/5 compared to the right side.  Tone and bulk in the major muscle groups is intact.  Range of motion of his neck appears to be full and without limitation, although he does note that he gets some positive Spurling maneuver when he extends his neck.  His station and gait has otherwise been normal.  IMPRESSION/PLAN:                                         Patient has evidence of a herniated nucleus pulposus at C7-T1 on the left side.  He has symptoms in the C8 distribution.  At the current time, he finds these symptoms are annoying, but he has been limiting his activity.  I note that given the minimal strength deficit that he has, we could simply observe this condition, but if the pain were to worsen or he notes worsening weakness, then I believe a microdiscectomy done in the seated position using a METRx retractor to access this region would be in his best interest.  I described the surgical procedure done in the seated position with a small tube retractor being used to access this area.  The downtime from this kind of procedure is minimal, typically about two to three weeks at best and the patient should be able to return to slow normal  activity.  At this point, the surgery is optional depending on how bad his symptoms are.  I would caution him that should there be worsening weakness of that left arm or hand, particularly in the ulnar distribution, demonstrated what muscles would specifically become weak, then the surgery should be considered more urgently, but at this point, we can see how he does with conservative treatment, that is the passage of time, to see if his symptoms ultimately resolve themselves. I will remain available to see him or schedule surgery as he feels need to be considered.  Since that first visit several months ago he has developed more atrophy and weakness of the hand in addition to the chronic pain.  I have advised that he should undergo surgical decompression. He is admitted for that today.

## 2014-09-20 NOTE — Anesthesia Procedure Notes (Addendum)
Procedure Name: Intubation Date/Time: 09/20/2014 7:45 AM Performed by: Brien Mates D Pre-anesthesia Checklist: Patient identified, Emergency Drugs available, Suction available, Patient being monitored and Timeout performed Patient Re-evaluated:Patient Re-evaluated prior to inductionOxygen Delivery Method: Circle system utilized Preoxygenation: Pre-oxygenation with 100% oxygen Intubation Type: IV induction Ventilation: Mask ventilation without difficulty Laryngoscope Size: Miller and 2 Grade View: Grade I Tube type: Oral Tube size: 8.0 mm Number of attempts: 1 Airway Equipment and Method: Stylet Placement Confirmation: ETT inserted through vocal cords under direct vision,  positive ETCO2 and breath sounds checked- equal and bilateral Secured at: 23 cm Tube secured with: Tape Dental Injury: Teeth and Oropharynx as per pre-operative assessment     RIJ CVP Dual lumen: 4656-8127: The patient was identified and consent obtained.  TO was performed, and full barrier precautions were used.  The skin was anesthetized with lidocaine.  Once the vein was located with the 22 ga. needle using ultrasound guidance , the wire was inserted into the vein.  The wire location was confirmed with ultrasound.  The tissue was dilated and the catheter was carefully inserted, then sutured in place. A dressing was applied. The patient tolerated the procedure well.  CE

## 2014-09-20 NOTE — Progress Notes (Signed)
Pt doing well. Pt given D/C instructions with Rx's, verbal understanding was provided. Pt's incision is clean and dry with no sign of infection. Pt's IV was removed prior to D/C. Pt D/C'd home via wheelchair @ 1825 per MD order. Pt is stable @ D/C and has no other needs at this time. Rema Fendt, RN

## 2014-09-20 NOTE — Discharge Instructions (Signed)

## 2014-09-20 NOTE — Op Note (Signed)
Date of operation: 09/20/2014 Preoperative diagnosis: Herniated nucleus pulposus C7-T1 on the left with left C8 radiculopathy Post operative diagnosis: Herniated nucleus pulposus C7-T1 on left with left C8 radiculopathy Procedure: Cervical discectomy C7-T1 left with Metrix tube retractor in a sitting position operating microscope Surgeon: Barnett Abu M.D. Assistant: Vernie Murders M.D. Anesthesia: Gen. endotracheal Indications: Patient is a 54 year old individual is had a herniated nucleus pulposus at C7-T1 on the left side he's had progressive weakness and atrophy in his intrinsic muscles and he's been advised regarding the need for surgical decompression.   Procedure: Patient was brought to the operating room supine on a stretcher having had central venous monitoring catheter in appropriate other lines placed in the preoperative holding area. After the smooth induction of general endotracheal anesthesia the patient was placed in a 3 pin headrest and then carefully placed into the seated position the back of the neck exposed. Fluoroscopy imaging was brought into the cross table lateral position to view the vertebrae of the cervical spine. Back of the neck was cleansed with alcohol and DuraPrep and draped in a sterile fashion. Localizing radiographs with a small 22-gauge needle identifying the surface anatomy was used to localize the appropriate level. C7-T1 Was identified positively on the radiograph and correlated with fluoroscopy. Skin in this area was infiltrated with 10 cc of 1% lidocaine mixed 50-50 with half percent Marcaine and 1-100,000 epinephrine. A small vertical incision was created over this area and a K wire was then passed to the interlaminar space of C7-T1. A wanting technique a series of dilators was used to create an opening down to the interlaminar space. Ultimately a 5 centimeter by 18 mm diameter cannula was placed in the wound and fixed to the operating table with a clamp. The  operating microscope was brought into the field and through the aperture soft tissues above the interlaminar space of C7-T1 were clean. The inferior margin of the lamina of C7 and the superior margin of the lamina and facet complex of C7-T1 were then removed with 2.2 mm high-speed bur. Soft tissues in this area were then cauterized and divided and this identified the path of the C8 nerve root. This was carefully dissected and by working from underneath the nerve root a significant mass of tissue was identified. When this was incised with a #11 blade under moderate pressure some fragments of this extruded themselves. These were removed with a micropituitary rongeur. Further dissection yielded other fragments of disc which were similarly removed. Dissection over the shoulder of the nerve was then performed and this yielded further fragments of disc. This procedure continued from above and below the exiting nerve root until all fragments were removed. The stasis was then carefully obtained with some small pledgets of Gelfoam soaked in thrombin which were then tear gated away. When adequate decompression was completed the area was inspected for hemostasis yet again and then the endoscopic cannula was removed and the fascia and the lung was closed with 3-0 Vicryl in interrupted fashion and the skin was closed with 3-0 Vicryl in an inverted interrupted fashion. Dermabond was placed on the skin blood loss was 20 mL.

## 2014-09-20 NOTE — Transfer of Care (Signed)
Immediate Anesthesia Transfer of Care Note  Patient: Jeremiah Webb  Procedure(s) Performed: Procedure(s) with comments: Left C7-T1 "Sitting" Microdiskectomy (Left) - Left C7-T1 "Sitting" Microdiskectomy  Patient Location: PACU  Anesthesia Type:General  Level of Consciousness: sedated  Airway & Oxygen Therapy: Patient Spontanous Breathing and Patient connected to face mask oxygen  Post-op Assessment: Report given to RN and Post -op Vital signs reviewed and stable  Post vital signs: Reviewed and stable  Last Vitals:  Filed Vitals:   09/20/14 0639  BP: 139/71  Pulse: 76  Temp: 36.7 C  Resp: 18    Complications: No apparent anesthesia complications

## 2014-09-20 NOTE — Anesthesia Postprocedure Evaluation (Signed)
  Anesthesia Post-op Note  Patient: Jeremiah Webb  Procedure(s) Performed: Procedure(s) with comments: Left C7-T1 "Sitting" Microdiskectomy (Left) - Left C7-T1 "Sitting" Microdiskectomy  Patient Location: PACU  Anesthesia Type:General  Level of Consciousness: awake  Airway and Oxygen Therapy: Patient Spontanous Breathing  Post-op Pain: mild  Post-op Assessment: Post-op Vital signs reviewed  Post-op Vital Signs: Reviewed  Last Vitals:  Filed Vitals:   09/20/14 1130  BP: 126/86  Pulse: 93  Temp:   Resp: 14    Complications: No apparent anesthesia complications

## 2014-09-20 NOTE — Discharge Summary (Signed)
Physician Discharge Summary  Patient ID: Jeremiah Webb MRN: [id number] DOB/AGE: 08-29-1960 54 y.o.  Admit date: 09/20/2014 Discharge date: 09/20/2014  Admission Diagnoses: Herniated nucleus pulposus C7-T1 left with left cervical radiculopathy  Discharge Diagnoses: Herniated nucleus pulposus C7-T1 left with left cervical radiculopathy Active Problems:   Herniated nucleus pulposus   Discharged Condition: good  Hospital Course: Patient was admitted to undergo surgery and he tolerated this well.  Consults: None  Significant Diagnostic Studies: None  Treatments: surgery: Microdiscectomy C7-T1 left with Metrix dissector operating microscope  Discharge Exam: Blood pressure 146/82, pulse 107, temperature 98.2 F (36.8 C), temperature source Oral, resp. rate 16, height 6\' 3"  (1.905 m), weight 108.551 kg (239 lb 5 oz), SpO2 98 %. Incision is clean and dry motor function is intact in the upper extremities.  Disposition: 01-Home or Self Care  Discharge Instructions    Call MD for:  redness, tenderness, or signs of infection (pain, swelling, redness, odor or green/yellow discharge around incision site)    Complete by:  As directed      Call MD for:  severe uncontrolled pain    Complete by:  As directed      Call MD for:  temperature >100.4    Complete by:  As directed      Diet - low sodium heart healthy    Complete by:  As directed      Discharge instructions    Complete by:  As directed   Okay to shower. Do not apply salves or appointments to incision. No heavy lifting with the upper extremities greater than 15 pounds. May resume driving when not requiring pain medication and patient feels comfortable with doing so.     Increase activity slowly    Complete by:  As directed             Medication List    TAKE these medications        diazepam 5 MG tablet  Commonly known as:  VALIUM  Take 1 tablet (5 mg total) by mouth every 6 (six) hours as needed for muscle spasms.     gabapentin 300 MG capsule  Commonly known as:  NEURONTIN  One tab PO qHS for a week, then BID for a week, then TID. May double weekly to a max of 3,600mg /day     levothyroxine 100 MCG tablet  Commonly known as:  SYNTHROID, LEVOTHROID  Take 1 tablet (100 mcg total) by mouth daily.     oxyCODONE-acetaminophen 5-325 MG per tablet  Commonly known as:  PERCOCET/ROXICET  Take 1-2 tablets by mouth every 4 (four) hours as needed for moderate pain.         SignedStefani Dama 09/20/2014, 5:02 PM

## 2014-09-21 ENCOUNTER — Encounter (HOSPITAL_COMMUNITY): Payer: Self-pay | Admitting: Neurological Surgery

## 2014-09-25 ENCOUNTER — Encounter (HOSPITAL_COMMUNITY): Payer: Self-pay | Admitting: *Deleted

## 2014-09-25 ENCOUNTER — Emergency Department (INDEPENDENT_AMBULATORY_CARE_PROVIDER_SITE_OTHER): Payer: Commercial Managed Care - PPO

## 2014-09-25 ENCOUNTER — Emergency Department (INDEPENDENT_AMBULATORY_CARE_PROVIDER_SITE_OTHER)
Admission: EM | Admit: 2014-09-25 | Discharge: 2014-09-25 | Disposition: A | Discharge status: Home / Self Care | Payer: Commercial Managed Care - PPO | Source: Home / Self Care | Attending: Emergency Medicine | Admitting: Emergency Medicine

## 2014-09-25 DIAGNOSIS — M10072 Idiopathic gout, left ankle and foot: Secondary | ICD-10-CM

## 2014-09-25 DIAGNOSIS — M109 Gout, unspecified: Secondary | ICD-10-CM

## 2014-09-25 DIAGNOSIS — M25579 Pain in unspecified ankle and joints of unspecified foot: Secondary | ICD-10-CM

## 2014-09-25 LAB — URIC ACID: URIC ACID, SERUM: 7.3 mg/dL (ref 4.0–7.8)

## 2014-09-25 MED ORDER — KETOROLAC TROMETHAMINE 60 MG/2ML IM SOLN
60.0000 mg | Freq: Once | INTRAMUSCULAR | Status: AC
  Administered 2014-09-25: 60 mg via INTRAMUSCULAR

## 2014-09-25 MED ORDER — KETOROLAC TROMETHAMINE 60 MG/2ML IM SOLN
INTRAMUSCULAR | Status: AC
  Filled 2014-09-25: qty 2

## 2014-09-25 MED ORDER — PREDNISONE 20 MG PO TABS: 20.0000 mg | ORAL_TABLET | Freq: Two times a day (BID) | ORAL | Status: DC

## 2014-09-25 MED ORDER — COLCHICINE 0.6 MG PO TABS: ORAL_TABLET | ORAL | Status: DC

## 2014-09-25 NOTE — Discharge Instructions (Signed)
Gout is caused by excessive buildup of uric acid in the blood stream which then can settle out in the joints causing a very painful arthritis.  Dietary factors can increase or decrease your risk of gout. ° °I recommend that you decrease your intake the of the following: ° °· Organ meats such as liver and kidneys. °· Red meats like beef, pork, lamb, bacon, venison, and turkey. °· Certain fish such as anchovies, sardines, herring, mussels, cod, scallops, trout, and haddock.  °· Beer and liquor. °· Any foods containing high fructose corn syrup.  ° °The following may be protective against gout: ° °· Dairy products. °· Small amounts of wine. °· Coffee. °· Vitamin C. °· Cherries °·  °

## 2014-09-25 NOTE — Progress Notes (Signed)
Quick Note:  Test result was normal. No further action is needed at this time. The patient was informed of this normal result. I still think he has gout, and have recommended that he continue on with treatment as outlined in his note. ______

## 2014-09-25 NOTE — ED Provider Notes (Signed)
Chief Complaint    Foot Pain   History of Present Illness     Jeremiah Webb is a 54 year old male who has had a previous history of gout and was recently hospitalized for neck surgery. He has had a four-day history of pain just anterior to his left lateral malleolus. The pain began spontaneously and denies any injury or trauma. It's swollen, slightly erythematous, and feels warm. He denies any fever or chills. No other joint pain.  Review of Systems     Other than as noted above, the patient denies any of the following symptoms: Systemic:  No fevers or chills.   Musculoskeletal:  No arthritis, back pain, or neck pain. Neurological:  No muscular weakness or paresthesias.  PMFSH    Past medical history, family history, social history, meds, and allergies were reviewed.    Physical Exam    Vital signs:  BP 134/90 mmHg  Pulse 81  Temp(Src) 98.6 F (37 C) (Oral)  Resp 16  SpO2 96% Gen:  Alert and oriented times 3.  In no distress. Musculoskeletal: there is swelling, erythema, warmth, and tenderness to palpation just anterior to the lateral malleolus of the left ankle. There is no pain over the malleoli themselves, no pain medially, no pain over the dorsum of the foot or the MTP of the great toe.  Otherwise, all joints had a full a ROM with no swelling, bruising or deformity.  No edema, pulses full. Extremities were warm and pink.  Capillary refill was brisk.  Skin:  Clear, warm and dry.  No rash. Neuro:  Alert and oriented times 3.  Muscle strength was normal.  Sensation was intact to light touch.   Radiology     Dg Ankle Complete Left  09/25/2014   CLINICAL DATA:  LEFT ankle pain.  No injury.  Initial encounter.  EXAM: LEFT ANKLE COMPLETE - 3+ VIEW  COMPARISON:  None.  FINDINGS: The ankle mortise is congruent. The talar dome appears intact. There is no fracture or or acute osseous abnormality. 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 There is no periosteal reaction to suggest an aggressive lesion. Bone island is in the differential considerations.  IMPRESSION: No acute abnormality.   Electronically Signed   By: Andreas Newport M.D.   On: 09/25/2014 16:41   I reviewed the images independently and personally and concur with the radiologist's findings.  [facility]   The following medications were given:  Medications  ketorolac (TORADOL) injection 60 mg (60 mg Intramuscular Given 09/25/14 1620)   Assessment    The primary encounter diagnosis was Acute gout of left ankle, unspecified cause. A diagnosis of Ankle pain was also pertinent to this visit.  Risk factors for gout include prior history of gout, lack of fever or trauma, and recent surgery. A uric acid level has been drawn, I will call him back about results.  Plan   1.  Meds:  The following meds were prescribed:   Discharge Medication List as of 09/25/2014  4:59 PM    START taking these medications   Details  colchicine 0.6 MG tablet Take 2 now and 1 in 1 hour.  May repeat dose once daily.  For gout attack., Normal    predniSONE (DELTASONE) 20 MG tablet Take 1 tablet (20 mg total) by mouth 2 (two) times daily., Starting 09/25/2014, Until Discontinued, Normal        2.  Patient  Education/Counseling:  The patient was given appropriate handouts, self care instructions, and instructed in pain control, rest and activity, elevation, application of ice and compression.  Given dietary information for prevention of gout.  3.  Follow up:  The patient was told to follow up here if no better in 3 to 4 days, or sooner if becoming worse in any way, and given some red flag symptoms such as worsening pain or new neurological symptoms which would prompt immediate return. Follow-up with his primary care physician, Dr. Earl Lites, for discussion about urate lowering therapy.    Reuben Likes, MD 09/25/14 [phone]

## 2014-09-25 NOTE — ED Notes (Signed)
Assessment per Dr. Keller. 

## 2014-10-07 ENCOUNTER — Ambulatory Visit: Payer: Self-pay | Admitting: Sports Medicine

## 2014-10-27 ENCOUNTER — Other Ambulatory Visit: Payer: Self-pay | Admitting: Family Medicine

## 2014-11-23 ENCOUNTER — Other Ambulatory Visit (HOSPITAL_COMMUNITY): Payer: Self-pay | Admitting: Neurological Surgery

## 2014-11-23 DIAGNOSIS — M5412 Radiculopathy, cervical region: Secondary | ICD-10-CM

## 2014-12-02 ENCOUNTER — Ambulatory Visit (HOSPITAL_COMMUNITY)
Admission: RE | Admit: 2014-12-02 | Discharge: 2014-12-02 | Disposition: A | Discharge status: Home / Self Care | Payer: Commercial Managed Care - PPO | Source: Ambulatory Visit | Attending: Neurological Surgery | Admitting: Neurological Surgery

## 2014-12-02 DIAGNOSIS — M5021 Other cervical disc displacement,  high cervical region: Secondary | ICD-10-CM | POA: Insufficient documentation

## 2014-12-02 DIAGNOSIS — R200 Anesthesia of skin: Secondary | ICD-10-CM | POA: Diagnosis not present

## 2014-12-02 DIAGNOSIS — M5412 Radiculopathy, cervical region: Secondary | ICD-10-CM | POA: Diagnosis present

## 2014-12-02 DIAGNOSIS — Z981 Arthrodesis status: Secondary | ICD-10-CM | POA: Insufficient documentation

## 2014-12-02 DIAGNOSIS — M5022 Other cervical disc displacement, mid-cervical region: Secondary | ICD-10-CM | POA: Insufficient documentation

## 2014-12-14 ENCOUNTER — Other Ambulatory Visit: Payer: Self-pay | Admitting: Family Medicine

## 2015-01-11 ENCOUNTER — Telehealth (HOSPITAL_COMMUNITY): Payer: Self-pay | Admitting: *Deleted

## 2015-01-11 DIAGNOSIS — R002 Palpitations: Secondary | ICD-10-CM

## 2015-01-11 NOTE — Telephone Encounter (Signed)
Pt's mother was seen today by Dr Shirlee Latch and Mr. Lockett discussed w/him that he has been having "PVCs" per Dr Shirlee Latch pt needs 48 hour holter, echo and f/u with him as a new pt, orders placed, will schedule

## 2015-01-26 ENCOUNTER — Ambulatory Visit (INDEPENDENT_AMBULATORY_CARE_PROVIDER_SITE_OTHER): Payer: Commercial Managed Care - PPO

## 2015-01-26 ENCOUNTER — Other Ambulatory Visit (HOSPITAL_COMMUNITY): Payer: Self-pay | Admitting: Cardiology

## 2015-01-26 DIAGNOSIS — I493 Ventricular premature depolarization: Secondary | ICD-10-CM

## 2015-01-26 DIAGNOSIS — R002 Palpitations: Secondary | ICD-10-CM

## 2015-01-31 ENCOUNTER — Ambulatory Visit (HOSPITAL_COMMUNITY): Admission: RE | Admit: 2015-01-31 | Payer: Commercial Managed Care - PPO | Source: Ambulatory Visit

## 2015-02-12 ENCOUNTER — Ambulatory Visit (INDEPENDENT_AMBULATORY_CARE_PROVIDER_SITE_OTHER): Payer: 59 | Admitting: Internal Medicine

## 2015-02-12 VITALS — BP 120/72 | HR 67 | Temp 98.3°F | Resp 16 | Ht 75.0 in | Wt 236.1 lb

## 2015-02-12 DIAGNOSIS — R05 Cough: Secondary | ICD-10-CM | POA: Diagnosis not present

## 2015-02-12 DIAGNOSIS — R059 Cough, unspecified: Secondary | ICD-10-CM

## 2015-02-12 DIAGNOSIS — E039 Hypothyroidism, unspecified: Secondary | ICD-10-CM

## 2015-02-12 DIAGNOSIS — Z125 Encounter for screening for malignant neoplasm of prostate: Secondary | ICD-10-CM | POA: Diagnosis not present

## 2015-02-12 DIAGNOSIS — B351 Tinea unguium: Secondary | ICD-10-CM | POA: Diagnosis not present

## 2015-02-12 LAB — POCT CBC
Granulocyte percent: 61.8 %G (ref 37–80)
HCT, POC: 42.3 % — AB (ref 43.5–53.7)
Hemoglobin: 13.5 g/dL — AB (ref 14.1–18.1)
Lymph, poc: 1.7 (ref 0.6–3.4)
MCH, POC: 28.8 pg (ref 27–31.2)
MCHC: 31.8 g/dL (ref 31.8–35.4)
MCV: 90.4 fL (ref 80–97)
MID (cbc): 0.6 (ref 0–0.9)
MPV: 6.4 fL (ref 0–99.8)
POC Granulocyte: 3.6 (ref 2–6.9)
POC LYMPH %: 28.7 % (ref 10–50)
POC MID %: 9.5 %M (ref 0–12)
Platelet Count, POC: 338 10*3/uL (ref 142–424)
RBC: 4.68 M/uL — AB (ref 4.69–6.13)
RDW, POC: 12.6 %
WBC: 5.9 10*3/uL (ref 4.6–10.2)

## 2015-02-12 MED ORDER — LEVOTHYROXINE SODIUM 100 MCG PO TABS: 100.0000 ug | ORAL_TABLET | Freq: Every day | ORAL | Status: DC

## 2015-02-12 MED ORDER — HYDROCODONE-HOMATROPINE 5-1.5 MG/5ML PO SYRP: 5.0000 mL | ORAL_SOLUTION | Freq: Four times a day (QID) | ORAL | Status: DC | PRN

## 2015-02-12 MED ORDER — TERBINAFINE HCL 250 MG PO TABS: 250.0000 mg | ORAL_TABLET | Freq: Every day | ORAL | Status: DC

## 2015-02-12 NOTE — Progress Notes (Addendum)
Subjective:  This chart was scribed for Ellamae Sia, MD by [facility], medical scribe at [facility].The patient was seen in exam room 11 and the patient's care was started at 3:51 PM.   Patient ID: Jeremiah Webb, male    DOB: [birth year], 54 y.o.   MRN: [id number] Chief Complaint  Patient presents with  . Nail Problem    left great toe the nail is discolored  . Cough    green sputum, 3 days, denies any body aches, fever, chills.  tickle that starts and makes him cough  . Medication Refill    refill of synthroid   HPI HPI Comments: Jeremiah Webb is a 54 y.o. male who presents to [facility] complaining of chest congestion, cough, itch scratchy throat for three days. His cough occasionally develops a green phlegm. The cough has kept him up at night. He denies fever, chills, shortness of breath, fatigue, post nasal drip, acid reflux  He also complains of a possible fungal infection on his left great toe, he has begun to notice a similar discoloration on other toes on both feet. The nail is discolored.   Pt would also like a refill for synthroid. He has not taken his medication for four days now. Review of the chart indicates frequent noncompliance in the past and lack of follow-up for any health maintenance issues. Synthroid was increased from 50 to 100 g about a year ago and he has not been retested.  Past Medical History  Diagnosis Date  . Thyroid disease     Hypothyroid  . Hypothyroidism   . Sleep apnea     no cpap   Prior to Admission medications   Medication Sig Start Date End Date Taking? Authorizing Provider  levothyroxine (SYNTHROID, LEVOTHROID) 100 MCG tablet Take 1 tablet (100 mcg total) by mouth daily. PATIENT NEEDS OFFICE VISIT/LAB FOR ADDITIONAL REFILLS 10/28/14  Yes Elvina Sidle, MD  levothyroxine (SYNTHROID, LEVOTHROID) 100 MCG tablet Take 1 tablet (100 mcg total) by mouth daily. PATIENT NEEDS OFFICE VISIT/LABS FOR  ADDITIONAL REFILLS 12/16/14  Yes Collene Gobble, MD  colchicine 0.6 MG tablet Take 2 now and 1 in 1 hour.  May repeat dose once daily.  For gout attack. Patient not taking: Reported on 02/12/2015 09/25/14   Reuben Likes, MD  diazepam (VALIUM) 5 MG tablet Take 1 tablet (5 mg total) by mouth every 6 (six) hours as needed for muscle spasms. Patient not taking: Reported on 02/12/2015 09/20/14   Barnett Abu, MD  gabapentin (NEURONTIN) 300 MG capsule One tab PO qHS for a week, then BID for a week, then TID. May double weekly to a max of 3,600mg /day Patient not taking: Reported on 09/13/2014 09/09/14   Monica Becton, MD  oxyCODONE-acetaminophen (PERCOCET/ROXICET) 5-325 MG per tablet Take 1-2 tablets by mouth every 4 (four) hours as needed for moderate pain. Patient not taking: Reported on 02/12/2015 09/20/14   Barnett Abu, MD  predniSONE (DELTASONE) 20 MG tablet Take 1 tablet (20 mg total) by mouth 2 (two) times daily. Patient not taking: Reported on 02/12/2015 09/25/14   Reuben Likes, MD   Allergies  Allergen Reactions  . Shrimp [Shellfish Allergy]     Only shrimp causes indigestion. No epi-pen necessary.   Review of Systems  Constitutional: Negative for fever, chills and fatigue.  HENT: Positive for congestion. Negative for postnasal drip.   Respiratory: Positive for cough. Negative for shortness of breath.  Skin: Positive for color change.      Objective:  BP 120/72 mmHg  Pulse 67  Temp(Src) 98.3 F (36.8 C) (Oral)  Resp 16  Ht  (1.905 m)  Wt 236 lb 2 oz (107.106 kg)  BMI 29.51 kg/m2  SpO2 97% Physical Exam  Constitutional: He is oriented to person, place, and time. He appears well-developed and well-nourished. No distress.  HENT:  Head: Normocephalic and atraumatic.  Eyes: Pupils are equal, round, and reactive to light.  Neck: Normal range of motion.  Cardiovascular: Normal rate and regular rhythm.   Pulmonary/Chest: Effort normal. No respiratory distress.  Musculoskeletal:  Normal range of motion.  Neurological: He is alert and oriented to person, place, and time.  Skin: Skin is warm and dry.  Psychiatric: He has a normal mood and affect. His behavior is normal.  Nursing note and vitals reviewed.  Results for orders placed or performed in visit on 02/12/15  POCT CBC  Result Value Ref Range   WBC 5.9 4.6 - 10.2 K/uL   Lymph, poc 1.7 0.6 - 3.4   POC LYMPH PERCENT 28.7 10 - 50 %L   MID (cbc) 0.6 0 - 0.9   POC MID % 9.5 0 - 12 %M   POC Granulocyte 3.6 2 - 6.9   Granulocyte percent 61.8 37 - 80 %G   RBC 4.68 (A) 4.69 - 6.13 M/uL   Hemoglobin 13.5 (A) 14.1 - 18.1 g/dL   HCT, POC 69.6 (A) 29.5 - 53.7 %   MCV 90.4 80 - 97 fL   MCH, POC 28.8 27 - 31.2 pg   MCHC 31.8 31.8 - 35.4 g/dL   RDW, POC 28.4 %   Platelet Count, POC 338 142 - 424 K/uL   MPV 6.4 0 - 99.8 fL       Assessment & Plan:  Hypothyroidism, unspecified hypothyroidism type - Plan: Comprehensive metabolic panel, TSH, T4, free  Onychomycosis - Plan: Comprehensive metabolic panel  Screening for prostate cancer - Plan: PSA  Cough - Plan: POCT CBC  Meds ordered this encounter  Medications  . levothyroxine (SYNTHROID, LEVOTHROID) 100 MCG tablet    Sig: Take 1 tablet (100 mcg total) by mouth daily.    Dispense:  90 tablet    Refill:  3  . terbinafine (LAMISIL) 250 MG tablet    Sig: Take 1 tablet (250 mg total) by mouth daily. You will need 12 weeks of consecutive treatment    Dispense:  30 tablet    Refill:  2  . HYDROcodone-homatropine (HYCODAN) 5-1.5 MG/5ML syrup    Sig: Take 5 mLs by mouth every 6 (six) hours as needed.    Dispense:  120 mL    Refill:  0   Notify after labs He needs an office visit for health maintenance issues including immunizations-chart review reveals colonoscopy within normal limits by Dr. Gerilyn Pilgrim in 2015. Overdue for tetanus.   I have completed the patient encounter in its entirety as documented by the scribe, with editing by me where necessary. Deo Mehringer P.  Merla Riches, M.D.

## 2015-02-13 LAB — COMPREHENSIVE METABOLIC PANEL
ALK PHOS: 51 U/L (ref 39–117)
ALT: 51 U/L (ref 0–53)
AST: 36 U/L (ref 0–37)
Albumin: 4 g/dL (ref 3.5–5.2)
BUN: 16 mg/dL (ref 6–23)
CHLORIDE: 109 meq/L (ref 96–112)
CO2: 22 mEq/L (ref 19–32)
Calcium: 9.4 mg/dL (ref 8.4–10.5)
Creat: 1.42 mg/dL — ABNORMAL HIGH (ref 0.50–1.35)
Glucose, Bld: 83 mg/dL (ref 70–99)
Potassium: 4 mEq/L (ref 3.5–5.3)
Sodium: 142 mEq/L (ref 135–145)
Total Bilirubin: 0.5 mg/dL (ref 0.2–1.2)
Total Protein: 6.8 g/dL (ref 6.0–8.3)

## 2015-02-13 LAB — TSH: TSH: 31.98 u[IU]/mL — ABNORMAL HIGH (ref 0.350–4.500)

## 2015-02-13 LAB — T4, FREE: Free T4: 0.54 ng/dL — ABNORMAL LOW (ref 0.80–1.80)

## 2015-02-13 LAB — PSA: PSA: 1.77 ng/mL (ref <=4.00)

## 2015-02-14 ENCOUNTER — Ambulatory Visit (HOSPITAL_COMMUNITY)
Admission: RE | Admit: 2015-02-14 | Discharge: 2015-02-14 | Disposition: A | Discharge status: Home / Self Care | Payer: 59 | Source: Ambulatory Visit | Attending: Cardiology | Admitting: Cardiology

## 2015-02-14 ENCOUNTER — Encounter (HOSPITAL_COMMUNITY): Payer: Self-pay

## 2015-02-14 VITALS — BP 122/70 | HR 61 | Wt 235.8 lb

## 2015-02-14 DIAGNOSIS — G4733 Obstructive sleep apnea (adult) (pediatric): Secondary | ICD-10-CM | POA: Insufficient documentation

## 2015-02-14 DIAGNOSIS — R002 Palpitations: Secondary | ICD-10-CM | POA: Diagnosis not present

## 2015-02-14 DIAGNOSIS — Z8249 Family history of ischemic heart disease and other diseases of the circulatory system: Secondary | ICD-10-CM | POA: Diagnosis not present

## 2015-02-14 DIAGNOSIS — Z79899 Other long term (current) drug therapy: Secondary | ICD-10-CM | POA: Diagnosis not present

## 2015-02-14 DIAGNOSIS — I493 Ventricular premature depolarization: Secondary | ICD-10-CM

## 2015-02-14 DIAGNOSIS — Z9189 Other specified personal risk factors, not elsewhere classified: Secondary | ICD-10-CM | POA: Insufficient documentation

## 2015-02-14 DIAGNOSIS — E039 Hypothyroidism, unspecified: Secondary | ICD-10-CM | POA: Insufficient documentation

## 2015-02-14 LAB — LIPID PANEL
Cholesterol: 146 mg/dL (ref 0–200)
HDL: 29 mg/dL — ABNORMAL LOW (ref >40)
LDL CALC: 96 mg/dL (ref 0–99)
TRIGLYCERIDES: 104 mg/dL (ref <150)
Total CHOL/HDL Ratio: 5 RATIO
VLDL: 21 mg/dL (ref 0–40)

## 2015-02-14 NOTE — Patient Instructions (Signed)
Your Provider request you have an Echocardiogram (ultrasound of the heart)  FOLLOW UP in 12 months.

## 2015-02-14 NOTE — Progress Notes (Signed)
Patient ID: Jeremiah Webb, male   DOB: [birth year], 54 y.o.   MRN: [id number] PCP: Dr. Merla Riches  54 yo with history of PVCs presents for cardiology evaluation.  Patient has a long history of PVCs.  He had a workup back in 2008 for frequent PVCs.  At that time, he had a stress echo which was normal.  He has symptoms on and off.  When he is symptomatic, he feels skipped beats and palpitations.  About 2 months ago, he had a couple of weeks of severe palpitations.  There was no particular trigger.  No particular stressors. Symptoms have since resolved.  Now, he is having rare palpitations.  He has no exertional dyspnea or chest pain.  He is active and lifts weights, walks, and jogs.  Good exercise tolerance. No lightheadedness or syncope.  Holter monitor was done in 6/16, showing very rare PVCs.  He was not having significant symptoms when he wore the holter.    He does have a history of hypothyroidism and recent TSH was high with low free T4.   ECG: NSR, normal  Labs (7/16): K 4, creatinine 1.42, TSH 31, free T4 low  PMH: 1. Hypothyroidism 2. OSA: Not using CPAP 3. C-spine arthritis 4. H/o PVCs: Stress echo in 7/08 was normal.  Holter (6/30) with very rare PVCs.   SH: Airline pilot, nonsmoker, 2 kids, lives in [place].  FH: Mother with CAD, MI around 88.   ROS: All systems reviewed and negative except as per HPI.   Current Outpatient Prescriptions  Medication Sig Dispense Refill  . levothyroxine (SYNTHROID, LEVOTHROID) 100 MCG tablet Take 1 tablet (100 mcg total) by mouth daily. 90 tablet 3  . gabapentin (NEURONTIN) 300 MG capsule One tab PO qHS for a week, then BID for a week, then TID. May double weekly to a max of 3,600mg /day (Patient not taking: Reported on 09/13/2014) 180 capsule 3   No current facility-administered medications for this encounter.   BP 122/70 mmHg  Pulse 61  Wt 235 lb 12 oz (106.935 kg)  SpO2 98% General: NAD Neck: No JVD, no thyromegaly or thyroid nodule.   Lungs: Clear to auscultation bilaterally with normal respiratory effort. CV: Nondisplaced PMI.  Heart regular S1/S2, no S3/S4, no murmur.  No peripheral edema.  No carotid bruit.  Normal pedal pulses.  Abdomen: Soft, nontender, no hepatosplenomegaly, no distention.  Skin: Intact without lesions or rashes.  Neurologic: Alert and oriented x 3.  Psych: Normal affect. Extremities: No clubbing or cyanosis.  HEENT: Normal.   Assessment/Plan: 1. Palpitations: This symptom is episodic.  Currently rare.  He has a history of symptomatic PVCs from as far back as 2008 when he had a negative stress echo.  ECG was normal today and recent holter showed rare PVCs.  For now, no indication to add beta blocker given current lack of symptoms.  He could use prn metoprolol if he becomes symptomatic again. I think it would be reasonable to get an echo to make sure that his heart is structurally normal.  No indication for repeat stress testing.  2. Cardiovascular risk assessment: Patient has family history of premature CAD (mother with MI around 23).  He does not have exertional symptoms.  No diabetes, HTN, or known hyperlipidemia.  He never smoked.   - Reasonable to take ASA 81 daily.  - Echo to workup PVCs as above.  - check lipids 3. Hypothyroidism: Suspect under-replaced thyroid.  Per PCP.   Marca Ancona 02/14/2015

## 2015-02-15 ENCOUNTER — Ambulatory Visit (HOSPITAL_COMMUNITY): Payer: 59

## 2015-03-03 NOTE — ED Notes (Signed)
Patient called, asked for refill of prednisone and allopurinol, left message on answering machine. Called patient to advise we cannot provide refill medications

## 2015-03-16 ENCOUNTER — Ambulatory Visit (HOSPITAL_COMMUNITY)
Admission: RE | Admit: 2015-03-16 | Discharge: 2015-03-16 | Disposition: A | Discharge status: Home / Self Care | Payer: 59 | Source: Ambulatory Visit | Attending: Cardiology | Admitting: Cardiology

## 2015-03-16 DIAGNOSIS — I071 Rheumatic tricuspid insufficiency: Secondary | ICD-10-CM | POA: Diagnosis not present

## 2015-03-16 DIAGNOSIS — I493 Ventricular premature depolarization: Secondary | ICD-10-CM | POA: Diagnosis present

## 2015-03-30 ENCOUNTER — Other Ambulatory Visit (HOSPITAL_COMMUNITY): Payer: Self-pay | Admitting: Cardiology

## 2015-03-30 DIAGNOSIS — I5022 Chronic systolic (congestive) heart failure: Secondary | ICD-10-CM

## 2015-04-05 ENCOUNTER — Encounter: Payer: Self-pay | Admitting: Internal Medicine

## 2015-04-11 ENCOUNTER — Ambulatory Visit (HOSPITAL_COMMUNITY): Admission: RE | Admit: 2015-04-11 | Payer: 59 | Source: Ambulatory Visit

## 2015-04-13 ENCOUNTER — Encounter (HOSPITAL_COMMUNITY): Payer: Self-pay | Admitting: Emergency Medicine

## 2015-04-13 ENCOUNTER — Emergency Department (HOSPITAL_COMMUNITY)
Admission: EM | Admit: 2015-04-13 | Discharge: 2015-04-13 | Disposition: A | Discharge status: Home / Self Care | Payer: 59 | Source: Home / Self Care | Attending: Family Medicine | Admitting: Family Medicine

## 2015-04-13 DIAGNOSIS — M10062 Idiopathic gout, left knee: Secondary | ICD-10-CM | POA: Diagnosis not present

## 2015-04-13 MED ORDER — INDOMETHACIN 25 MG PO CAPS: 25.0000 mg | ORAL_CAPSULE | Freq: Three times a day (TID) | ORAL | Status: DC

## 2015-04-13 MED ORDER — COLCHICINE 0.6 MG PO TABS: 0.6000 mg | ORAL_TABLET | Freq: Two times a day (BID) | ORAL | Status: DC

## 2015-04-13 NOTE — ED Provider Notes (Signed)
CSN: [id number]     Arrival date & time 04/13/15  1304 History   None    Chief Complaint  Patient presents with  . Medication Refill   (Consider location/radiation/quality/duration/timing/severity/associated sxs/prior Treatment) Patient is a 54 y.o. male presenting with knee pain. The history is provided by the patient.  Knee Pain Location:  Knee Time since incident:  1 week Injury: no   Knee location:  L knee Pain details:    Quality:  Sharp   Severity:  Moderate   Onset quality:  Gradual   Progression:  Unchanged Chronicity:  Recurrent Dislocation: no   Prior injury to area:  No Associated symptoms: swelling   Risk factors comment:  S/p knee surg, h/o gout   Past Medical History  Diagnosis Date  . Thyroid disease     Hypothyroid  . Hypothyroidism   . Sleep apnea     no cpap   Past Surgical History  Procedure Laterality Date  . Quadriceps tendon repair    . Cervical disc surgery      #5  . Posterior cervical laminectomy with met- rx Left 09/20/2014    Procedure: Left C7-T1 "Sitting" Microdiskectomy;  Surgeon: Kristeen Miss, MD;  Location: [facility];  Service: Neurosurgery;  Laterality: Left;  Left C7-T1 "Sitting" Microdiskectomy  . Shoulder surgery     Family History  Problem Relation Age of Onset  . Heart disease Mother   . Stroke Mother   . Diabetes Father   . Colon cancer Maternal Grandfather   . Esophageal cancer Neg Hx   . Rectal cancer Neg Hx   . Stomach cancer Neg Hx    Social History  Substance Use Topics  . Smoking status: Never Smoker   . Smokeless tobacco: Never Used  . Alcohol Use: 0.0 oz/week    0 Standard drinks or equivalent per week     Comment: occasional    Review of Systems  Musculoskeletal: Positive for joint swelling and gait problem.  Skin: Negative.     Allergies  Shrimp  Home Medications   Prior to Admission medications   Medication Sig Start Date End Date Taking? Authorizing Provider  colchicine 0.6 MG tablet Take 1  tablet (0.6 mg total) by mouth 2 (two) times daily. 04/13/15   Billy Fischer, MD  gabapentin (NEURONTIN) 300 MG capsule One tab PO qHS for a week, then BID for a week, then TID. May double weekly to a max of 3,$Remove'600mg'SOBavSX$ /day Patient not taking: Reported on 09/13/2014 09/09/14   Silverio Decamp, MD  indomethacin (INDOCIN) 25 MG capsule Take 1 capsule (25 mg total) by mouth 3 (three) times daily with meals. 04/13/15   Billy Fischer, MD  levothyroxine (SYNTHROID, LEVOTHROID) 100 MCG tablet Take 1 tablet (100 mcg total) by mouth daily. 02/12/15   Leandrew Koyanagi, MD   Meds Ordered and Administered this Visit  Medications - No data to display  BP 120/79 mmHg  Pulse 103  Temp(Src) 99 F (37.2 C) (Oral)  Resp 16  SpO2 95% No data found.   Physical Exam  Constitutional: He is oriented to person, place, and time. He appears well-developed and well-nourished. No distress.  Musculoskeletal: He exhibits tenderness.       Left knee: He exhibits swelling and effusion. He exhibits normal alignment. Tenderness found. Patellar tendon tenderness noted.       Legs: Neurological: He is alert and oriented to person, place, and time.  Skin: Skin is warm and dry.  Nursing  note and vitals reviewed.   ED Course  Procedures (including critical care time)  Labs Review Labs Reviewed - No data to display  Imaging Review No results found.   Visual Acuity Review  Right Eye Distance:   Left Eye Distance:   Bilateral Distance:    Right Eye Near:   Left Eye Near:    Bilateral Near:         MDM   1. Acute idiopathic gout of left knee        Billy Fischer, MD 04/13/15 1400

## 2015-04-13 NOTE — ED Notes (Signed)
C/o medication refill States he has left knee pain and right ankle pain States unable to lift leg

## 2015-04-13 NOTE — Discharge Instructions (Signed)
See your orthopedist for further eval of knee problem.

## 2015-04-14 ENCOUNTER — Other Ambulatory Visit (HOSPITAL_COMMUNITY): Payer: Self-pay

## 2015-04-14 ENCOUNTER — Ambulatory Visit (HOSPITAL_COMMUNITY): Payer: 59

## 2015-04-14 NOTE — ED Notes (Signed)
Pt  Called  Stating      Needed  colchine         Pre   -   Authorization       Dr  Zollie Scale  With         Pharmacist        At  [facility]

## 2015-04-21 ENCOUNTER — Ambulatory Visit (HOSPITAL_COMMUNITY)
Admission: RE | Admit: 2015-04-21 | Discharge: 2015-04-21 | Disposition: A | Discharge status: Home / Self Care | Payer: 59 | Source: Ambulatory Visit | Attending: Internal Medicine | Admitting: Internal Medicine

## 2015-04-21 ENCOUNTER — Other Ambulatory Visit: Payer: Self-pay | Admitting: Neurological Surgery

## 2015-04-21 DIAGNOSIS — I5022 Chronic systolic (congestive) heart failure: Secondary | ICD-10-CM | POA: Diagnosis present

## 2015-04-21 DIAGNOSIS — M5412 Radiculopathy, cervical region: Secondary | ICD-10-CM

## 2015-04-21 LAB — CREATININE, SERUM
CREATININE: 1.43 mg/dL — AB (ref 0.61–1.24)
GFR calc Af Amer: >60 mL/min (ref >60)
GFR, EST NON AFRICAN AMERICAN: 55 mL/min — AB (ref >60)

## 2015-04-21 MED ORDER — GADOBENATE DIMEGLUMINE 529 MG/ML IV SOLN
36.0000 mL | Freq: Once | INTRAVENOUS | Status: AC | PRN
  Administered 2015-04-21: 36 mL via INTRAVENOUS

## 2015-05-02 ENCOUNTER — Ambulatory Visit
Admission: RE | Admit: 2015-05-02 | Discharge: 2015-05-02 | Disposition: A | Discharge status: Home / Self Care | Payer: 59 | Source: Ambulatory Visit | Attending: Neurological Surgery | Admitting: Neurological Surgery

## 2015-05-02 VITALS — BP 131/77 | HR 56

## 2015-05-02 DIAGNOSIS — M5030 Other cervical disc degeneration, unspecified cervical region: Secondary | ICD-10-CM

## 2015-05-02 DIAGNOSIS — M5412 Radiculopathy, cervical region: Secondary | ICD-10-CM

## 2015-05-02 MED ORDER — IOHEXOL 300 MG/ML  SOLN
1.0000 mL | Freq: Once | INTRAMUSCULAR | Status: DC | PRN
  Administered 2015-05-02: 1 mL via EPIDURAL

## 2015-05-02 MED ORDER — TRIAMCINOLONE ACETONIDE 40 MG/ML IJ SUSP (RADIOLOGY)
60.0000 mg | Freq: Once | INTRAMUSCULAR | Status: AC
  Administered 2015-05-02: 60 mg via EPIDURAL

## 2015-05-02 NOTE — Discharge Instructions (Signed)

## 2015-05-16 ENCOUNTER — Encounter: Payer: Self-pay | Admitting: Emergency Medicine

## 2015-05-18 DIAGNOSIS — I5022 Chronic systolic (congestive) heart failure: Secondary | ICD-10-CM | POA: Diagnosis not present

## 2015-06-19 ENCOUNTER — Other Ambulatory Visit (HOSPITAL_COMMUNITY): Payer: Self-pay | Admitting: Neurological Surgery

## 2015-06-19 ENCOUNTER — Other Ambulatory Visit: Payer: Self-pay | Admitting: Neurological Surgery

## 2015-06-19 DIAGNOSIS — M5412 Radiculopathy, cervical region: Secondary | ICD-10-CM

## 2015-07-13 ENCOUNTER — Ambulatory Visit (HOSPITAL_COMMUNITY): Admission: RE | Admit: 2015-07-13 | Payer: 59 | Source: Ambulatory Visit

## 2015-07-16 ENCOUNTER — Ambulatory Visit (INDEPENDENT_AMBULATORY_CARE_PROVIDER_SITE_OTHER): Payer: 59 | Admitting: Family Medicine

## 2015-07-16 VITALS — BP 144/82 | HR 86 | Temp 98.4°F | Resp 20 | Ht 72.5 in | Wt 239.2 lb

## 2015-07-16 DIAGNOSIS — M10071 Idiopathic gout, right ankle and foot: Secondary | ICD-10-CM | POA: Diagnosis not present

## 2015-07-16 DIAGNOSIS — M109 Gout, unspecified: Secondary | ICD-10-CM

## 2015-07-16 MED ORDER — PREDNISONE 20 MG PO TABS: ORAL_TABLET | ORAL | Status: DC

## 2015-07-16 MED ORDER — COLCHICINE 0.6 MG PO TABS: ORAL_TABLET | ORAL | Status: DC

## 2015-07-16 NOTE — Progress Notes (Signed)
Subjective:  This chart was scribed for Jeremiah Sorenson, MD by Stann Ore, Medical Scribe. This patient was seen in Room 1 and the patient's care was started 3:59 PM.   Patient ID: Jeremiah Webb, male    DOB: 12-May-1961, 54 y.o.   MRN: [id number] Chief Complaint  Patient presents with  . Foot Pain    x 4-5 days ago. The pain and swelling gets worse. Can't put any pressure on it. -NKI-   HPI Jeremiah Webb is a 54 y.o. male who h/o gout presents to [facility] complaining of right foot pain that started 4 days ago. He notes that the pain and swelling have gotten worse. Initially, he felt like having a knot at the arch of the plantar surface. It radiated up the medial arch of the foot and now lateral side of the foot. He can't put any pressure on it without pain. He's taken advil 4 times a day and has been drinking cherry juice. He's also taken indomethacin without relief. He's had gout in the past, and taken colchicine and prednisone for relief. He denies any knowledge of injury to the area. He denies chills, fever and bowel symptoms.   His gout flare ups have been very seldom. His previous flare up was after drinking 2 flasks of crown apple during an event.    He notes having steak recently. He does have occasional red meat. He drinks plenty of water. He denies beer intake.  He currently utilizes crutches for mobility.   Past Medical History  Diagnosis Date  . Thyroid disease     Hypothyroid  . Hypothyroidism   . Sleep apnea     no cpap   Prior to Admission medications   Medication Sig Start Date End Date Taking? Authorizing Provider  levothyroxine (SYNTHROID, LEVOTHROID) 100 MCG tablet Take 1 tablet (100 mcg total) by mouth daily. 02/12/15  Yes Tonye Pearson, MD   Allergies  Allergen Reactions  . Shrimp [Shellfish Allergy]     Only shrimp causes indigestion. No epi-pen necessary.   Review of Systems  Constitutional: Negative for fever, chills, diaphoresis and fatigue.    Cardiovascular: Negative for chest pain.  Gastrointestinal: Negative for nausea, vomiting, diarrhea and constipation.  Musculoskeletal: Positive for myalgias (right foot), arthralgias (right foot) and gait problem.  Skin: Negative for rash and wound.       Objective:   Physical Exam  Constitutional: He is oriented to person, place, and time. He appears well-developed and well-nourished. No distress.  HENT:  Head: Normocephalic and atraumatic.  Eyes: EOM are normal. Pupils are equal, round, and reactive to light.  Neck: Neck supple.  Cardiovascular: Normal rate.   Pulmonary/Chest: Effort normal. No respiratory distress.  Musculoskeletal: Normal range of motion.  Right foot: erythema and mildly tender with mild edema over lateral mid foot approximately 5cm in diameter  Neurological: He is alert and oriented to person, place, and time.  Skin: Skin is warm and dry.  Right foot: Warmth to touch  Psychiatric: He has a normal mood and affect. His behavior is normal.  Nursing note and vitals reviewed.   BP 144/82 mmHg  Pulse 86  Temp(Src) 98.4 F (36.9 C) (Oral)  Resp 20  Ht 6' 0.5" (1.842 m)  Wt 239 lb 3.2 oz (108.5 kg)  BMI 31.98 kg/m2  SpO2 97%     Assessment & Plan:   1. Acute gout of right foot, unspecified cause   Reviewed gout diet in detail. Pt has  responded very well to pred/colcrys combo so retry. RTC immed for any increased pain, redness, swelling, or purulent drainage.  Reviewed considering use of allopurinol for gout suppression - he had flairs rarely until recently so will want to try if gout flairs continue.    Meds ordered this encounter  Medications  . colchicine 0.6 MG tablet    Sig: Take 2 tabs po x 1 now, take 1 tab an hour later    Dispense:  30 tablet    Refill:  0  . predniSONE (DELTASONE) 20 MG tablet    Sig: Take 3 tabs po qd x 3d, take 2 tabs po qd x 2d, then 1 tab po qd x 2d    Dispense:  12 tablet    Refill:  0    I personally performed the  services described in this documentation, which was scribed in my presence. The recorded information has been reviewed and considered, and addended by me as needed.  Jeremiah Sorenson, MD MPH    By signing my name below, I, Stann Ore, attest that this documentation has been prepared under the direction and in the presence of Jeremiah Sorenson, MD. Electronically Signed: Stann Ore, Scribe. 07/16/2015 , 3:59 PM .

## 2015-07-16 NOTE — Patient Instructions (Signed)
You can reduce your uric acid level by avoiding red meat, saturated fats (dairy fats, butter fats, animal fats), high fructose corn syrup, and beer and drinking more water. There are some easy diet adjustments that you can make to lower your blood uric acid level and thereby hopefully never have to suffer from another gout flair (or at least less frequently).  You should avoid alcohol, drink plenty of water, and try to follow a "low purine" diet as your body produces uric acid when it breaks down prurines-substances that are found naturally in your body, as well as in certain foods such as organ meats, anchioves, herring, asparagus, and mushrooms. Also, increasing your diet in certain foods that may lower uric acid levels is a pretty safe way to decrease your likelihood of gout so consider drinking coffee (regular and/or decaf), eating fruits with Vitamin C in them such as citrus fruits, strawberries, broccoli,  brussel sprouts, papaya, and cantaloupe (though megadoses of vitamin C supplements may do the opposite and increase your body's uric acid levels), and/or eating more cherries and other dark-colored fruits, such as blackberries, blueberries, purple grapes and raspberries. In addition, getting plenty of vitamin A though yellow fruits, or dark green/yellow vegetables at least every other day is good.  Other general diet guidelines for people with gout who need to lower their blood uric acid levels are as follows:  Drink 8 to 16 cups ( about 2 to 4 liters) of fluid each day, with at least half being water. Eat a moderate amount of protein, preferably from healthy sources, such as low-fat or fat-free dairy, tofu, eggs, and nut butters. Limit your daily intake of meat, fish, and poultry to 4 to 6 ounces. Avoid high fat meats and desserts. Decrease your intake of shellfish, beef, lamb, pork, eggs and cheese. Avoid drastic weight reduction or fasting.  If weight loss is desired lose it over a period of  several months. Information for patients with Gout  Gout defined-Gout occurs when urate crystals accumulate in your joint causing the inflammation and intense pain of gout attack.  Urate crystals can form when you have high levels of uric acid in your blood.  Your body produces uric acid when it breaks down prurines-substances that are found naturally in your body, as well as in certain foods such as organ meats, anchioves, herring, asparagus, and mushrooms.  Normally uric acid dissolves in your blood and passes through your kidneys into your urine.  But sometimes your body either produces too much uric acid or your kidneys excrete too little uric acid.  When this happens, uric acid can build up, forming sharp needle-like urate crystals in a joint or surrounding tissue that cause pain, inflammation and swelling.    Gout is characterized by sudden, severe attacks of pain, redness and tenderness in joints, often the joint at the base of the big toe.  Gout is complex form of arthritis that can affect anyone.  Men are more likely to get gout but women become increasingly more susceptible to gout after menopause.  An acute attack of gout can wake you up in the middle of the night with the sensation that your big toe is on fire.  The affected joint is hot, swollen and so tender that even the weight or the sheet on it may seem intolerable.  If you experience symptoms of an acute gout attack it is important to your doctor as soon as the symptoms start.  Gout that goes untreated can lead  to worsening pain and joint damage.  Risk Factors:  You are more likely to develop gout if you have high levels of uric acid in your body.    Factors that increase the uric acid level in your body include:  Lifestyle factors.  Excessive alcohol use-generally more than two drinks a day for men and more than one for women increase the risk of gout.  Medical conditions.  Certain conditions make it more likely that you will  develop gout.  These include hypertension, and chronic conditions such as diabetes, high levels of fat and cholesterol in the blood, and narrowing of the arteries.  Certain medications.  The uses of Thiazide diuretics- commonly used to treat hypertension and low dose aspirin can also increase uric acid levels.  Family history of gout.  If other members of your family have had gout, you are more likely to develop the disease.  Age and sex. Gout occurs more often in men than it does in women, primarily because women tend to have lower uric acid levels than men do.  Men are more likely to develop gout earlier usually between the ages of 63-50- whereas women generally develop signs and symptoms after menopause.    Tests and diagnosis:  Tests to help diagnose gout may include:  Blood test.  Your doctor may recommend a blood test to measure the uric acid level in your blood .  Blood tests can be misleading, though.  Some people have high uric acid levels but never experience gout.  And some people have signs and symptoms of gout, but don't have unusual levels of uric acid in their blood.  Joint fluid test.  Your doctor may use a needle to draw fluid from your affected joint.  When examined under the microscope, your joint fluid may reveal urate crystals.  Treatment:  Treatment for gout usually involves medications.  What medications you and your doctor choose will be based on your current health and other medications you currently take.  Gout medications can be used to treat acute gout attacks and prevent future attacks as well as reduce your risk of complications from gout such as the development of tophi from urate crystal deposits.  Alternative medicine:   Certain foods have been studied for their potential to lower uric acid levels, including:  Coffee.  Studies have found an association between coffee drinking (regular and decaf) and lower uric acid levels.  The evidence is not enough to  encourage non-coffee drinkers to start, but it may give clues to new ways of treating gout in the future.  Vitamin C.  Supplements containing vitamin C may reduce the levels of uric acid in your blood.  However, vitamin as a treatment for gout. Don't assume that if a little vitamin C is good, than lots is better.  Megadoses of vitamin C may increase your bodies uric acid levels.  Cherries.  Cherries have been associated with lower levels of uric acid in studies, but it isn't clear if they have any effect on gout signs and symptoms.  Eating more cherries and other dar-colored fruits, such as blackberries, blueberries, purple grapes and raspberries, may be a safe way to support your gout treatment.    Lifestyle/Diet Recommendations:   Drink 8 to 16 cups ( about 2 to 4 liters) of fluid each day, with at least half being water.  Avoid alcohol  Eat a moderate amount of protein, preferably from healthy sources, such as low-fat or fat-free dairy, tofu, eggs,  and nut butters.  Limit you daily intake of meat, fish, and poultry to 4 to 6 ounces.  Avoid high fat meats and desserts.  Decrease you intake of shellfish, beef, lamb, pork, eggs and cheese.  Choose a good source of vitamin C daily such as citrus fruits, strawberries, broccoli,  brussel sprouts, papaya, and cantaloupe.   Choose a good source of vitamin A every other day such as yellow fruits, or dark green/yellow vegetables.  Avoid drastic weigh reduction or fasting.  If weigh loss is desired lose it over a period of several months.  See "dietary considerations.." chart for specific food recommendations.  Dietary Considerations for people with Gout  Food with negligible purine content (0-15 mg of purine nitrogen per 100 grams food)  May use as desired except on calorie variations  Non fat milk Cocoa Cereals (except in list II) Hard candies  Buttermilk Carbonated drinks Vegetables (except in list II) Sherbet  Coffee Fruits Sugar Honey   Tea Cottage Cheese Gelatin-jell-o Salt  Fruit juice Breads Angel food Cake   Herbs/spices Jams/Jellies [employer]    Foods that do not contain excessive purine content, but must be limited due to fat content  Cream Eggs Oil and Salad Dressing  Half and Half Peanut Butter Chocolate  Whole Milk Cakes Potato Chips  Butter Ice Cream Fried Foods  Cheese Nuts Waffles, pancakes   List II: Food with moderate purine content (50-150 mg of purine nitrogen per 100 grams of food)  Limit total amount each day to 5 oz. cooked Lean meat, other than those on list III   Poultry, other than those on list III Fish, other than those on list III   Seafood, other than those on list III  These foods may be used occasionally  Peas Lentils Bran  Spinach Oatmeal Dried Beans and Peas  Asparagus Wheat Germ Mushrooms   Additional information about meat choices  Choose fish and poultry, particularly without skin, often.  Select lean, well trimmed cuts of meat.  Avoid all fatty meats, bacon , sausage, fried meats, fried fish, or poultry, luncheon meats, cold cuts, hot dogs, meats canned or frozen in gravy, spareribs and frozen and packaged prepared meats.   List III: Foods with HIGH purine content / Foods to AVOID (150-800 mg of purine nitrogen per 100 grams of food)  Anchovies Herring Meat Broths  Liver Mackerel Meat Extracts  Kidney Scallops Meat Drippings  Sardines Wild Game Mincemeat  Sweetbreads Goose Gravy  Heart Tongue Yeast, baker's and brewers   Commercial soups made with any of the foods listed in List II or List III  In addition avoid all alcoholic beverages

## 2015-07-21 ENCOUNTER — Ambulatory Visit (INDEPENDENT_AMBULATORY_CARE_PROVIDER_SITE_OTHER): Payer: 59 | Admitting: Family Medicine

## 2015-07-21 ENCOUNTER — Telehealth: Payer: Self-pay | Admitting: *Deleted

## 2015-07-21 VITALS — BP 136/92 | HR 67 | Temp 98.2°F | Resp 16 | Ht 72.0 in | Wt 239.0 lb

## 2015-07-21 DIAGNOSIS — E039 Hypothyroidism, unspecified: Secondary | ICD-10-CM | POA: Diagnosis not present

## 2015-07-21 DIAGNOSIS — H1031 Unspecified acute conjunctivitis, right eye: Secondary | ICD-10-CM | POA: Diagnosis not present

## 2015-07-21 DIAGNOSIS — N289 Disorder of kidney and ureter, unspecified: Secondary | ICD-10-CM | POA: Diagnosis not present

## 2015-07-21 NOTE — Telephone Encounter (Signed)
[facility] called and stated pt has not shown up yet.  She stated that she will call the pt.    Per Dr. Clelia Croft if pt does not show up there then he needs to come back for a full evaluation of his eye.

## 2015-07-21 NOTE — Progress Notes (Signed)
Subjective:  This chart was scribed for Jeremiah Sorenson, MD by Stann Ore, Medical Scribe. This patient was seen in Room 9 and the patient's care was started 12:56 PM.   Patient ID: Jeremiah Webb, male    DOB: Jan 15, 1961, 54 y.o.   MRN: [id number] Chief Complaint  Patient presents with  . Eye Problem    right eye pain, redness  . blood work    TSH    HPI Jeremiah Webb is a 54 y.o. male who presents to [facility] complaining of sudden onset right eye pain with redness started this morning.  He went to sleep last night, and his eyes were fine. Right now, his eye is very sensitive to light and motion. He denies putting any eye drops in his right eye. There's been some drainage from the right eye too.   He notes nasal rhinorrhea from his right nostril as well. He had a headache and has taken Excedrin.   Past Medical History  Diagnosis Date  . Thyroid disease     Hypothyroid  . Hypothyroidism   . Sleep apnea     no cpap   Prior to Admission medications   Medication Sig Start Date End Date Taking? Authorizing Provider  colchicine 0.6 MG tablet Take 2 tabs po x 1 now, take 1 tab an hour later 07/16/15  Yes Sherren Mocha, MD  levothyroxine (SYNTHROID, LEVOTHROID) 100 MCG tablet Take 1 tablet (100 mcg total) by mouth daily. 02/12/15  Yes Tonye Pearson, MD   Allergies  Allergen Reactions  . Shrimp [Shellfish Allergy]     Only shrimp causes indigestion. No epi-pen necessary.    Review of Systems  Constitutional: Negative for fever, chills and fatigue.  HENT: Positive for rhinorrhea.   Eyes: Positive for photophobia, pain, discharge and redness.  Gastrointestinal: Negative for nausea, vomiting, diarrhea and constipation.  Neurological: Positive for headaches. Negative for numbness.       Objective:   Physical Exam  Constitutional: He is oriented to person, place, and time. He appears well-developed and well-nourished. No distress.  HENT:  Head: Normocephalic and atraumatic.  Eyes:  Pupils are equal, round, and reactive to light. Right eye exhibits no chemosis. Right conjunctiva is injected (severe).  Constriction appropriate, increased clear tearing  Neck: Neck supple.  Cardiovascular: Normal rate.   Pulmonary/Chest: Effort normal. No respiratory distress.  Musculoskeletal: Normal range of motion.  Neurological: He is alert and oriented to person, place, and time.  Skin: Skin is warm and dry.  Psychiatric: He has a normal mood and affect. His behavior is normal.  Nursing note and vitals reviewed.   BP 136/92 mmHg  Pulse 67  Temp(Src) 98.2 F (36.8 C) (Oral)  Resp 16  Ht 6' (1.829 m)  Wt 239 lb (108.41 kg)  BMI 32.41 kg/m2  SpO2 97%    Visual Acuity Screening   Right eye Left eye Both eyes  Without correction:  With correction:         Assessment & Plan:   1. Acute conjunctivitis, right eye - I am highly concerned by the severity and suddenness of the right eye onset - no discharge or chemosis as would be expected by an infectious conjunctivitis. I am concerned about the sensation of global deep pressure, severe photophobia, and reduced vision - I am concerned he could have an iritis, acute narrow angle glaucoma or other with it being Friday afternoon so have asked opthalmology to work pt in acutely  this afternoon -  Colleague at Dr. Laruth Bouchard office has very graciously agreed to see pt this afternoon.  2. Hypothyroidism, unspecified hypothyroidism type - Has been under replaced for over past yr - last 3 mos ago tsh still very uncontrolled at 30 so started on 100 mcg - recheck today then refill levothyroxine after lab has returned  3. Decreased renal function at last labs 3 mos prior - recheck, no h/o hypertension - bp likely elevated today due to pain    Orders Placed This Encounter  Procedures  . TSH  . Basic metabolic panel    Order Specific Question:  Has the patient fasted?    Answer:  No  . Ambulatory referral to Ophthalmology     Referral Priority:  Urgent    Referral Type:  Consultation    Referral Reason:  Specialty Services Required    Requested Specialty:  Ophthalmology    Number of Visits Requested:  1     I personally performed the services described in this documentation, which was scribed in my presence. The recorded information has been reviewed and considered, and addended by me as needed.  Jeremiah Sorenson, MD MPH    By signing my name below, I, Stann Ore, attest that this documentation has been prepared under the direction and in the presence of Jeremiah Sorenson, MD. Electronically Signed: Stann Ore, Scribe. 07/21/2015 , 12:56 PM .

## 2015-07-21 NOTE — Patient Instructions (Signed)
HEAD OVER TO [facility] NOW AND YOU WILL BE SEEN BY DR Lorin Picket Address: [address] [place], [place], [place] [address] Phone: [phone]

## 2015-07-22 LAB — BASIC METABOLIC PANEL
BUN: 19 mg/dL (ref 7–25)
CALCIUM: 9.3 mg/dL (ref 8.6–10.3)
CO2: 24 mmol/L (ref 20–31)
Chloride: 105 mmol/L (ref 98–110)
Creat: 1.21 mg/dL (ref 0.70–1.33)
Glucose, Bld: 136 mg/dL — ABNORMAL HIGH (ref 65–99)
Potassium: 3.7 mmol/L (ref 3.5–5.3)
SODIUM: 142 mmol/L (ref 135–146)

## 2015-07-22 LAB — TSH: TSH: 0.727 u[IU]/mL (ref 0.350–4.500)

## 2015-07-31 ENCOUNTER — Other Ambulatory Visit (HOSPITAL_COMMUNITY): Payer: Self-pay | Admitting: Neurological Surgery

## 2015-07-31 ENCOUNTER — Other Ambulatory Visit: Payer: Self-pay | Admitting: Neurological Surgery

## 2015-07-31 DIAGNOSIS — M5412 Radiculopathy, cervical region: Secondary | ICD-10-CM

## 2015-08-18 ENCOUNTER — Telehealth: Payer: Self-pay

## 2015-08-18 NOTE — Telephone Encounter (Signed)
Has gout flare up in same ankle -   Asking for alprenolol (new) as discussed with Dr. Clelia Croft in December OV  and prednisone (current) .    [facility] [address].   [phone]

## 2015-08-22 MED ORDER — ALLOPURINOL 300 MG PO TABS
300.0000 mg | ORAL_TABLET | Freq: Every day | ORAL | Status: DC
Start: 2015-08-22 — End: 2015-09-22

## 2015-08-22 MED ORDER — PREDNISONE 10 MG PO TABS: ORAL_TABLET | ORAL | Status: DC

## 2015-08-22 NOTE — Telephone Encounter (Signed)
meds sent in, pt notified of need for OV for any additional refills in below Mychart message.

## 2015-08-23 ENCOUNTER — Ambulatory Visit (HOSPITAL_COMMUNITY): Admission: RE | Admit: 2015-08-23 | Payer: BLUE CROSS/BLUE SHIELD | Source: Ambulatory Visit

## 2015-08-29 ENCOUNTER — Other Ambulatory Visit: Payer: Self-pay | Admitting: Neurological Surgery

## 2015-08-29 ENCOUNTER — Telehealth: Payer: Self-pay

## 2015-08-29 ENCOUNTER — Other Ambulatory Visit (HOSPITAL_COMMUNITY): Payer: Self-pay | Admitting: Neurological Surgery

## 2015-08-29 DIAGNOSIS — M5412 Radiculopathy, cervical region: Secondary | ICD-10-CM

## 2015-08-29 MED ORDER — LEVOTHYROXINE SODIUM 100 MCG PO TABS: 100.0000 ug | ORAL_TABLET | Freq: Every day | ORAL | Status: DC

## 2015-08-29 NOTE — Telephone Encounter (Signed)
Done

## 2015-08-29 NOTE — Telephone Encounter (Signed)
Patient is calling to request a 6 month refill for levothyroxine. [facility] on [address]

## 2015-08-31 ENCOUNTER — Telehealth: Payer: Self-pay

## 2015-08-31 NOTE — Telephone Encounter (Signed)
levothyroxine (SYNTHROID, LEVOTHROID) 100 MCG tablet [[id number]]      Order Details    Dose: 100 mcg Route: Oral Frequency: Daily   Dispense Quantity:  90 tablet Refills:  1 Fills Remaining:  1          Sig: Take 1 tablet (100 mcg total) by mouth daily.         Written Date:  08/29/15 Expiration Date:  08/28/16     Start Date:  08/29/15 End Date:  --     Ordering Provider:  Collene Gobble, MD Authorizing Provider:  Collene Gobble, MD Ordering User:  Cydney Ok, CMA         Is he supposed to be taking 150 mcg?

## 2015-08-31 NOTE — Telephone Encounter (Signed)
Please forward this to Dr. Clelia Croft in that she was the last patient to write for his prescriptions and his last TSH was normal. She needs to be in charge of his dosage. I cannot see that I have seen him since we have been on Epic.

## 2015-08-31 NOTE — Telephone Encounter (Signed)
He needs to stay on whatever dose he was taking in Nov and Dec -  I had thought he was just on 171mcg=1 tab qd but if has been taking 1 1/2 tabs we can certainly change pill mg to 150.  Please send in a 9 mo supply and see if pt would like to sched a f/u OV in 6 mos to recheck thyroid/refill meds.

## 2015-08-31 NOTE — Telephone Encounter (Signed)
Pt needs a refill on his synthroid and to make sure the dosage is 150 to where he is able to take one tablet at a time not one and a half   Best number [phone]   [facility] [address]

## 2015-09-01 MED ORDER — LEVOTHYROXINE SODIUM 150 MCG PO TABS: 150.0000 ug | ORAL_TABLET | Freq: Every day | ORAL | Status: DC

## 2015-09-01 NOTE — Telephone Encounter (Signed)
Spoke with pt, advised message. Pt understood. 

## 2015-09-09 ENCOUNTER — Ambulatory Visit (INDEPENDENT_AMBULATORY_CARE_PROVIDER_SITE_OTHER): Payer: BLUE CROSS/BLUE SHIELD

## 2015-09-09 ENCOUNTER — Ambulatory Visit (INDEPENDENT_AMBULATORY_CARE_PROVIDER_SITE_OTHER): Payer: BLUE CROSS/BLUE SHIELD | Admitting: Family Medicine

## 2015-09-09 VITALS — BP 120/70 | HR 83 | Temp 98.4°F | Resp 17 | Ht 75.0 in | Wt 235.0 lb

## 2015-09-09 DIAGNOSIS — M25571 Pain in right ankle and joints of right foot: Secondary | ICD-10-CM | POA: Diagnosis not present

## 2015-09-09 DIAGNOSIS — Z23 Encounter for immunization: Secondary | ICD-10-CM | POA: Diagnosis not present

## 2015-09-09 DIAGNOSIS — M25471 Effusion, right ankle: Secondary | ICD-10-CM

## 2015-09-09 LAB — COMPREHENSIVE METABOLIC PANEL
ALBUMIN: 3.9 g/dL (ref 3.6–5.1)
ALK PHOS: 55 U/L (ref 40–115)
ALT: 95 U/L — ABNORMAL HIGH (ref 9–46)
AST: 52 U/L — AB (ref 10–35)
BILIRUBIN TOTAL: 0.7 mg/dL (ref 0.2–1.2)
BUN: 19 mg/dL (ref 7–25)
CALCIUM: 9.6 mg/dL (ref 8.6–10.3)
CO2: 29 mmol/L (ref 20–31)
Chloride: 106 mmol/L (ref 98–110)
Creat: 1.39 mg/dL — ABNORMAL HIGH (ref 0.70–1.33)
Glucose, Bld: 107 mg/dL — ABNORMAL HIGH (ref 65–99)
Potassium: 4.8 mmol/L (ref 3.5–5.3)
Sodium: 141 mmol/L (ref 135–146)
TOTAL PROTEIN: 7 g/dL (ref 6.1–8.1)

## 2015-09-09 LAB — POCT CBC
GRANULOCYTE PERCENT: 74.3 % (ref 37–80)
HCT, POC: 43.1 % — AB (ref 43.5–53.7)
HEMOGLOBIN: 14.6 g/dL (ref 14.1–18.1)
Lymph, poc: 2.2 (ref 0.6–3.4)
MCH, POC: 30.3 pg (ref 27–31.2)
MCHC: 33.8 g/dL (ref 31.8–35.4)
MCV: 89.5 fL (ref 80–97)
MID (cbc): 0.3 (ref 0–0.9)
MPV: 6.5 fL (ref 0–99.8)
POC GRANULOCYTE: 7.1 — AB (ref 2–6.9)
POC LYMPH PERCENT: 22.4 %L (ref 10–50)
POC MID %: 3.3 %M (ref 0–12)
Platelet Count, POC: 324 10*3/uL (ref 142–424)
RBC: 4.81 M/uL (ref 4.69–6.13)
RDW, POC: 12.9 %
WBC: 9.6 10*3/uL (ref 4.6–10.2)

## 2015-09-09 LAB — URIC ACID: Uric Acid, Serum: 7.6 mg/dL (ref 4.0–7.8)

## 2015-09-09 LAB — POCT SEDIMENTATION RATE: POCT SED RATE: 2 mm/h (ref 0–22)

## 2015-09-09 MED ORDER — OXYCODONE-ACETAMINOPHEN 5-325 MG PO TABS: 1.0000 | ORAL_TABLET | Freq: Four times a day (QID) | ORAL | Status: DC | PRN

## 2015-09-09 MED ORDER — DICLOFENAC SODIUM 75 MG PO TBEC: 75.0000 mg | DELAYED_RELEASE_TABLET | Freq: Two times a day (BID) | ORAL | Status: DC

## 2015-09-09 MED ORDER — PREDNISONE 20 MG PO TABS: ORAL_TABLET | ORAL | Status: DC

## 2015-09-09 NOTE — Discharge Instructions (Signed)
Because you received an x-ray today, you will receive an invoice from [facility]. Please contact [facility] at [phone] with questions or concerns regarding your invoice. Our billing staff will not be able to assist you with those questions. °

## 2015-09-09 NOTE — Progress Notes (Signed)
Subjective:    Patient ID: Jeremiah Webb, male    DOB: Dec 28, 1960, 55 y.o.   MRN: [id number]  09/09/2015  Ankle Pain and Immunizations   HPI This 55 y.o. male presents for re-evaluation of R ankle pain and swelling.  Recently evaluated by Dr. Brigitte Pulse on 07/16/15; dx with gouty attack and prescribed Allopurinol, Prednisone.  No injury prior to R ankle symptoms started.  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  Concerned about injury because pain returns immediately when stops Prednisone.  Finished last tablet two days ago with immediate return of pain.  Requesting blood work for uric acid. Also wanting MRI scan of ankle; having to use crutches due to severity of pain; recently got crutches with quad tendon rupture three years ago.  Injury of ankles with basketball years ago B ankles.Took Advil 87m and hydroocodone 132mlast night with no improvement in pain; took another hydrocodone this morning prior to visit with no improvement in pain.  Requesting oxycontin or morphine product for pain.  Has Colcrys at home; not sure if Colcrys helps with gout attacks. As not taken colcrys in the past month.  Gout diagnosis in past; previous uric acid two years ago 10.0; then eleven months ago uric acid 7.3. No formal diagnosis of gout.  Dr. ShBrigitte Pulserescribed Allopurinol with additional prednisone two weeks ago.  Two rounds of prednisone in past two months.     Orthopedic specialist from the past Dr. CoTheda Sers Likes MuRaliegh Ip  Review of Systems  Constitutional: Negative for fever, chills, diaphoresis, activity change, appetite change and fatigue.  Eyes: Negative for visual disturbance.  Respiratory: Negative for cough and shortness of breath.   Cardiovascular: Negative for chest pain, palpitations and leg swelling.  Endocrine: Negative for cold intolerance, heat intolerance, polydipsia, polyphagia and polyuria.  Musculoskeletal: Positive for  joint swelling, arthralgias and gait problem.  Neurological: Negative for dizziness, tremors, seizures, syncope, facial asymmetry, speech difficulty, weakness, light-headedness, numbness and headaches.    Past Medical History  Diagnosis Date  . Thyroid disease     Hypothyroid  . Hypothyroidism   . Sleep apnea     no cpap   Past Surgical History  Procedure Laterality Date  . Quadriceps tendon repair    . Cervical disc surgery      #5  . Posterior cervical laminectomy with met- rx Left 09/20/2014    Procedure: Left C7-T1 "Sitting" Microdiskectomy;  Surgeon: HeKristeen MissMD;  [facility];  Service: Neurosurgery;  Laterality: Left;  Left C7-T1 "Sitting" Microdiskectomy  . Shoulder surgery     Allergies  Allergen Reactions  . Shrimp [Shellfish Allergy]     Only shrimp causes indigestion. No epi-pen necessary.    Social History   Social History  . Marital Status: Single    Spouse Name: N/A  . Number of Children: N/A  . Years of Education: N/A   Occupational History  . Not on file.   Social History Main Topics  . Smoking status: Never Smoker   . Smokeless tobacco: Never Used  . Alcohol Use: 0.0 oz/week    0 Standard drinks or equivalent per week     Comment: occasional  . Drug Use: No  . Sexual Activity: Not on file   Other Topics Concern  . Not on file   Social History Narrative   Family History  Problem Relation Age of Onset  . Heart disease Mother   .  Stroke Mother   . Diabetes Father   . Colon cancer Maternal Grandfather   . Esophageal cancer Neg Hx   . Rectal cancer Neg Hx   . Stomach cancer Neg Hx        Objective:    BP 120/70 mmHg  Pulse 83  Temp(Src) 98.4 F (36.9 C) (Oral)  Resp 17  Ht _0  (1.905 m)  Wt 235 lb (106.595 kg)  BMI 29.37 kg/m2  SpO2 98% Physical Exam  Constitutional: He is oriented to person, place, and time. He appears well-developed and well-nourished. No distress.  HENT:  Head: Normocephalic and atraumatic.    Eyes: Conjunctivae and EOM are normal. Pupils are equal, round, and reactive to light.  Neck: Normal range of motion. Neck supple. Carotid bruit is not present.  Cardiovascular: Intact distal pulses.   DP pulse 2+ on R foot; capillary refill < 3 seconds.  Pulmonary/Chest: Effort normal and breath sounds normal. He has no wheezes. He has no rales.  Abdominal: Soft. Bowel sounds are normal.  Musculoskeletal:       Right ankle: He exhibits decreased range of motion and swelling. He exhibits no ecchymosis, no deformity, no laceration and normal pulse. Tenderness. Lateral malleolus tenderness found. No medial malleolus, no AITFL, no CF ligament, no posterior TFL, no head of 5th metatarsal and no proximal fibula tenderness found. Achilles tendon exhibits no pain and no defect.       Right lower leg: Normal. He exhibits no tenderness, no bony tenderness, no swelling, no edema, no deformity and no laceration.       Right foot: There is normal range of motion, no tenderness, no bony tenderness, no swelling, normal capillary refill, no crepitus, no deformity and no laceration.  R ankle:  +localized lateral ankle swelling with TTP; painful ROM in all directions; +TTP lateral to Achille's tendon insertion.  No warmth or redness to area of localized swelling.  Neurological: He is alert and oriented to person, place, and time. No cranial nerve deficit.  Skin: Skin is warm and dry. No rash noted. He is not diaphoretic.  Psychiatric: He has a normal mood and affect. His behavior is normal.  Nursing note and vitals reviewed.  Results for orders placed or performed in visit on 09/09/15  POCT CBC  Result Value Ref Range   WBC 9.6 4.6 - 10.2 K/uL   Lymph, poc 2.2 0.6 - 3.4   POC LYMPH PERCENT 22.4 10 - 50 %L   MID (cbc) 0.3 0 - 0.9   POC MID % 3.3 0 - 12 %M   POC Granulocyte 7.1 (A) 2 - 6.9   Granulocyte percent 74.3 37 - 80 %G   RBC 4.81 4.69 - 6.13 M/uL   Hemoglobin 14.6 14.1 - 18.1 g/dL   HCT, POC 43.1  (A) 43.5 - 53.7 %   MCV 89.5 80 - 97 fL   MCH, POC 30.3 27 - 31.2 pg   MCHC 33.8 31.8 - 35.4 g/dL   RDW, POC 12.9 %   Platelet Count, POC 324 142 - 424 K/uL   MPV 6.5 0 - 99.8 fL  POCT SEDIMENTATION RATE  Result Value Ref Range   POCT SED RATE 2 0 - 22 mm/hr   Dg Ankle Complete Right  09/09/2015  CLINICAL DATA:  Right ankle pain and swelling laterally for 2 months. No known injury. EXAM: RIGHT ANKLE - COMPLETE 3+ VIEW COMPARISON:  01/08/2013 FINDINGS: Lateral soft tissue swelling. No underlying acute bony abnormality. No  fracture, subluxation or dislocation. IMPRESSION: Lateral soft tissue swelling.  No acute bony abnormality. Electronically Signed   By: Rolm Baptise M.D.   On: 09/09/2015 10:38      Assessment & Plan:   1. Right ankle pain   2. Right ankle swelling   3. Need for prophylactic vaccination and inoculation against influenza     1.  R ankle pain/swelling:  Persistent; ongoing for two months; suggestive of gout.  Normal xray. Normal CBC and ESR.  Very reluctant to prescribe prednisone again and discussed risks of repeat prednisone rx to patient and he expressed understanding; refill of Prednisone provided; then recommend immediately starting Diclofenac 72m bid. Obtain uric acid; if normal and no improvement in upcoming week, refer to ortho.  Declined ordering MRI at this time but may warrant in future.  Will defer decision for MRI to ortho. Will HOLD Allopurinol at this time; patient non-compliant with daily Allopurinol and has also started during an acute gouty flare.  Pt discussed aspiration of R lateral ankle; I do not perform this procedure and would refer to ortho to determine if warranted/needed.  Rx for Oxycodone provided.  I refused rx for oxycontin as requested by patient.  2.  S/p flu vaccine.   Orders Placed This Encounter  Procedures  . DG Ankle Complete Right    Standing Status: Future     Number of Occurrences: 1     Standing Expiration Date: 09/08/2016     Order Specific Question:  Reason for Exam (SYMPTOM  OR DIAGNOSIS REQUIRED)    Answer:  R lateral ankle pain and swelling for two months; no injury    Order Specific Question:  Preferred imaging location?    Answer:  External  . Flu Vaccine QUAD 36+ mos IM  . Comprehensive metabolic panel  . Uric Acid  . POCT CBC  . POCT SEDIMENTATION RATE   Meds ordered this encounter  Medications  . diclofenac (VOLTAREN) 75 MG EC tablet    Sig: Take 1 tablet (75 mg total) by mouth 2 (two) times daily.    Dispense:  30 tablet    Refill:  0  . DISCONTD: oxyCODONE-acetaminophen (PERCOCET/ROXICET) 5-325 MG tablet    Sig: Take 1 tablet by mouth every 6 (six) hours as needed for severe pain.    Dispense:  30 tablet    Refill:  0  . predniSONE (DELTASONE) 20 MG tablet    Sig: Two tablets daily x 3 days then one tablet daily x 3 days    Dispense:  9 tablet    Refill:  0  . oxyCODONE-acetaminophen (PERCOCET/ROXICET) 5-325 MG tablet    Sig: Take 1 tablet by mouth every 6 (six) hours as needed for severe pain.    Dispense:  30 tablet    Refill:  0    No Follow-up on file.    Finnbar Cedillos MElayne Guerin M.D. [facility]GS[address] [place]  [phone]

## 2015-09-09 NOTE — Patient Instructions (Signed)
1.  Recommend Prednisone as prescribed for the next six days. 2.  When you finish prednisone, start Diclofenac/Voltaren. 3.  Use oxycodone as needed for pain. 4.  We will call you with uric acid levels in the upcoming week. 5.  If labs are normal, I will recommend evaluation by orthopedic specialist.

## 2015-09-12 ENCOUNTER — Ambulatory Visit (HOSPITAL_COMMUNITY)
Admission: RE | Admit: 2015-09-12 | Discharge: 2015-09-12 | Disposition: A | Discharge status: Home / Self Care | Payer: BLUE CROSS/BLUE SHIELD | Source: Ambulatory Visit | Attending: Neurological Surgery | Admitting: Neurological Surgery

## 2015-09-12 DIAGNOSIS — Z981 Arthrodesis status: Secondary | ICD-10-CM | POA: Diagnosis not present

## 2015-09-12 DIAGNOSIS — M4722 Other spondylosis with radiculopathy, cervical region: Secondary | ICD-10-CM | POA: Insufficient documentation

## 2015-09-12 DIAGNOSIS — M5412 Radiculopathy, cervical region: Secondary | ICD-10-CM

## 2015-09-12 DIAGNOSIS — M5031 Other cervical disc degeneration,  high cervical region: Secondary | ICD-10-CM | POA: Insufficient documentation

## 2015-09-12 DIAGNOSIS — M4712 Other spondylosis with myelopathy, cervical region: Secondary | ICD-10-CM | POA: Insufficient documentation

## 2015-09-12 MED ORDER — LIDOCAINE HCL (PF) 1 % IJ SOLN
INTRAMUSCULAR | Status: AC
  Administered 2015-09-12: 5 mL via INTRAMUSCULAR
  Filled 2015-09-12: qty 5

## 2015-09-12 MED ORDER — ONDANSETRON HCL 4 MG/2ML IJ SOLN: 4.0000 mg | Freq: Four times a day (QID) | INTRAMUSCULAR | Status: DC | PRN

## 2015-09-12 MED ORDER — DIAZEPAM 5 MG PO TABS
10.0000 mg | ORAL_TABLET | Freq: Once | ORAL | Status: AC
Start: 2015-09-12 — End: 2015-09-12
  Administered 2015-09-12: 10 mg via ORAL
  Filled 2015-09-12: qty 2

## 2015-09-12 MED ORDER — IOHEXOL 300 MG/ML  SOLN
10.0000 mL | Freq: Once | INTRAMUSCULAR | Status: AC | PRN
  Administered 2015-09-12: 6 mL via INTRATHECAL

## 2015-09-12 MED ORDER — DIAZEPAM 5 MG PO TABS
ORAL_TABLET | ORAL | Status: AC
  Administered 2015-09-12: 10 mg via ORAL
  Filled 2015-09-12: qty 2

## 2015-09-12 MED ORDER — HYDROCODONE-ACETAMINOPHEN 5-325 MG PO TABS: 1.0000 | ORAL_TABLET | ORAL | Status: DC | PRN

## 2015-09-12 NOTE — Discharge Instructions (Signed)
Myelography, Care After °These instructions give you information on caring for yourself after your procedure. Your doctor may also give you more specific instructions. Call your doctor if you have any problems or questions after your procedure. °HOME CARE °· Rest the first day. °· When you rest, lie flat, with your head slightly raised (elevated). °· Avoid heavy lifting and activity for 48 hours, or as told by your doctor. °· You may take the bandage (dressing) off one day after the test, or as told by your doctor. °· Take all medicines only as told by your doctor. °· Ask your doctor when it is okay to take a shower or bath. °· Ask your doctor when your test results will be ready and how you can get them. Make sure you follow up and get your results. °· Do not drink alcohol for 24 hours, or as told by your doctor. °· Drink enough fluid to keep your pee (urine) clear or pale yellow. °GET HELP IF:  °· You have a fever. °· You have a headache. °· You feel sick to your stomach (nauseous) or throw up (vomit). °· You have pain or cramping in your belly (abdomen). °GET HELP RIGHT AWAY IF:  °· You have a headache with a stiff neck or fever. °· You have trouble breathing. °· Any of the places where the needles were put in are: °¨ Puffy (swollen) or red. °¨ Sore or hot to the touch. °¨ Draining yellowish-white fluid (pus). °¨ Bleeding. °MAKE SURE YOU: °· Understand these instructions. °· Will watch your condition. °· Will get help right away if you are not doing well or get worse. °  °This information is not intended to replace advice given to you by your health care provider. Make sure you discuss any questions you have with your health care provider. °  °Document Released: 05/07/2008 Document Revised: 08/19/2014 Document Reviewed: 04/22/2012 °Elsevier Interactive Patient Education ©2016 [employer] Inc[employer] ° °

## 2015-09-12 NOTE — Procedures (Signed)
Jeremiah Webb is a 55 year old individual who's had significant neck shoulder and arm pain with weakness in his hands. Has had previous laminotomy and foraminotomies at the C7-T1 level. Is had previous cervical fusion from C4-C6 secondary to a traumatic injury. This was done posteriorly using Songer cables. MRI demonstrates significant artifactual changes and the C7-T1 level is not adequately visualized. Some myelogram and post milligrams CAT scan is been advised in an effort to image this area better.  Pre op Dx: Cervical spondylosis with myelopathy and radiculopathy Post op Dx: Cervical spondylosis with myelopathy and radiculopathy C7-T1 Procedure: Cervical myelogram Surgeon: Maurissa Ambrose Puncture level: L2-3 Fluid color: Clear colorless Injection: Iohexol 300. 6 mL Findings: A high-grade stenosis at the C7-T1 level. Further evaluation with CT scanning.

## 2015-09-16 ENCOUNTER — Encounter: Payer: Self-pay | Admitting: *Deleted

## 2015-09-16 ENCOUNTER — Other Ambulatory Visit: Payer: Self-pay | Admitting: Physician Assistant

## 2015-09-16 NOTE — Progress Notes (Signed)
Took a phone call at the request on Jeremiah Webb as patient was upset that no one has called him about his labs. Advised that his liver enzymes were mildly elevated but work up was largely unrevealing, and we would need to recheck his liver enzymes.  He wanted to know why his liver enzymes were elevated, and advised that it could be a number of things and it would be wise to recheck.  Patient this asked about his uric acid.  Advised it was negative.  Patient then wanted to know what the upper limit for uric acid was.  Advised that this was beyond the scope of this conversation. Advised I would send results via mail.  Advised that he return to clinic with these types of questions or speak with Dr. Katrinka Blazing directly.  Patient verbalized understanding. Deliah Boston, MS, PA-C 3:52 PM, 09/16/2015

## 2015-09-22 ENCOUNTER — Encounter: Payer: Self-pay | Admitting: Sports Medicine

## 2015-09-22 ENCOUNTER — Ambulatory Visit (INDEPENDENT_AMBULATORY_CARE_PROVIDER_SITE_OTHER): Payer: BLUE CROSS/BLUE SHIELD | Admitting: Sports Medicine

## 2015-09-22 VITALS — BP 121/83 | HR 86 | Resp 18 | Wt 236.0 lb

## 2015-09-22 DIAGNOSIS — M119 Crystal arthropathy, unspecified: Secondary | ICD-10-CM

## 2015-09-22 DIAGNOSIS — M25471 Effusion, right ankle: Secondary | ICD-10-CM | POA: Diagnosis not present

## 2015-09-22 DIAGNOSIS — M6580 Other synovitis and tenosynovitis, unspecified site: Secondary | ICD-10-CM

## 2015-09-22 MED ORDER — ALLOPURINOL 300 MG PO TABS: 300.0000 mg | ORAL_TABLET | Freq: Two times a day (BID) | ORAL | Status: DC

## 2015-09-22 NOTE — Assessment & Plan Note (Addendum)
Arthrocentesis with crystal analysis, injection. Continue allopurinol, uric acid levels have been as high as 10, most recently 7.6. Goal is less than 5. Double allopurinol to 300 mg twice a day. Ultimately the fluid did not appear cloudy enough to represent gout however the crystal analysis will tell us for sure, he does also have degenerative changes in his ankle joint on x-ray, my suspicion is that most of his pain is from osteoarthritis.  Both uric acid crystals and calcium pyrophosphate crystals seen in arthrocentesis. This confirms dual diagnoses of gout and pseudogout. Ultimately may need colchicine daily in addition to his allopurinol.

## 2015-09-22 NOTE — Progress Notes (Signed)
   Subjective:    I'm seeing this patient as a consultation for:  Dr. Lesle Chris, Dr. Nilda Simmer  CC: Right ankle swelling  HPI: This is a pleasant 55 year old male with a several week history of increasing pain and swelling in his right ankle, he was given a prednisone taper that improved his symptoms somewhat, uric acid levels in the past have been in the tendons, more recently 7.6 in the chart, and he tells me down to the sixes on allopurinol 300 daily. He does have a diagnosis of gout, but is never had an arthrocentesis crystal analysis. Pain is predominantly over the lateral ankle, with swelling, no constitutional symptoms. He is referred to me for further evaluation and definitive treatment.  Past medical history, Surgical history, Family history not pertinant except as noted below, Social history, Allergies, and medications have been entered into the medical record, reviewed, and no changes needed.   Review of Systems: No headache, visual changes, nausea, vomiting, diarrhea, constipation, dizziness, abdominal pain, skin rash, fevers, chills, night sweats, weight loss, swollen lymph nodes, body aches, joint swelling, muscle aches, chest pain, shortness of breath, mood changes, visual or auditory hallucinations.   Objective:   General: Well Developed, well nourished, and in no acute distress.  Neuro/Psych: Alert and oriented x3, extra-ocular muscles intact, able to move all 4 extremities, sensation grossly intact. Skin: Warm and dry, no rashes noted.  Respiratory: Not using accessory muscles, speaking in full sentences, trachea midline.  Cardiovascular: Pulses palpable, no extremity edema. Abdomen: Does not appear distended. Right Ankle: Visibly swollen with tenderness around the lateral talocrural joint Range of motion is full in all directions. Strength is 5/5 in all directions. Stable lateral and medial ligaments; squeeze test and kleiger test unremarkable; Talar dome  nontender; No pain at base of 5th MT; No tenderness over cuboid; No tenderness over N spot or navicular prominence No tenderness on posterior aspects of lateral and medial malleolus No sign of peroneal tendon subluxations; Negative tarsal tunnel tinel's Able to walk 4 steps.  Procedure: Real-time Ultrasound Guided aspiration/Injection of right ankle Device: GE Logiq E  Verbal informed consent obtained.  Time-out conducted.  Noted no overlying erythema, induration, or other signs of local infection.  Skin prepped in a sterile fashion.  Local anesthesia: Topical Ethyl chloride.  With sterile technique and under real time ultrasound guidance:  Aspirated 5 mL of slightly cloudy, straw-colored fluid, syringe switched and 1 mL kenalog 40, 1 mL lidocaine, 1 mL Marcaine injected easily, fluid be sent off for crystal analysis and cell counts. Completed without difficulty  Pain immediately resolved suggesting accurate placement of the medication.  Advised to call if fevers/chills, erythema, induration, drainage, or persistent bleeding.  Images permanently stored and available for review in the ultrasound unit.  Impression: Technically successful ultrasound guided injection.  The ankle was then strapped with compressive dressing.  X-rays personally reviewed and do show anterior talocrural degenerative spurring  Impression and Recommendations:   This case required medical decision making of moderate complexity.

## 2015-09-23 LAB — SYNOVIAL CELL COUNT + DIFF, W/ CRYSTALS
Eosinophils-Synovial: 0 % (ref 0–1)
Lymphocytes-Synovial Fld: 5 % (ref 0–20)
Monocyte/Macrophage: 16 % — ABNORMAL LOW (ref 50–90)
Neutrophil, Synovial: 79 % — ABNORMAL HIGH (ref 0–25)
WBC, Synovial: 1745 cu mm — ABNORMAL HIGH (ref 0–200)

## 2015-10-06 ENCOUNTER — Ambulatory Visit: Payer: Self-pay | Admitting: Sports Medicine

## 2015-10-20 ENCOUNTER — Ambulatory Visit: Payer: Self-pay | Admitting: Sports Medicine

## 2015-12-19 ENCOUNTER — Telehealth: Payer: Self-pay

## 2015-12-19 ENCOUNTER — Ambulatory Visit (INDEPENDENT_AMBULATORY_CARE_PROVIDER_SITE_OTHER): Payer: BLUE CROSS/BLUE SHIELD | Admitting: Sports Medicine

## 2015-12-19 ENCOUNTER — Encounter: Payer: Self-pay | Admitting: Sports Medicine

## 2015-12-19 VITALS — BP 118/78 | HR 79 | Resp 16 | Wt 242.7 lb

## 2015-12-19 DIAGNOSIS — M119 Crystal arthropathy, unspecified: Secondary | ICD-10-CM | POA: Diagnosis not present

## 2015-12-19 DIAGNOSIS — M7051 Other bursitis of knee, right knee: Secondary | ICD-10-CM | POA: Diagnosis not present

## 2015-12-19 DIAGNOSIS — M6580 Other synovitis and tenosynovitis, unspecified site: Secondary | ICD-10-CM

## 2015-12-19 MED ORDER — NAPROXEN 500 MG PO TABS: 500.0000 mg | ORAL_TABLET | Freq: Two times a day (BID) | ORAL | Status: DC

## 2015-12-19 NOTE — Assessment & Plan Note (Signed)
Deep infrapatellar bursitis of the right knee, aspiration with crystal analysis, injection. Return in one month.

## 2015-12-19 NOTE — Telephone Encounter (Signed)
Prescription strength naproxen called in, this is normal and he needs to ice it for 20 minutes 3-4 times per day.

## 2015-12-19 NOTE — Telephone Encounter (Signed)
Pt left VM stating his whole knee is throbbing now after injection and would like to know if this is normal and if he can have something for discomfort. Please advise.

## 2015-12-19 NOTE — Telephone Encounter (Signed)
Pt.notified

## 2015-12-19 NOTE — Assessment & Plan Note (Signed)
Allopurinol was intolerable, rechecking uric acid levels, we may consider Uloric or Zurampic next. Continue colchicine once daily for now. I do suspect that the deep infrapatellar bursitis is gout related.

## 2015-12-19 NOTE — Progress Notes (Signed)
   Subjective:    I'm seeing this patient as a consultation for:  Dr. Lesle Chris  CC: right knee pain  HPI: For 2 weeks this pleasant 55 year old male has had pain that he localizes just deep to his patellar tendon on somewhat of the lateral aspect, moderate, persistent, worse with squatting, and going up and down stairs. No trauma. No gelling.  Past medical history, Surgical history, Family history not pertinant except as noted below, Social history, Allergies, and medications have been entered into the medical record, reviewed, and no changes needed.   Review of Systems: No headache, visual changes, nausea, vomiting, diarrhea, constipation, dizziness, abdominal pain, skin rash, fevers, chills, night sweats, weight loss, swollen lymph nodes, body aches, joint swelling, muscle aches, chest pain, shortness of breath, mood changes, visual or auditory hallucinations.   Objective:   General: Well Developed, well nourished, and in no acute distress.  Neuro/Psych: Alert and oriented x3, extra-ocular muscles intact, able to move all 4 extremities, sensation grossly intact. Skin: Warm and dry, no rashes noted.  Respiratory: Not using accessory muscles, speaking in full sentences, trachea midline.  Cardiovascular: Pulses palpable, no extremity edema. Abdomen: Does not appear distended. Right Knee: Minimal visible fullness over the distal patellar tendon, tender to palpation over the distal lateral aspect of the patellar tendon over what feels to be the deep infrapatellar bursa. ROM normal in flexion and extension and lower leg rotation. Ligaments with solid consistent endpoints including ACL, PCL, LCL, MCL. Negative Mcmurray's and provocative meniscal tests. Non painful patellar compression. Patellar and quadriceps tendons unremarkable. Hamstring and quadriceps strength is normal.  Procedure: Real-time Ultrasound Guided aspiration/Injection of right deep infrapatellar bursa Device: GE Logiq E    Verbal informed consent obtained.  Time-out conducted.  Noted no overlying erythema, induration, or other signs of local infection.  Skin prepped in a sterile fashion.  Local anesthesia: Topical Ethyl chloride.  With sterile technique and under real time ultrasound guidance:  There was a visibly distended deep infrapatellar bursa.  22-gauge needle advanced into the bursa, aspirated scant amount of cloudy fluid, syringe switched and 1 mL lidocaine, 1 mL kenalog 40 injected easily into the deep infrapatellar bursa taking care to avoid injection into the distal patellar tendon. Completed without difficulty  Pain immediately resolved suggesting accurate placement of the medication.  Advised to call if fevers/chills, erythema, induration, drainage, or persistent bleeding.  Images permanently stored and available for review in the ultrasound unit.  Impression: Technically successful ultrasound guided injection.  Impression and Recommendations:   This case required medical decision making of moderate complexity.

## 2015-12-20 LAB — SYNOVIAL CELL COUNT + DIFF, W/ CRYSTALS
Basophils, %: 0 %
Eosinophils-Synovial: 77 % — ABNORMAL HIGH (ref 0–2)
Lymphocytes-Synovial Fld: 3 % (ref 0–74)
Monocyte/Macrophage: 13 % (ref 0–69)
Neutrophil, Synovial: 7 % (ref 0–24)
Synoviocytes, %: 0 % (ref 0–15)
WBC, Synovial: 1275 {cells}/uL — ABNORMAL HIGH (ref <150)

## 2015-12-26 ENCOUNTER — Ambulatory Visit: Payer: Self-pay | Admitting: Sports Medicine

## 2016-01-04 ENCOUNTER — Ambulatory Visit (INDEPENDENT_AMBULATORY_CARE_PROVIDER_SITE_OTHER): Payer: BLUE CROSS/BLUE SHIELD | Admitting: Sports Medicine

## 2016-01-04 ENCOUNTER — Ambulatory Visit (INDEPENDENT_AMBULATORY_CARE_PROVIDER_SITE_OTHER): Payer: BLUE CROSS/BLUE SHIELD

## 2016-01-04 VITALS — BP 120/81 | HR 62 | Resp 18 | Wt 240.5 lb

## 2016-01-04 DIAGNOSIS — M119 Crystal arthropathy, unspecified: Secondary | ICD-10-CM | POA: Diagnosis not present

## 2016-01-04 DIAGNOSIS — M7051 Other bursitis of knee, right knee: Secondary | ICD-10-CM | POA: Diagnosis not present

## 2016-01-04 DIAGNOSIS — M25562 Pain in left knee: Secondary | ICD-10-CM | POA: Diagnosis not present

## 2016-01-04 DIAGNOSIS — M179 Osteoarthritis of knee, unspecified: Secondary | ICD-10-CM | POA: Diagnosis not present

## 2016-01-04 DIAGNOSIS — M25561 Pain in right knee: Secondary | ICD-10-CM

## 2016-01-04 DIAGNOSIS — M6580 Other synovitis and tenosynovitis, unspecified site: Principal | ICD-10-CM

## 2016-01-04 MED ORDER — FEBUXOSTAT 40 MG PO TABS: 40.0000 mg | ORAL_TABLET | Freq: Every day | ORAL | Status: DC

## 2016-01-04 NOTE — Assessment & Plan Note (Signed)
Ordering in the MRI.

## 2016-01-04 NOTE — Assessment & Plan Note (Signed)
Ankle aspiration and injection of the past revealed both calcium pyrophosphate and uric acid crystals confirming dual diagnoses of gout and pseudogout, he recently presented with a deep infrapatellar bursitis which was injected, and resolved completely. He is not taking colchicine, and he is intolerant of allopurinol with erectile dysfunction. We are going to try Uloric, he does want to check his uric acid levels first, if they're over 5 we will need to treat.

## 2016-01-04 NOTE — Progress Notes (Signed)
  Subjective:    CC: Follow-up  HPI: Gout and pseudogout: Confirmed on aspiration of the ankle, had intolerable erectile dysfunction on allopurinol and not taking his colchicine. Most recent uric acid levels were still in the sevens. He tells me he's been modifying his diet would like to recheck uric acid levels before considering Uloric.  Bilateral knee pain: Diagnosed with a deep infrapatellar bursitis in the right knee at the last visit, injected and pain improved significant we, still has some pain that he localizes in both joint lines, moderate, persistent without radiation, he has had greater than 6 weeks of physician directed rehabilitation, injection, NSAIDs without improvement. He is desiring MRI.  Past medical history, Surgical history, Family history not pertinant except as noted below, Social history, Allergies, and medications have been entered into the medical record, reviewed, and no changes needed.   Review of Systems: No fevers, chills, night sweats, weight loss, chest pain, or shortness of breath.   Objective:    General: Well Developed, well nourished, and in no acute distress.  Neuro: Alert and oriented x3, extra-ocular muscles intact, sensation grossly intact.  HEENT: Normocephalic, atraumatic, pupils equal round reactive to light, neck supple, no masses, no lymphadenopathy, thyroid nonpalpable.  Skin: Warm and dry, no rashes. Cardiac: Regular rate and rhythm, no murmurs rubs or gallops, no lower extremity edema.  Respiratory: Clear to auscultation bilaterally. Not using accessory muscles, speaking in full sentences. Bilateral Knee: Normal to inspection with no erythema or effusion or obvious bony abnormalities. Tender to palpation at the joint line's ROM normal in flexion and extension and lower leg rotation. Ligaments with solid consistent endpoints including ACL, PCL, LCL, MCL. Negative Mcmurray's and provocative meniscal tests. Non painful patellar  compression. Patellar and quadriceps tendons unremarkable. Hamstring and quadriceps strength is normal  Impression and Recommendations:    I spent 25 minutes with this patient, greater than 50% was face-to-face time counseling regarding the above diagnoses

## 2016-01-05 LAB — URIC ACID: Uric Acid, Serum: 8.8 mg/dL — ABNORMAL HIGH (ref 4.0–7.8)

## 2016-01-09 ENCOUNTER — Ambulatory Visit (HOSPITAL_COMMUNITY)
Admission: RE | Admit: 2016-01-09 | Discharge: 2016-01-09 | Disposition: A | Discharge status: Home / Self Care | Payer: BLUE CROSS/BLUE SHIELD | Source: Ambulatory Visit | Attending: Cardiology | Admitting: Cardiology

## 2016-01-09 VITALS — BP 136/96 | HR 61 | Wt 242.5 lb

## 2016-01-09 DIAGNOSIS — G4733 Obstructive sleep apnea (adult) (pediatric): Secondary | ICD-10-CM | POA: Diagnosis not present

## 2016-01-09 DIAGNOSIS — I493 Ventricular premature depolarization: Secondary | ICD-10-CM | POA: Diagnosis not present

## 2016-01-09 DIAGNOSIS — Z9189 Other specified personal risk factors, not elsewhere classified: Secondary | ICD-10-CM

## 2016-01-09 DIAGNOSIS — R002 Palpitations: Secondary | ICD-10-CM | POA: Diagnosis not present

## 2016-01-09 DIAGNOSIS — Z79899 Other long term (current) drug therapy: Secondary | ICD-10-CM | POA: Diagnosis not present

## 2016-01-09 DIAGNOSIS — Z8249 Family history of ischemic heart disease and other diseases of the circulatory system: Secondary | ICD-10-CM | POA: Diagnosis not present

## 2016-01-09 DIAGNOSIS — E039 Hypothyroidism, unspecified: Secondary | ICD-10-CM | POA: Insufficient documentation

## 2016-01-09 DIAGNOSIS — Z7982 Long term (current) use of aspirin: Secondary | ICD-10-CM | POA: Diagnosis not present

## 2016-01-09 LAB — LIPID PANEL
CHOLESTEROL: 154 mg/dL (ref 0–200)
HDL: 31 mg/dL — ABNORMAL LOW (ref >40)
LDL Cholesterol: 100 mg/dL — ABNORMAL HIGH (ref 0–99)
Total CHOL/HDL Ratio: 5 RATIO
Triglycerides: 117 mg/dL (ref <150)
VLDL: 23 mg/dL (ref 0–40)

## 2016-01-09 LAB — TSH: TSH: 0.442 u[IU]/mL (ref 0.350–4.500)

## 2016-01-09 MED ORDER — ASPIRIN EC 81 MG PO TBEC: 81.0000 mg | DELAYED_RELEASE_TABLET | Freq: Every day | ORAL | Status: DC

## 2016-01-09 NOTE — Progress Notes (Signed)
Patient ID: Jeremiah Webb, male   DOB: 01-Apr-1961, 55 y.o.   MRN: [id number] PCP: Dr. Benjamin Stain  55 y.o. with history of PVCs returns for cardiology followup.  Patient has a long history of PVCs.  He had a workup back in 2008 for frequent PVCs.  At that time, he had a stress echo which was normal.  He has symptoms on and off.  When he is symptomatic, he feels skipped beats and palpitations.  He has been doing well until recently when his mother sick and eventually died.  He had a week of increased PVCs during that time. Only rare palpitations now.  He has no exertional dyspnea or chest pain.  He is active and lifts weights, walks, and jogs.  Good exercise tolerance. No lightheadedness or syncope.    Echo done in 8/16 showed EF 55-60% with increased echogenicity of septum.  Given PVCs and abnormal-appearing septum, cardiac MRI was done in 9/16.  This showed EF 66%, no LGE, mild noncompaction towards the apex.   ECG: NSR, normal  Labs (7/16): K 4, creatinine 1.42, TSH 31, free T4 low, LDL 96 Labs (1/17): K 4.8, creatinine 1.39  PMH: 1. Hypothyroidism 2. OSA: Not using CPAP 3. C-spine arthritis 4. H/o PVCs: Stress echo in 7/08 was normal.  Holter (6/30) with very rare PVCs. Echo (8/16) with EF 55-60%, increased echogenicity in septum.  Cardiac MRI was done with abnormal septum and PVCs, showing EF 66%, no LGE, mild noncompaction towards the apex.   SH: Airline pilot, nonsmoker, 2 kids, lives in [place].  FH: Mother with CAD, MI around 81.   ROS: All systems reviewed and negative except as per HPI.   Current Outpatient Prescriptions  Medication Sig Dispense Refill  . levothyroxine (SYNTHROID, LEVOTHROID) 150 MCG tablet Take 1 tablet (150 mcg total) by mouth daily. 90 tablet 2  . naproxen (NAPROSYN) 500 MG tablet Take 1 tablet (500 mg total) by mouth 2 (two) times daily with a meal. (Patient taking differently: Take 500 mg by mouth 2 (two) times daily with a meal. prn) 60 tablet 3  .  aspirin EC 81 MG tablet Take 1 tablet (81 mg total) by mouth daily. 90 tablet 3  . febuxostat (ULORIC) 40 MG tablet Take 1 tablet (40 mg total) by mouth daily. (Patient not taking: Reported on 01/09/2016) 90 tablet 3   No current facility-administered medications for this encounter.   BP 136/96 mmHg  Pulse 61  Wt 242 lb 8 oz (109.997 kg)  SpO2 97% General: NAD Neck: No JVD, no thyromegaly or thyroid nodule.  Lungs: Clear to auscultation bilaterally with normal respiratory effort. CV: Nondisplaced PMI.  Heart regular S1/S2, no S3/S4, no murmur.  No peripheral edema.  No carotid bruit.  Normal pedal pulses.  Abdomen: Soft, nontender, no hepatosplenomegaly, no distention.  Skin: Intact without lesions or rashes.  Neurologic: Alert and oriented x 3.  Psych: Normal affect. Extremities: No clubbing or cyanosis.  HEENT: Normal.   Assessment/Plan: 1. PVCs: Wax and wane, currently minimal.  Echo in 8/16 showed normal EF but increased echogenicity of septum.  Cardiac MRI was done, showing EF 66%, no late gadolinium enhancement, and possible mild noncompaction towards the apex.  With normal EF, not sure what to make of the possible noncompaction.  Will follow for now.  - Given mild noncompaction, will get echo at 4 yrs in 9/18.  2. Cardiovascular risk assessment: Patient has family history of premature CAD (mother with MI around 56).  He  does not have exertional symptoms.  No diabetes, HTN, or known hyperlipidemia.  He never smoked.   - Continue ASA 81 daily.  - I will arrange for a coronary calcium score scan to help risk stratify.   Marca Ancona 01/09/2016

## 2016-01-09 NOTE — Patient Instructions (Signed)
Start Aspirin 81 mg daily  Labs today  Cardiac CT Scan  We will contact you in 1 year to schedule your next appointment.

## 2016-01-11 ENCOUNTER — Ambulatory Visit: Admission: RE | Admit: 2016-01-11 | Payer: BLUE CROSS/BLUE SHIELD | Source: Ambulatory Visit

## 2016-01-15 ENCOUNTER — Ambulatory Visit (INDEPENDENT_AMBULATORY_CARE_PROVIDER_SITE_OTHER): Payer: BLUE CROSS/BLUE SHIELD

## 2016-01-15 DIAGNOSIS — M899 Disorder of bone, unspecified: Secondary | ICD-10-CM | POA: Diagnosis not present

## 2016-01-15 DIAGNOSIS — M25461 Effusion, right knee: Secondary | ICD-10-CM

## 2016-01-15 DIAGNOSIS — M25462 Effusion, left knee: Secondary | ICD-10-CM | POA: Diagnosis not present

## 2016-01-15 DIAGNOSIS — M7051 Other bursitis of knee, right knee: Secondary | ICD-10-CM

## 2016-01-15 DIAGNOSIS — M1712 Unilateral primary osteoarthritis, left knee: Secondary | ICD-10-CM | POA: Diagnosis not present

## 2016-01-15 DIAGNOSIS — R600 Localized edema: Secondary | ICD-10-CM | POA: Diagnosis not present

## 2016-01-15 DIAGNOSIS — M179 Osteoarthritis of knee, unspecified: Secondary | ICD-10-CM | POA: Diagnosis not present

## 2016-01-16 ENCOUNTER — Ambulatory Visit: Payer: Self-pay | Admitting: Sports Medicine

## 2016-01-18 ENCOUNTER — Ambulatory Visit (INDEPENDENT_AMBULATORY_CARE_PROVIDER_SITE_OTHER): Payer: BLUE CROSS/BLUE SHIELD | Admitting: Sports Medicine

## 2016-01-18 ENCOUNTER — Encounter: Payer: Self-pay | Admitting: Sports Medicine

## 2016-01-18 DIAGNOSIS — M6580 Other synovitis and tenosynovitis, unspecified site: Principal | ICD-10-CM

## 2016-01-18 DIAGNOSIS — M119 Crystal arthropathy, unspecified: Secondary | ICD-10-CM

## 2016-01-18 NOTE — Progress Notes (Signed)
  Subjective:    CC: Follow-up  HPI: Gout: Has still not taking Uloric, would like to try additional month of dietary modifications to get his uric acid levels below 6.  Bilateral knee pain: Pain-free today, did have bilateral MRIs both of which showed standard degenerative changes and meniscal fraying.  Past medical history, Surgical history, Family history not pertinant except as noted below, Social history, Allergies, and medications have been entered into the medical record, reviewed, and no changes needed.   Review of Systems: No fevers, chills, night sweats, weight loss, chest pain, or shortness of breath.   Objective:    General: Well Developed, well nourished, and in no acute distress.  Neuro: Alert and oriented x3, extra-ocular muscles intact, sensation grossly intact.  HEENT: Normocephalic, atraumatic, pupils equal round reactive to light, neck supple, no masses, no lymphadenopathy, thyroid nonpalpable.  Skin: Warm and dry, no rashes. Cardiac: Regular rate and rhythm, no murmurs rubs or gallops, no lower extremity edema.  Respiratory: Clear to auscultation bilaterally. Not using accessory muscles, speaking in full sentences.  Impression and Recommendations:    I spent 25 minutes with this patient, greater than 50% was face-to-face time counseling regarding the above diagnoses

## 2016-01-18 NOTE — Assessment & Plan Note (Signed)
Still has not yet started Uloric, we will try an additional month to get the uric acid levels down below 6, ideally below 5. If unable to get it below 6, he has shaken my hand and told me he would start the Uloric. MRI showed various expected degrees of degenerative change along the cartilage and the menisci.

## 2016-01-23 ENCOUNTER — Ambulatory Visit (INDEPENDENT_AMBULATORY_CARE_PROVIDER_SITE_OTHER)
Admission: RE | Admit: 2016-01-23 | Discharge: 2016-01-23 | Disposition: A | Discharge status: Home / Self Care | Payer: BLUE CROSS/BLUE SHIELD | Source: Ambulatory Visit | Attending: Cardiology | Admitting: Cardiology

## 2016-01-23 DIAGNOSIS — Z9189 Other specified personal risk factors, not elsewhere classified: Secondary | ICD-10-CM

## 2016-01-26 ENCOUNTER — Telehealth (HOSPITAL_COMMUNITY): Payer: Self-pay | Admitting: *Deleted

## 2016-01-26 NOTE — Telephone Encounter (Signed)
Notes Recorded by Laurey Morale, MD on 01/24/2016 at 9:53 AM Coronary calcium score is 0, excellent and suggests low cardiac risk. The aortic root is mildly dilated, suggest echo in 1 year to follow. Report to patient.  Attempted to call pt back and Left message to call back

## 2016-01-26 NOTE — Telephone Encounter (Signed)
Pt called requesting calcium scoring CT results. Message sent to [facility] to call pt and advise

## 2016-01-30 NOTE — Telephone Encounter (Signed)
Patient made aware of results and plan of care.

## 2016-02-01 ENCOUNTER — Ambulatory Visit: Payer: Self-pay | Admitting: Sports Medicine

## 2016-02-08 DIAGNOSIS — M5412 Radiculopathy, cervical region: Secondary | ICD-10-CM | POA: Diagnosis not present

## 2016-02-15 ENCOUNTER — Ambulatory Visit: Payer: Self-pay | Admitting: Sports Medicine

## 2016-02-28 DIAGNOSIS — M79642 Pain in left hand: Secondary | ICD-10-CM | POA: Diagnosis not present

## 2016-02-28 DIAGNOSIS — R52 Pain, unspecified: Secondary | ICD-10-CM | POA: Diagnosis not present

## 2016-02-28 DIAGNOSIS — M79641 Pain in right hand: Secondary | ICD-10-CM | POA: Diagnosis not present

## 2016-03-20 DIAGNOSIS — G5612 Other lesions of median nerve, left upper limb: Secondary | ICD-10-CM | POA: Diagnosis not present

## 2016-03-20 DIAGNOSIS — M5412 Radiculopathy, cervical region: Secondary | ICD-10-CM | POA: Diagnosis not present

## 2016-03-20 DIAGNOSIS — R52 Pain, unspecified: Secondary | ICD-10-CM | POA: Diagnosis not present

## 2016-04-22 ENCOUNTER — Telehealth: Payer: Self-pay

## 2016-04-22 NOTE — Telephone Encounter (Signed)
Yes he has, he does have a diagnosis of primary osteoarthritis, I'm going to send a message to [facility] to get this approved for both knees.

## 2016-04-22 NOTE — Telephone Encounter (Signed)
Submitted for approval on Orthovisc. Awaiting confirmation.  

## 2016-04-22 NOTE — Telephone Encounter (Signed)
Pt states he's having some outer knee pain only when he runs outside.  Would like to know if he's a candidate for Ortho Visc and if you think this will help with his pain.  Would like to go ahead and get the process going if he is.

## 2016-04-22 NOTE — Telephone Encounter (Signed)
Multiple locations, but likely at the joint lines and under the kneecap.

## 2016-04-22 NOTE — Telephone Encounter (Signed)
Pt would like to know what part of the right knee would he be experiencing pain from looking at his MRI. Please advise.

## 2016-04-23 MED ORDER — SODIUM HYALURONATE (VISCOSUP) 20 MG/2ML IX SOSY: 1.0000 | PREFILLED_SYRINGE | INTRA_ARTICULAR | 0 refills | Status: DC

## 2016-04-23 NOTE — Telephone Encounter (Signed)
Received the following information from OV benefits investigation:  Patient has PPO plan with an effective date of 08/13/15. Orthovisc / Monovisc is not the preferred drug through [facility] and will not be covered until patient has tried and failed with the preferred drug. Pre-cert would be required after trying and failing with preferred, pre-cert phone # is [phone].. [id number] is covered at 100% [id number] is covered at 100% of contracted rate when performed in office setting. $20.00 copay applies, if billed. Ref# [id number]   Will route to Provider for Euflexxa Rx to [facility]. Patient notified of Rx changed and will be expecting phone call from pharmacy.

## 2016-04-23 NOTE — Addendum Note (Signed)
Addended by: Monica Becton on: 04/23/2016 03:30 PM   Modules accepted: Orders

## 2016-04-24 DIAGNOSIS — S60552A Superficial foreign body of left hand, initial encounter: Secondary | ICD-10-CM | POA: Diagnosis not present

## 2016-04-29 ENCOUNTER — Ambulatory Visit (INDEPENDENT_AMBULATORY_CARE_PROVIDER_SITE_OTHER): Payer: BLUE CROSS/BLUE SHIELD | Admitting: Emergency Medicine

## 2016-04-29 VITALS — BP 118/80 | HR 65 | Temp 98.2°F | Resp 16 | Ht 74.0 in | Wt 239.0 lb

## 2016-04-29 DIAGNOSIS — Z114 Encounter for screening for human immunodeficiency virus [HIV]: Secondary | ICD-10-CM

## 2016-04-29 DIAGNOSIS — Z1159 Encounter for screening for other viral diseases: Secondary | ICD-10-CM | POA: Diagnosis not present

## 2016-04-29 DIAGNOSIS — Z0000 Encounter for general adult medical examination without abnormal findings: Secondary | ICD-10-CM | POA: Diagnosis not present

## 2016-04-29 DIAGNOSIS — R5383 Other fatigue: Secondary | ICD-10-CM

## 2016-04-29 DIAGNOSIS — M1009 Idiopathic gout, multiple sites: Secondary | ICD-10-CM

## 2016-04-29 DIAGNOSIS — Z23 Encounter for immunization: Secondary | ICD-10-CM | POA: Diagnosis not present

## 2016-04-29 LAB — COMPLETE METABOLIC PANEL WITH GFR
ALT: 44 U/L (ref 9–46)
AST: 28 U/L (ref 10–35)
Albumin: 4.2 g/dL (ref 3.6–5.1)
Alkaline Phosphatase: 51 U/L (ref 40–115)
BILIRUBIN TOTAL: 0.6 mg/dL (ref 0.2–1.2)
BUN: 17 mg/dL (ref 7–25)
CO2: 25 mmol/L (ref 20–31)
CREATININE: 1.22 mg/dL (ref 0.70–1.33)
Calcium: 9.6 mg/dL (ref 8.6–10.3)
Chloride: 107 mmol/L (ref 98–110)
GFR, Est African American: 78 mL/min (ref >=60)
GFR, Est Non African American: 67 mL/min (ref >=60)
Glucose, Bld: 122 mg/dL — ABNORMAL HIGH (ref 65–99)
POTASSIUM: 4.2 mmol/L (ref 3.5–5.3)
SODIUM: 141 mmol/L (ref 135–146)
Total Protein: 7.1 g/dL (ref 6.1–8.1)

## 2016-04-29 LAB — POCT CBC
Granulocyte percent: 60.9 %G (ref 37–80)
HCT, POC: 43.4 % — AB (ref 43.5–53.7)
HEMOGLOBIN: 14.8 g/dL (ref 14.1–18.1)
LYMPH, POC: 1.7 (ref 0.6–3.4)
MCH, POC: 30.1 pg (ref 27–31.2)
MCHC: 34.1 g/dL (ref 31.8–35.4)
MCV: 88.3 fL (ref 80–97)
MID (cbc): 0.5 (ref 0–0.9)
MPV: 6.8 fL (ref 0–99.8)
POC Granulocyte: 3.3 (ref 2–6.9)
POC LYMPH PERCENT: 30.6 %L (ref 10–50)
POC MID %: 8.5 %M (ref 0–12)
Platelet Count, POC: 296 10*3/uL (ref 142–424)
RBC: 4.91 M/uL (ref 4.69–6.13)
RDW, POC: 12.6 %
WBC: 5.4 10*3/uL (ref 4.6–10.2)

## 2016-04-29 LAB — POCT URINALYSIS DIP (MANUAL ENTRY)
Bilirubin, UA: NEGATIVE
Blood, UA: NEGATIVE
Glucose, UA: NEGATIVE
Ketones, POC UA: NEGATIVE
LEUKOCYTES UA: NEGATIVE
NITRITE UA: NEGATIVE
PH UA: 5.5
Protein Ur, POC: NEGATIVE
Spec Grav, UA: 1.015
Urobilinogen, UA: 0.2

## 2016-04-29 LAB — LIPID PANEL
Cholesterol: 162 mg/dL (ref 125–200)
HDL: 36 mg/dL — ABNORMAL LOW (ref >=40)
LDL CALC: 106 mg/dL (ref <130)
TRIGLYCERIDES: 102 mg/dL (ref <150)
Total CHOL/HDL Ratio: 4.5 Ratio (ref <=5.0)
VLDL: 20 mg/dL (ref <30)

## 2016-04-29 LAB — HIV ANTIBODY (ROUTINE TESTING W REFLEX): HIV: NONREACTIVE

## 2016-04-29 LAB — TSH: TSH: 0.76 mIU/L (ref 0.40–4.50)

## 2016-04-29 LAB — URIC ACID: URIC ACID, SERUM: 9.2 mg/dL — AB (ref 4.0–8.0)

## 2016-04-29 LAB — PSA: PSA: 1.2 ng/mL (ref <=4.0)

## 2016-04-29 LAB — HEPATITIS C ANTIBODY: HCV AB: NEGATIVE

## 2016-04-29 MED ORDER — LEVOTHYROXINE SODIUM 150 MCG PO TABS: 150.0000 ug | ORAL_TABLET | Freq: Every day | ORAL | 3 refills | Status: DC

## 2016-04-29 NOTE — Progress Notes (Addendum)
Patient ID: Jeremiah Webb, male   DOB: 08/23/1960, 55 y.o.   MRN: [id number]    By signing my name below, I, Essence Howell, attest that this documentation has been prepared under the direction and in the presence of Darlyne Russian, MD Electronically Signed: Ladene Artist, ED Scribe 04/29/2016 at 9:18 AM.   Chief Complaint:  Chief Complaint  Patient presents with  . Annual Exam    HPI: Jeremiah Webb is a 55 y.o. male who reports to [facility] today for an annual exam.   Right Knee Pain Pt states that he has been followed by his orthopedist, Aundria Mems, MD, for right knee pain. He states that he runs a lot and typically experiences knee pain for a few days after running. His MRI in June showed small tears in his meniscus which he plans to treat with Euflexxa injection before trying surgery.   Pt also wants to discuss testosterone replacement therapy at this visit. States he is concerned about his testerone level and wants to have it checked.   Past Medical History:  Diagnosis Date  . Hypothyroidism   . Sleep apnea    no cpap  . Thyroid disease    Hypothyroid   Past Surgical History:  Procedure Laterality Date  . CERVICAL DISC SURGERY     #5  . POSTERIOR CERVICAL LAMINECTOMY WITH MET- RX Left 09/20/2014   Procedure: Left C7-T1 "Sitting" Microdiskectomy;  Surgeon: Kristeen Miss, MD;  Location: [facility];  Service: Neurosurgery;  Laterality: Left;  Left C7-T1 "Sitting" Microdiskectomy  . QUADRICEPS TENDON REPAIR    . SHOULDER SURGERY     Social History   Social History  . Marital status: Single    Spouse name: N/A  . Number of children: N/A  . Years of education: N/A   Social History Main Topics  . Smoking status: Never Smoker  . Smokeless tobacco: Never Used  . Alcohol use 0.0 oz/week     Comment: occasional  . Drug use: No  . Sexual activity: Not Asked   Other Topics Concern  . None   Social History Narrative  . None   Family History  Problem Relation Age  of Onset  . Heart disease Mother   . Stroke Mother   . Diabetes Father   . Colon cancer Maternal Grandfather   . Esophageal cancer Neg Hx   . Rectal cancer Neg Hx   . Stomach cancer Neg Hx    Allergies  Allergen Reactions  . Shrimp [Shellfish Allergy]     Only shrimp causes indigestion. No epi-pen necessary.   Prior to Admission medications   Medication Sig Start Date End Date Taking? Authorizing Provider  aspirin EC 81 MG tablet Take 1 tablet (81 mg total) by mouth daily. 01/09/16   Larey Dresser, MD  febuxostat (ULORIC) 40 MG tablet Take 1 tablet (40 mg total) by mouth daily. Patient not taking: Reported on 01/09/2016 01/04/16   Silverio Decamp, MD  levothyroxine (SYNTHROID, LEVOTHROID) 150 MCG tablet Take 1 tablet (150 mcg total) by mouth daily. 09/01/15   Shawnee Knapp, MD  naproxen (NAPROSYN) 500 MG tablet Take 1 tablet (500 mg total) by mouth 2 (two) times daily with a meal. Patient taking differently: Take 500 mg by mouth 2 (two) times daily with a meal. prn 12/19/15   Silverio Decamp, MD  Sodium Hyaluronate (EUFLEXXA) 20 MG/2ML SOSY Inject 1 each into the articular space once a week. x3 weeks.  Dx bilateral  Knee osteoarthritis, patient will bring syringes to me and I will inject 04/23/16   Silverio Decamp, MD    ROS: The patient denies fevers, chills, night sweats, unintentional weight loss, chest pain, palpitations, wheezing, dyspnea on exertion, nausea, vomiting, abdominal pain, dysuria, hematuria, melena, numbness, weakness, or tingling.   All other systems have been reviewed and were otherwise negative with the exception of those mentioned in the HPI and as above.    PHYSICAL EXAM: Vitals:   04/29/16 0913  BP: 118/80  Pulse: 65  Resp: 16  Temp: 98.2 F (36.8 C)   Body mass index is 30.69 kg/m.  General: Alert, no acute distress HEENT:  Normocephalic, atraumatic, oropharynx patent. Old facial acne scaring  Eye: EOMI, [facility] Cardiovascular:  Regular  rate and rhythm, no rubs murmurs or gallops.  No Carotid bruits, radial pulse intact. No pedal edema.  Respiratory: Clear to auscultation bilaterally.  No wheezes, rales, or rhonchi.  No cyanosis, no use of accessory musculature Abdominal: No organomegaly, abdomen is soft and non-tender, positive bowel sounds.  No masses. Musculoskeletal: Gait intact. No edema, tenderness GU: Prostate is symmetrically enlarged  Skin: No rashes. Neurologic: Facial musculature symmetric. Psychiatric: Patient acts appropriately throughout our interaction. Lymphatic: No cervical or submandibular lymphadenopathy   LABS: Results for orders placed or performed in visit on 04/29/16  POCT CBC  Result Value Ref Range   WBC 5.4 4.6 - 10.2 K/uL   Lymph, poc 1.7 0.6 - 3.4   POC LYMPH PERCENT 30.6 10 - 50 %L   MID (cbc) 0.5 0 - 0.9   POC MID % 8.5 0 - 12 %M   POC Granulocyte 3.3 2 - 6.9   Granulocyte percent 60.9 37 - 80 %G   RBC 4.91 4.69 - 6.13 M/uL   Hemoglobin 14.8 14.1 - 18.1 g/dL   HCT, POC 43.4 (A) 43.5 - 53.7 %   MCV 88.3 80 - 97 fL   MCH, POC 30.1 27 - 31.2 pg   MCHC 34.1 31.8 - 35.4 g/dL   RDW, POC 12.6 %   Platelet Count, POC 296 142 - 424 K/uL   MPV 6.8 0 - 99.8 fL  POCT urinalysis dipstick  Result Value Ref Range   Color, UA yellow yellow   Clarity, UA clear clear   Glucose, UA negative negative   Bilirubin, UA negative negative   Ketones, POC UA negative negative   Spec Grav, UA 1.015    Blood, UA negative negative   pH, UA 5.5    Protein Ur, POC negative negative   Urobilinogen, UA 0.2    Nitrite, UA Negative Negative   Leukocytes, UA Negative Negative    EKG/XRAY:   Primary read interpreted by Dr. Everlene Farrier at [facility].   ASSESSMENT/PLAN:    Gross sideeffects, risk and benefits, and alternatives of medications d/w patient. Patient is aware that all medications have potential sideeffects and we are unable to predict every sideeffect or drug-drug interaction that may occur.  Arlyss Queen MD 04/29/2016 9:18 AM

## 2016-04-29 NOTE — Patient Instructions (Addendum)
I will call you with your blood test results. Continue exercise     IF you received an x-ray today, you will receive an invoice from [facility]. Please contact [facility] at [phone] with questions or concerns regarding your invoice.   IF you received labwork today, you will receive an invoice from [employer]. Please contact [facility] at [phone] with questions or concerns regarding your invoice.   Our billing staff will not be able to assist you with questions regarding bills from these companies.  You will be contacted with the lab results as soon as they are available. The fastest way to get your results is to activate your My Chart account. Instructions are located on the last page of this paperwork. If you have not heard from [place] regarding the results in 2 weeks, please contact this office.

## 2016-05-01 LAB — TESTOS,TOTAL,FREE AND SHBG (FEMALE)
Sex Hormone Binding Glob.: 20 nmol/L (ref 10–50)
Testosterone, Free: 69 pg/mL (ref 35.0–155.0)
Testosterone,Total,LC/MS/MS: 327 ng/dL (ref 250–1100)

## 2016-05-23 DIAGNOSIS — M1812 Unilateral primary osteoarthritis of first carpometacarpal joint, left hand: Secondary | ICD-10-CM | POA: Diagnosis not present

## 2016-05-23 NOTE — Telephone Encounter (Signed)
Called speciality pharmacy [phone]), spoke with [place], to check status of Euflexxa, Rx is still processing. Once they finish processing they will contact the Pt then notify office of shipping information. Pt advised of update.

## 2016-05-28 DIAGNOSIS — M170 Bilateral primary osteoarthritis of knee: Secondary | ICD-10-CM | POA: Diagnosis not present

## 2016-05-28 DIAGNOSIS — G5622 Lesion of ulnar nerve, left upper limb: Secondary | ICD-10-CM | POA: Diagnosis not present

## 2016-05-29 DIAGNOSIS — G5622 Lesion of ulnar nerve, left upper limb: Secondary | ICD-10-CM | POA: Diagnosis not present

## 2016-05-30 ENCOUNTER — Ambulatory Visit (INDEPENDENT_AMBULATORY_CARE_PROVIDER_SITE_OTHER): Payer: BLUE CROSS/BLUE SHIELD | Admitting: Sports Medicine

## 2016-05-30 ENCOUNTER — Encounter: Payer: Self-pay | Admitting: Sports Medicine

## 2016-05-30 DIAGNOSIS — M5090 Cervical disc disorder, unspecified, unspecified cervical region: Secondary | ICD-10-CM | POA: Diagnosis not present

## 2016-05-30 DIAGNOSIS — M119 Crystal arthropathy, unspecified: Secondary | ICD-10-CM | POA: Diagnosis not present

## 2016-05-30 DIAGNOSIS — M6580 Other synovitis and tenosynovitis, unspecified site: Secondary | ICD-10-CM | POA: Diagnosis not present

## 2016-05-30 DIAGNOSIS — Z6830 Body mass index (BMI) 30.0-30.9, adult: Secondary | ICD-10-CM | POA: Diagnosis not present

## 2016-05-30 NOTE — Progress Notes (Signed)
  Procedure: Real-time Ultrasound Guided Injection of left knee Device: GE Logiq E  Verbal informed consent obtained.  Time-out conducted.  Noted no overlying erythema, induration, or other signs of local infection.  Skin prepped in a sterile fashion.  Local anesthesia: Topical Ethyl chloride.  With sterile technique and under real time ultrasound guidance:  Euflexxa injected easily Completed without difficulty  Pain immediately resolved suggesting accurate placement of the medication.  Advised to call if fevers/chills, erythema, induration, drainage, or persistent bleeding.  Images permanently stored and available for review in the ultrasound unit.  Impression: Technically successful ultrasound guided injection.  Procedure: Real-time Ultrasound Guided Injection of right knee Device: GE Logiq E  Verbal informed consent obtained.  Time-out conducted.  Noted no overlying erythema, induration, or other signs of local infection.  Skin prepped in a sterile fashion.  Local anesthesia: Topical Ethyl chloride.  With sterile technique and under real time ultrasound guidance:  Euflexxa injected easily Completed without difficulty  Pain immediately resolved suggesting accurate placement of the medication.  Advised to call if fevers/chills, erythema, induration, drainage, or persistent bleeding.  Images permanently stored and available for review in the ultrasound unit.  Impression: Technically successful ultrasound guided injection.

## 2016-05-30 NOTE — Assessment & Plan Note (Signed)
Euflexxa injection #1 of 3 into both knees, return in one week for #2 of 3. Patient is still resistant to starting Uloric. Most recent uric acid levels were 9.

## 2016-05-30 NOTE — Telephone Encounter (Signed)
Injections were delivered.

## 2016-06-06 ENCOUNTER — Ambulatory Visit (INDEPENDENT_AMBULATORY_CARE_PROVIDER_SITE_OTHER): Payer: BLUE CROSS/BLUE SHIELD | Admitting: Sports Medicine

## 2016-06-06 ENCOUNTER — Encounter: Payer: Self-pay | Admitting: Sports Medicine

## 2016-06-06 DIAGNOSIS — M6580 Other synovitis and tenosynovitis, unspecified site: Secondary | ICD-10-CM

## 2016-06-06 DIAGNOSIS — M119 Crystal arthropathy, unspecified: Secondary | ICD-10-CM | POA: Diagnosis not present

## 2016-06-06 DIAGNOSIS — E291 Testicular hypofunction: Secondary | ICD-10-CM

## 2016-06-06 NOTE — Progress Notes (Signed)
  Procedure: Real-time Ultrasound Guided Injection of left knee Device: GE Logiq E  Verbal informed consent obtained.  Time-out conducted.  Noted no overlying erythema, induration, or other signs of local infection.  Skin prepped in a sterile fashion.  Local anesthesia: Topical Ethyl chloride.  With sterile technique and under real time ultrasound guidance:  Euflexxa injected easily Completed without difficulty  Pain immediately resolved suggesting accurate placement of the medication.  Advised to call if fevers/chills, erythema, induration, drainage, or persistent bleeding.  Images permanently stored and available for review in the ultrasound unit.  Impression: Technically successful ultrasound guided injection.  Procedure: Real-time Ultrasound Guided Injection of right knee Device: GE Logiq E  Verbal informed consent obtained.  Time-out conducted.  Noted no overlying erythema, induration, or other signs of local infection.  Skin prepped in a sterile fashion.  Local anesthesia: Topical Ethyl chloride.  With sterile technique and under real time ultrasound guidance:  Euflexxa injected easily Completed without difficulty  Pain immediately resolved suggesting accurate placement of the medication.  Advised to call if fevers/chills, erythema, induration, drainage, or persistent bleeding.  Images permanently stored and available for review in the ultrasound unit.  Impression: Technically successful ultrasound guided injection. 

## 2016-06-06 NOTE — Assessment & Plan Note (Signed)
We will recheck testosterone levels at the next visit, his most recent levels were in the 300s. I did advise that he needs to follow up with his PCP for some of this.

## 2016-06-06 NOTE — Assessment & Plan Note (Signed)
Euflexxa injection #2 of 3 into both knees. Return in one week for injection #3 of 3. Patient continues to be resistant to starting Uloric, most recent uric acid levels were 9.

## 2016-06-12 ENCOUNTER — Ambulatory Visit (INDEPENDENT_AMBULATORY_CARE_PROVIDER_SITE_OTHER): Payer: BLUE CROSS/BLUE SHIELD | Admitting: Sports Medicine

## 2016-06-12 DIAGNOSIS — E291 Testicular hypofunction: Secondary | ICD-10-CM

## 2016-06-12 DIAGNOSIS — M6580 Other synovitis and tenosynovitis, unspecified site: Secondary | ICD-10-CM | POA: Diagnosis not present

## 2016-06-12 DIAGNOSIS — M119 Crystal arthropathy, unspecified: Secondary | ICD-10-CM

## 2016-06-12 NOTE — Assessment & Plan Note (Signed)
Rechecking testosterone levels. 

## 2016-06-12 NOTE — Progress Notes (Signed)
  Procedure: Real-time Ultrasound Guided Injection of left knee Device: GE Logiq E  Verbal informed consent obtained.  Time-out conducted.  Noted no overlying erythema, induration, or other signs of local infection.  Skin prepped in a sterile fashion.  Local anesthesia: Topical Ethyl chloride.  With sterile technique and under real time ultrasound guidance:  Euflexxa injected easily Completed without difficulty  Pain immediately resolved suggesting accurate placement of the medication.  Advised to call if fevers/chills, erythema, induration, drainage, or persistent bleeding.  Images permanently stored and available for review in the ultrasound unit.  Impression: Technically successful ultrasound guided injection.  Procedure: Real-time Ultrasound Guided Injection of right knee Device: GE Logiq E  Verbal informed consent obtained.  Time-out conducted.  Noted no overlying erythema, induration, or other signs of local infection.  Skin prepped in a sterile fashion.  Local anesthesia: Topical Ethyl chloride.  With sterile technique and under real time ultrasound guidance:  Euflexxa injected easily Completed without difficulty  Pain immediately resolved suggesting accurate placement of the medication.  Advised to call if fevers/chills, erythema, induration, drainage, or persistent bleeding.  Images permanently stored and available for review in the ultrasound unit.  Impression: Technically successful ultrasound guided injection. 

## 2016-06-12 NOTE — Assessment & Plan Note (Signed)
Euflexxa injection #3 of 3 into both knees.  Starting to feel a bit better. Rechecking uric acid levels, we will start Uloric if levels are high.

## 2016-06-13 ENCOUNTER — Ambulatory Visit: Payer: Self-pay | Admitting: Sports Medicine

## 2016-06-13 LAB — URIC ACID: Uric Acid, Serum: 9.5 mg/dL — ABNORMAL HIGH (ref 4.0–8.0)

## 2016-06-13 LAB — TESTOSTERONE TOTAL,FREE,BIO, MALES
Albumin: 4 g/dL (ref 3.6–5.1)
Sex Hormone Binding: 22 nmol/L (ref 10–50)
Testosterone: 206 ng/dL — ABNORMAL LOW (ref 250–827)

## 2016-06-14 ENCOUNTER — Encounter: Payer: Self-pay | Admitting: Sports Medicine

## 2016-06-14 ENCOUNTER — Ambulatory Visit (INDEPENDENT_AMBULATORY_CARE_PROVIDER_SITE_OTHER): Payer: BLUE CROSS/BLUE SHIELD | Admitting: Sports Medicine

## 2016-06-14 DIAGNOSIS — M119 Crystal arthropathy, unspecified: Secondary | ICD-10-CM

## 2016-06-14 DIAGNOSIS — M6580 Other synovitis and tenosynovitis, unspecified site: Secondary | ICD-10-CM | POA: Diagnosis not present

## 2016-06-14 DIAGNOSIS — E291 Testicular hypofunction: Secondary | ICD-10-CM

## 2016-06-14 MED ORDER — TESTOSTERONE CYPIONATE 200 MG/ML IM SOLN: 200.0000 mg | INTRAMUSCULAR | 0 refills | Status: DC

## 2016-06-14 MED ORDER — TESTOSTERONE CYPIONATE 200 MG/ML IM SOLN
200.0000 mg | Freq: Once | INTRAMUSCULAR | Status: AC
  Administered 2016-06-14: 200 mg via INTRAMUSCULAR

## 2016-06-14 NOTE — Assessment & Plan Note (Signed)
Patient has finally agreed to start Uloric. Check uric acid levels in 3 weeks.

## 2016-06-14 NOTE — Assessment & Plan Note (Addendum)
Most recent testosterone level was low. Starting testosterone injections, recheck testosterone, PSA, CBC 1 week after second shot.  Increase testosterone to 300 mg every 2 weeks, recheck 1 week after third injection

## 2016-06-14 NOTE — Progress Notes (Signed)
  Subjective:    CC: Follow-up  HPI: Gout: Uric acid levels have risen to greater than 9. Agreeable to use Uloric.  Hypogonadism: Would like to start testosterone supplementation. Risks, benefits discussed.  Past medical history:  Negative.  See flowsheet/record as well for more information.  Surgical history: Negative.  See flowsheet/record as well for more information.  Family history: Negative.  See flowsheet/record as well for more information.  Social history: Negative.  See flowsheet/record as well for more information.  Allergies, and medications have been entered into the medical record, reviewed, and no changes needed.   Review of Systems: No fevers, chills, night sweats, weight loss, chest pain, or shortness of breath.   Objective:    General: Well Developed, well nourished, and in no acute distress.  Neuro: Alert and oriented x3, extra-ocular muscles intact, sensation grossly intact.  HEENT: Normocephalic, atraumatic, pupils equal round reactive to light, neck supple, no masses, no lymphadenopathy, thyroid nonpalpable.  Skin: Warm and dry, no rashes. Cardiac: Regular rate and rhythm, no murmurs rubs or gallops, no lower extremity edema.  Respiratory: Clear to auscultation bilaterally. Not using accessory muscles, speaking in full sentences.  Impression and Recommendations:    Male hypogonadism Most recent testosterone level was low. Starting testosterone injections, recheck testosterone, PSA, CBC 1 week after second shot.  Crystalline arthropathy with gout and pseudogout Patient has finally agreed to start Uloric. Check uric acid levels in 3 weeks.  I spent 25 minutes with this patient, greater than 50% was face-to-face time counseling regarding the above diagnoses

## 2016-06-14 NOTE — Patient Instructions (Signed)
Get blood work checked in exactly 3 weeks, follow-up with me in 4 weeks

## 2016-06-16 ENCOUNTER — Other Ambulatory Visit: Payer: Self-pay | Admitting: Family Medicine

## 2016-06-18 ENCOUNTER — Other Ambulatory Visit: Payer: Self-pay | Admitting: Family Medicine

## 2016-06-18 ENCOUNTER — Telehealth: Payer: Self-pay | Admitting: *Deleted

## 2016-06-18 NOTE — Telephone Encounter (Signed)
Form faxed to t [facility]

## 2016-06-18 NOTE — Telephone Encounter (Signed)
Androgens form being faxed to the office

## 2016-06-18 NOTE — Telephone Encounter (Signed)
Rx for Levothroxine went to [facility] by mistake.  Pt wants at [address]  Rx electronically changed.

## 2016-06-24 ENCOUNTER — Encounter: Payer: Self-pay | Admitting: Sports Medicine

## 2016-06-24 ENCOUNTER — Ambulatory Visit (INDEPENDENT_AMBULATORY_CARE_PROVIDER_SITE_OTHER): Payer: BLUE CROSS/BLUE SHIELD | Admitting: Sports Medicine

## 2016-06-24 ENCOUNTER — Ambulatory Visit (INDEPENDENT_AMBULATORY_CARE_PROVIDER_SITE_OTHER): Payer: BLUE CROSS/BLUE SHIELD

## 2016-06-24 DIAGNOSIS — M19011 Primary osteoarthritis, right shoulder: Secondary | ICD-10-CM

## 2016-06-24 DIAGNOSIS — G8929 Other chronic pain: Secondary | ICD-10-CM

## 2016-06-24 DIAGNOSIS — M25511 Pain in right shoulder: Secondary | ICD-10-CM

## 2016-06-24 NOTE — Assessment & Plan Note (Signed)
History of left labral repair, onset of symptoms on the right side, similar in nature. Per patient he does desire to go straight to MRI and surgery. Ordering x-ray, MR arthrogram, depending on results we will then refer him to shoulder surgery.

## 2016-06-24 NOTE — Progress Notes (Signed)
   Subjective:    I'm seeing this patient as a consultation for:  Dr. Nilda Simmer  CC: Right shoulder pain  HPI: This is a pleasant 55 year old male with a history of gout, he also has a history of a left shoulder arthroscopy with a labral debridement. Having a worsening of pain in the right shoulder, localized at the joint line and worse with pushing forward. At this point he does not desire to proceed with injections or therapy, simply would like an MRI and surgery. Pain is moderate, persistent with minimal mechanical symptoms.  Past medical history:  Negative.  See flowsheet/record as well for more information.  Surgical history: Negative.  See flowsheet/record as well for more information.  Family history: Negative.  See flowsheet/record as well for more information.  Social history: Negative.  See flowsheet/record as well for more information.  Allergies, and medications have been entered into the medical record, reviewed, and no changes needed.   Review of Systems: No headache, visual changes, nausea, vomiting, diarrhea, constipation, dizziness, abdominal pain, skin rash, fevers, chills, night sweats, weight loss, swollen lymph nodes, body aches, joint swelling, muscle aches, chest pain, shortness of breath, mood changes, visual or auditory hallucinations.   Objective:   General: Well Developed, well nourished, and in no acute distress.  Neuro/Psych: Alert and oriented x3, extra-ocular muscles intact, able to move all 4 extremities, sensation grossly intact. Skin: Warm and dry, no rashes noted.  Respiratory: Not using accessory muscles, speaking in full sentences, trachea midline.  Cardiovascular: Pulses palpable, no extremity edema. Abdomen: Does not appear distended. Right Shoulder: Inspection reveals no abnormalities, atrophy or asymmetry. Palpation is normal with no tenderness over AC joint or bicipital groove. ROM is full in all planes. Rotator cuff strength normal  throughout. Only mildly positive Neer and Hawkin's tests, empty can. Speeds and Yergason's tests normal. Strongly positive Obrien's, negative crank, negative clunk, and good stability. Normal scapular function observed. No painful arc and no drop arm sign. No apprehension sign  Impression and Recommendations:   This case required medical decision making of moderate complexity.  Right shoulder pain History of left labral repair, onset of symptoms on the right side, similar in nature. Per patient he does desire to go straight to MRI and surgery. Ordering x-ray, MR arthrogram, depending on results we will then refer him to shoulder surgery.

## 2016-06-26 DIAGNOSIS — M5090 Cervical disc disorder, unspecified, unspecified cervical region: Secondary | ICD-10-CM | POA: Diagnosis not present

## 2016-06-26 DIAGNOSIS — M542 Cervicalgia: Secondary | ICD-10-CM | POA: Diagnosis not present

## 2016-06-28 DIAGNOSIS — M25562 Pain in left knee: Secondary | ICD-10-CM | POA: Diagnosis not present

## 2016-07-01 ENCOUNTER — Encounter: Payer: Self-pay | Admitting: Sports Medicine

## 2016-07-01 ENCOUNTER — Ambulatory Visit (INDEPENDENT_AMBULATORY_CARE_PROVIDER_SITE_OTHER): Payer: BLUE CROSS/BLUE SHIELD | Admitting: Sports Medicine

## 2016-07-01 ENCOUNTER — Ambulatory Visit (INDEPENDENT_AMBULATORY_CARE_PROVIDER_SITE_OTHER): Payer: BLUE CROSS/BLUE SHIELD

## 2016-07-01 DIAGNOSIS — M25511 Pain in right shoulder: Secondary | ICD-10-CM | POA: Diagnosis not present

## 2016-07-01 DIAGNOSIS — S46811A Strain of other muscles, fascia and tendons at shoulder and upper arm level, right arm, initial encounter: Secondary | ICD-10-CM | POA: Diagnosis not present

## 2016-07-01 DIAGNOSIS — X58XXXA Exposure to other specified factors, initial encounter: Secondary | ICD-10-CM | POA: Diagnosis not present

## 2016-07-01 DIAGNOSIS — S43491A Other sprain of right shoulder joint, initial encounter: Secondary | ICD-10-CM | POA: Diagnosis not present

## 2016-07-01 DIAGNOSIS — M7581 Other shoulder lesions, right shoulder: Secondary | ICD-10-CM | POA: Diagnosis not present

## 2016-07-01 DIAGNOSIS — M19011 Primary osteoarthritis, right shoulder: Secondary | ICD-10-CM | POA: Diagnosis not present

## 2016-07-01 DIAGNOSIS — G8929 Other chronic pain: Secondary | ICD-10-CM

## 2016-07-01 DIAGNOSIS — M67411 Ganglion, right shoulder: Secondary | ICD-10-CM

## 2016-07-01 NOTE — Progress Notes (Signed)
  Procedure: Real-time Ultrasound Guided gadolinium contrast injection of right glenohumeral joint Device: GE Logiq E  Verbal informed consent obtained.  Time-out conducted.  Noted no overlying erythema, induration, or other signs of local infection.  Skin prepped in a sterile fashion.  Local anesthesia: Topical Ethyl chloride.  With sterile technique and under real time ultrasound guidance:  Using a 22-gauge spinal needle advanced into the joint and injected 1 mL kenalog 40, 2 mL lidocaine, 2 mL Marcaine, syringe switched and 0.1 mL gadolinium injected, syringe again switched and 10 mL sterile saline injected into the joint. Joint visualized and capsule seen distending confirming intra-articular placement of contrast material and medication. Completed without difficulty  Advised to call if fevers/chills, erythema, induration, drainage, or persistent bleeding.  Images permanently stored and available for review in the ultrasound unit.  Impression: Technically successful ultrasound guided gadolinium contrast injection for MR arthrography.  Please see separate MR arthrogram report.

## 2016-07-01 NOTE — Assessment & Plan Note (Signed)
History of left labral repair, now having onset of symptoms on the right side, similar in nature.  Patient desired to go straight MRI and surgery. Injection for MR arthrogram as above. Treatment will depend on results.

## 2016-07-02 DIAGNOSIS — M5090 Cervical disc disorder, unspecified, unspecified cervical region: Secondary | ICD-10-CM | POA: Diagnosis not present

## 2016-07-02 DIAGNOSIS — M542 Cervicalgia: Secondary | ICD-10-CM | POA: Diagnosis not present

## 2016-07-02 NOTE — Addendum Note (Signed)
Addended by: Monica Becton on: 07/02/2016 10:50 AM   Modules accepted: Orders

## 2016-07-03 ENCOUNTER — Ambulatory Visit: Payer: Self-pay | Admitting: Sports Medicine

## 2016-07-08 ENCOUNTER — Ambulatory Visit: Payer: Self-pay | Admitting: Sports Medicine

## 2016-07-08 DIAGNOSIS — M119 Crystal arthropathy, unspecified: Secondary | ICD-10-CM | POA: Diagnosis not present

## 2016-07-08 DIAGNOSIS — M6580 Other synovitis and tenosynovitis, unspecified site: Secondary | ICD-10-CM | POA: Diagnosis not present

## 2016-07-08 DIAGNOSIS — E291 Testicular hypofunction: Secondary | ICD-10-CM | POA: Diagnosis not present

## 2016-07-08 LAB — CBC
HCT: 45.1 % (ref 38.5–50.0)
Hemoglobin: 14.8 g/dL (ref 13.2–17.1)
MCH: 30 pg (ref 27.0–33.0)
MCHC: 32.8 g/dL (ref 32.0–36.0)
MCV: 91.3 fL (ref 80.0–100.0)
MPV: 8.8 fL (ref 7.5–12.5)
Platelets: 367 10*3/uL (ref 140–400)
RBC: 4.94 MIL/uL (ref 4.20–5.80)
RDW: 13.7 % (ref 11.0–15.0)
WBC: 9.7 10*3/uL (ref 3.8–10.8)

## 2016-07-09 DIAGNOSIS — M542 Cervicalgia: Secondary | ICD-10-CM | POA: Diagnosis not present

## 2016-07-09 DIAGNOSIS — M5090 Cervical disc disorder, unspecified, unspecified cervical region: Secondary | ICD-10-CM | POA: Diagnosis not present

## 2016-07-09 DIAGNOSIS — M75111 Incomplete rotator cuff tear or rupture of right shoulder, not specified as traumatic: Secondary | ICD-10-CM | POA: Diagnosis not present

## 2016-07-09 LAB — TESTOSTERONE TOTAL,FREE,BIO, MALES
Albumin: 3.8 g/dL (ref 3.6–5.1)
Sex Hormone Binding: 14 nmol/L (ref 10–50)
Testosterone: 208 ng/dL — ABNORMAL LOW (ref 250–827)

## 2016-07-09 LAB — PSA, TOTAL AND FREE
PSA, % Free: 20 % — ABNORMAL LOW (ref >25)
PSA, Free: 0.3 ng/mL
PSA, Total: 1.5 ng/mL (ref <=4.0)

## 2016-07-09 LAB — URIC ACID: Uric Acid, Serum: 8.7 mg/dL — ABNORMAL HIGH (ref 4.0–8.0)

## 2016-07-10 ENCOUNTER — Ambulatory Visit: Payer: Self-pay | Admitting: Sports Medicine

## 2016-07-10 MED ORDER — TESTOSTERONE CYPIONATE 200 MG/ML IM SOLN: 300.0000 mg | INTRAMUSCULAR | 0 refills | Status: DC

## 2016-07-10 NOTE — Addendum Note (Signed)
Addended by: Monica Becton on: 07/10/2016 01:23 PM   Modules accepted: Orders

## 2016-07-11 ENCOUNTER — Encounter: Payer: Self-pay | Admitting: Sports Medicine

## 2016-07-11 ENCOUNTER — Ambulatory Visit (INDEPENDENT_AMBULATORY_CARE_PROVIDER_SITE_OTHER): Payer: BLUE CROSS/BLUE SHIELD | Admitting: Sports Medicine

## 2016-07-11 DIAGNOSIS — M542 Cervicalgia: Secondary | ICD-10-CM | POA: Diagnosis not present

## 2016-07-11 DIAGNOSIS — M25511 Pain in right shoulder: Secondary | ICD-10-CM

## 2016-07-11 DIAGNOSIS — E291 Testicular hypofunction: Secondary | ICD-10-CM | POA: Diagnosis not present

## 2016-07-11 DIAGNOSIS — M5090 Cervical disc disorder, unspecified, unspecified cervical region: Secondary | ICD-10-CM | POA: Diagnosis not present

## 2016-07-11 DIAGNOSIS — M119 Crystal arthropathy, unspecified: Secondary | ICD-10-CM | POA: Diagnosis not present

## 2016-07-11 DIAGNOSIS — G8929 Other chronic pain: Secondary | ICD-10-CM

## 2016-07-11 DIAGNOSIS — M6580 Other synovitis and tenosynovitis, unspecified site: Secondary | ICD-10-CM | POA: Diagnosis not present

## 2016-07-11 MED ORDER — TESTOSTERONE CYPIONATE 200 MG/ML IM SOLN: 300.0000 mg | INTRAMUSCULAR | 0 refills | Status: DC

## 2016-07-11 NOTE — Assessment & Plan Note (Signed)
Testosterone levels are still low, increasing testosterone to 300 mg, he will do one injection today, another one in 2 weeks, and recheck 1 week after that

## 2016-07-11 NOTE — Progress Notes (Signed)
  Subjective:    CC: Follow-up  HPI: This is a pleasant 55 year old male, here to recheck his shoulder. Shoulder still painful and MRI showed multiple rotator cuff tears. He has already had a visit with oral surgery and shoulder arthroscopy as planned.  Gout: Uric acid levels have only come down a bit with diet and exercise, he is still resistant to starting Uloric.  Male hypogonadism: Testosterone 200 mg has only brought his levels up two points.  Past medical history:  Negative.  See flowsheet/record as well for more information.  Surgical history: Negative.  See flowsheet/record as well for more information.  Family history: Negative.  See flowsheet/record as well for more information.  Social history: Negative.  See flowsheet/record as well for more information.  Allergies, and medications have been entered into the medical record, reviewed, and no changes needed.   Review of Systems: No fevers, chills, night sweats, weight loss, chest pain, or shortness of breath.   Objective:    General: Well Developed, well nourished, and in no acute distress.  Neuro: Alert and oriented x3, extra-ocular muscles intact, sensation grossly intact.  HEENT: Normocephalic, atraumatic, pupils equal round reactive to light, neck supple, no masses, no lymphadenopathy, thyroid nonpalpable.  Skin: Warm and dry, no rashes. Cardiac: Regular rate and rhythm, no murmurs rubs or gallops, no lower extremity edema.  Respiratory: Clear to auscultation bilaterally. Not using accessory muscles, speaking in full sentences.  Impression and Recommendations:    Crystalline arthropathy with gout and pseudogout Uric acid level has improved slightly but not enough, he is still resistant to starting Uloric and would like to continue doing diet and exercise. We can recheck his uric acid levels in a month or 2.  Male hypogonadism Testosterone levels are still low, increasing testosterone to 300 mg, he will do one  injection today, another one in 2 weeks, and recheck 1 week after that  Right shoulder pain Extensive rotator cuff tearing and glenohumeral osteoarthritis, Dr. Thurston Hole is going to operate. He would like a second opinion from Dr. Ave Filter.

## 2016-07-11 NOTE — Assessment & Plan Note (Signed)
Uric acid level has improved slightly but not enough, he is still resistant to starting Uloric and would like to continue doing diet and exercise. We can recheck his uric acid levels in a month or 2.

## 2016-07-11 NOTE — Assessment & Plan Note (Signed)
Extensive rotator cuff tearing and glenohumeral osteoarthritis, Dr. Thurston Hole is going to operate. He would like a second opinion from Dr. Ave Filter.

## 2016-07-12 ENCOUNTER — Encounter: Payer: Self-pay | Admitting: Sports Medicine

## 2016-07-12 ENCOUNTER — Telehealth: Payer: Self-pay

## 2016-07-12 ENCOUNTER — Ambulatory Visit (INDEPENDENT_AMBULATORY_CARE_PROVIDER_SITE_OTHER): Payer: BLUE CROSS/BLUE SHIELD | Admitting: Emergency Medicine

## 2016-07-12 VITALS — BP 120/70 | HR 92 | Temp 98.7°F | Resp 16 | Ht 74.0 in | Wt 244.0 lb

## 2016-07-12 DIAGNOSIS — Z23 Encounter for immunization: Secondary | ICD-10-CM

## 2016-07-12 MED ORDER — HYDROCODONE-ACETAMINOPHEN 5-325 MG PO TABS: 1.0000 | ORAL_TABLET | Freq: Four times a day (QID) | ORAL | 0 refills | Status: DC | PRN

## 2016-07-12 MED ORDER — PREDNISONE 20 MG PO TABS: ORAL_TABLET | ORAL | 0 refills | Status: DC

## 2016-07-12 NOTE — Telephone Encounter (Signed)
Pt left VM stating his left knee is swollen and painful. Would like to know what to do this time since he went to the orthopaedic office last time. Stated that they only injected his knee and didn't drain it. Please assist.

## 2016-07-12 NOTE — Progress Notes (Addendum)
Patient ID: Jeremiah Webb, male   DOB: [birth year], 55 y.o.   MRN: [id number]    By signing my name below, I, Jeremiah Webb, attest that this documentation has been prepared under the direction and in the presence of Jeremiah Russian, MD Electronically Signed: Ladene Webb, ED Scribe 07/12/2016 at 5:19 PM.  Chief Complaint:  Chief Complaint  Patient presents with  . Joint Swelling  . Knee Pain    left knee  . Immunizations    States he need tetanus   HPI: Jeremiah Webb is a 55 y.o. male who reports to [facility] today complaining of intermittent left knee pain for several months. Pt reports associated joint swelling to the left knee as well. He reports increased pain with jogging on the track. Pt had a MRI of both knees obtained on 01/15/16 that showed degenerative tearing of the superior surface of the midbody and posterior horn on the right. He has been followed by Jeremiah Mems, MD and receiving Euflexxa injections into both knees with US guidance with approximately 2 weeks of relief. His last injection was 06/12/16.  Immunizations  Pt requests a tetanus at this visit.   Past Medical History:  Diagnosis Date  . Hypothyroidism   . Sleep apnea    no cpap  . Thyroid disease    Hypothyroid   Past Surgical History:  Procedure Laterality Date  . CERVICAL DISC SURGERY     #5  . POSTERIOR CERVICAL LAMINECTOMY WITH MET- RX Left 09/20/2014   Procedure: Left C7-T1 "Sitting" Microdiskectomy;  Surgeon: Jeremiah Miss, MD;  Location: [place];  Service: Neurosurgery;  Laterality: Left;  Left C7-T1 "Sitting" Microdiskectomy  . QUADRICEPS TENDON REPAIR    . SHOULDER SURGERY     Social History   Social History  . Marital status: Single    Spouse name: N/A  . Number of children: N/A  . Years of education: N/A   Social History Main Topics  . Smoking status: Never Smoker  . Smokeless tobacco: Never Used  . Alcohol use 0.0 oz/week     Comment: occasional  . Drug use: No  . Sexual  activity: Not on file   Other Topics Concern  . Not on file   Social History Narrative  . No narrative on file   Family History  Problem Relation Age of Onset  . Heart disease Mother   . Stroke Mother   . Diabetes Father   . Colon cancer Maternal Grandfather   . Esophageal cancer Neg Hx   . Rectal cancer Neg Hx   . Stomach cancer Neg Hx    Allergies  Allergen Reactions  . Shrimp [Shellfish Allergy]     Only shrimp causes indigestion. No epi-pen necessary.   Prior to Admission medications   Medication Sig Start Date End Date Taking? Authorizing Provider  levothyroxine (SYNTHROID, LEVOTHROID) 150 MCG tablet TAKE ONE TABLET BY MOUTH ONCE DAILY 06/18/16  Yes Jeremiah Russian, MD  testosterone cypionate (DEPOTESTOSTERONE CYPIONATE) 200 MG/ML injection Inject 1.5 mLs (300 mg total) into the muscle every 14 (fourteen) days. Include syringes, needles 07/11/16  Yes Jeremiah Decamp, MD   ROS: The patient denies fevers, chills, night sweats, unintentional weight loss, chest pain, palpitations, wheezing, dyspnea on exertion, nausea, vomiting, abdominal pain, dysuria, hematuria, melena, numbness, weakness, or tingling. +arthralgias, +joint swelling  All other systems have been reviewed and were otherwise negative with the exception of those mentioned in the HPI and as above.    PHYSICAL  EXAM: Vitals:   07/12/16 1654  BP: 120/70  Pulse: 92  Resp: 16  Temp: 98.7 F (37.1 C)   Body mass index is 31.33 kg/m.  General: Alert, no acute distress HEENT:  Normocephalic, atraumatic, oropharynx patent. Eye: [facility] Cardiovascular:  Regular rate and rhythm, no rubs murmurs or gallops. No Carotid bruits, radial pulse intact. No pedal edema.  Respiratory: Clear to auscultation bilaterally. No wheezes, rales, or rhonchi. No cyanosis, no use of accessory musculature Abdominal: No organomegaly, abdomen is soft and non-tender, positive bowel sounds. No masses. Musculoskeletal: Gait intact.  No edema. Generalized puffiness over the L knee with minimal increased warmth. Pain with flexion and extension.  Skin: Scar over L knee.  Neurologic: Facial musculature symmetric. Psychiatric: Patient acts appropriately throughout our interaction. Lymphatic: No cervical or submandibular lymphadenopathy  LABS:  EKG/XRAY:   Primary read interpreted by [facility] Everlene Farrier at [facility].  ASSESSMENT/PLAN: Patient will be on prednisone 40 mg daily for 5 days. He will follow-up with his regular doctor on Monday to see about aspiration of the knee. He was given a limited number of hydrocodone to take for pain. His drug data sheet was pulled and there are no prescription for pain medication. Follow-up will be with [email or web address] personally performed the services described in this documentation, which was scribed in my presence. The recorded information has been reviewed and is accurate.   Gross sideeffects, risk and benefits, and alternatives of medications d/w patient. Patient is aware that all medications have potential sideeffects and we are unable to predict every sideeffect or drug-drug interaction that may occur.  Arlyss Queen MD 07/12/2016 5:18 PM

## 2016-07-12 NOTE — Telephone Encounter (Signed)
Why don't we drain it and see if there is uric acid or pyrophosphate crystals, we did discuss as long as his uric acid level is going to be high, he will continue to have gout flares.

## 2016-07-12 NOTE — Patient Instructions (Addendum)
Please take the prednisone as prescribed. He had a prescription for hydrocodone for breakthrough pain. See the regular doctor on Monday to have your knee aspirated and checked for crystals. Discuss with him at that time uloric    IF you received an x-ray today, you will receive an invoice from [facility]. Please contact [facility] at [phone] with questions or concerns regarding your invoice.   IF you received labwork today, you will receive an invoice from [employer]. Please contact [facility] at [phone] with questions or concerns regarding your invoice.   Our billing staff will not be able to assist you with questions regarding bills from these companies.  You will be contacted with the lab results as soon as they are available. The fastest way to get your results is to activate your My Chart account. Instructions are located on the last page of this paperwork. If you have not heard from [place] regarding the results in 2 weeks, please contact this office.

## 2016-07-15 ENCOUNTER — Other Ambulatory Visit: Payer: Self-pay

## 2016-07-15 DIAGNOSIS — M119 Crystal arthropathy, unspecified: Secondary | ICD-10-CM

## 2016-07-15 DIAGNOSIS — M6580 Other synovitis and tenosynovitis, unspecified site: Principal | ICD-10-CM

## 2016-07-15 MED ORDER — FEBUXOSTAT 40 MG PO TABS: 40.0000 mg | ORAL_TABLET | Freq: Every day | ORAL | 3 refills | Status: DC

## 2016-07-16 DIAGNOSIS — M542 Cervicalgia: Secondary | ICD-10-CM | POA: Diagnosis not present

## 2016-07-16 DIAGNOSIS — M5090 Cervical disc disorder, unspecified, unspecified cervical region: Secondary | ICD-10-CM | POA: Diagnosis not present

## 2016-07-18 DIAGNOSIS — M542 Cervicalgia: Secondary | ICD-10-CM | POA: Diagnosis not present

## 2016-07-18 DIAGNOSIS — M5090 Cervical disc disorder, unspecified, unspecified cervical region: Secondary | ICD-10-CM | POA: Diagnosis not present

## 2016-07-19 DIAGNOSIS — M75111 Incomplete rotator cuff tear or rupture of right shoulder, not specified as traumatic: Secondary | ICD-10-CM | POA: Diagnosis not present

## 2016-07-22 ENCOUNTER — Telehealth: Payer: Self-pay | Admitting: *Deleted

## 2016-07-22 NOTE — Telephone Encounter (Signed)
PA submitted for Uloric 40 mg tab. Patient has tried allopurinol 300 mg. It caused him to have erectile dysfunction. Key: [id number]

## 2016-07-23 DIAGNOSIS — M542 Cervicalgia: Secondary | ICD-10-CM | POA: Diagnosis not present

## 2016-07-23 DIAGNOSIS — M5090 Cervical disc disorder, unspecified, unspecified cervical region: Secondary | ICD-10-CM | POA: Diagnosis not present

## 2016-07-25 ENCOUNTER — Encounter (HOSPITAL_BASED_OUTPATIENT_CLINIC_OR_DEPARTMENT_OTHER): Payer: Self-pay

## 2016-07-25 ENCOUNTER — Ambulatory Visit (HOSPITAL_BASED_OUTPATIENT_CLINIC_OR_DEPARTMENT_OTHER): Admit: 2016-07-25 | Payer: BLUE CROSS/BLUE SHIELD | Admitting: Orthopedic Surgery

## 2016-07-25 DIAGNOSIS — M542 Cervicalgia: Secondary | ICD-10-CM | POA: Diagnosis not present

## 2016-07-25 DIAGNOSIS — M5090 Cervical disc disorder, unspecified, unspecified cervical region: Secondary | ICD-10-CM | POA: Diagnosis not present

## 2016-07-25 SURGERY — SHOULDER ARTHROSCOPY WITH SUBACROMIAL DECOMPRESSION, ROTATOR CUFF REPAIR AND BICEP TENDON REPAIR
Anesthesia: General | Laterality: Right

## 2016-07-29 DIAGNOSIS — M5090 Cervical disc disorder, unspecified, unspecified cervical region: Secondary | ICD-10-CM | POA: Diagnosis not present

## 2016-07-29 DIAGNOSIS — M542 Cervicalgia: Secondary | ICD-10-CM | POA: Diagnosis not present

## 2016-07-31 NOTE — Telephone Encounter (Signed)
Uloric PA was approved through 08/11/2038. Pharmacy is aware.

## 2016-08-03 DIAGNOSIS — M5412 Radiculopathy, cervical region: Secondary | ICD-10-CM | POA: Diagnosis not present

## 2016-08-16 DIAGNOSIS — E291 Testicular hypofunction: Secondary | ICD-10-CM | POA: Diagnosis not present

## 2016-08-20 LAB — TESTOSTERONE TOTAL,FREE,BIO, MALES
Albumin: 4 g/dL (ref 3.6–5.1)
Sex Hormone Binding: 18 nmol/L (ref 10–50)
Testosterone, Bioavailable: 204.8 ng/dL (ref 110.0–575.0)
Testosterone, Free: 111.4 pg/mL (ref 46.0–224.0)
Testosterone: 494 ng/dL (ref 250–827)

## 2016-08-21 ENCOUNTER — Encounter: Payer: Self-pay | Admitting: Sports Medicine

## 2016-08-21 DIAGNOSIS — E291 Testicular hypofunction: Secondary | ICD-10-CM

## 2016-08-22 NOTE — Assessment & Plan Note (Signed)
Testosterone levels are in the 400s, increasing to 400 mg every 2 weeks, recheck 1 week after the third injection.

## 2016-09-03 DIAGNOSIS — M5412 Radiculopathy, cervical region: Secondary | ICD-10-CM | POA: Diagnosis not present

## 2016-09-10 ENCOUNTER — Encounter: Payer: Self-pay | Admitting: Sports Medicine

## 2016-09-10 ENCOUNTER — Other Ambulatory Visit: Payer: Self-pay

## 2016-09-10 ENCOUNTER — Ambulatory Visit: Payer: Self-pay | Admitting: Sports Medicine

## 2016-09-10 DIAGNOSIS — E291 Testicular hypofunction: Secondary | ICD-10-CM

## 2016-09-13 DIAGNOSIS — E291 Testicular hypofunction: Secondary | ICD-10-CM | POA: Diagnosis not present

## 2016-09-16 LAB — TESTOSTERONE TOTAL,FREE,BIO, MALES
Albumin: 3.4 g/dL — ABNORMAL LOW (ref 3.6–5.1)
Sex Hormone Binding: 14 nmol/L (ref 10–50)
Testosterone, Bioavailable: 572.2 ng/dL (ref 110.0–575.0)
Testosterone, Free: 362.6 pg/mL — ABNORMAL HIGH (ref 46.0–224.0)
Testosterone: 1022 ng/dL — ABNORMAL HIGH (ref 250–827)

## 2016-09-20 ENCOUNTER — Other Ambulatory Visit: Payer: Self-pay | Admitting: Sports Medicine

## 2016-09-20 DIAGNOSIS — E291 Testicular hypofunction: Secondary | ICD-10-CM

## 2016-09-22 ENCOUNTER — Other Ambulatory Visit: Payer: Self-pay | Admitting: Family Medicine

## 2016-09-26 DIAGNOSIS — M5090 Cervical disc disorder, unspecified, unspecified cervical region: Secondary | ICD-10-CM | POA: Diagnosis not present

## 2016-09-27 ENCOUNTER — Telehealth: Payer: Self-pay | Admitting: Family Medicine

## 2016-09-27 ENCOUNTER — Telehealth: Payer: Self-pay | Admitting: *Deleted

## 2016-09-27 NOTE — Telephone Encounter (Signed)
Pt calling wanting a refill on Colchicine he states that he called [place] Monday and I didn't see a message please respond

## 2016-09-27 NOTE — Telephone Encounter (Signed)
Submitted PA through [email or web address]

## 2016-09-28 NOTE — Telephone Encounter (Signed)
09/23/16 refilled   Pt advised.  it was over 100.00 and will not get. I advised to see if they have a cheaper generic available, he said insurance will only pay for brand He thanked me

## 2016-10-01 ENCOUNTER — Other Ambulatory Visit: Payer: Self-pay | Admitting: Neurological Surgery

## 2016-10-01 DIAGNOSIS — M5090 Cervical disc disorder, unspecified, unspecified cervical region: Secondary | ICD-10-CM

## 2016-10-01 NOTE — Telephone Encounter (Signed)
Insurance wants patient to have tried and failed brand Colrys. rx for brand was already sent

## 2016-10-07 ENCOUNTER — Encounter: Payer: Self-pay | Admitting: Sports Medicine

## 2016-10-07 DIAGNOSIS — M5030 Other cervical disc degeneration, unspecified cervical region: Secondary | ICD-10-CM

## 2016-10-08 DIAGNOSIS — H01021 Squamous blepharitis right upper eyelid: Secondary | ICD-10-CM | POA: Diagnosis not present

## 2016-10-08 DIAGNOSIS — H01022 Squamous blepharitis right lower eyelid: Secondary | ICD-10-CM | POA: Diagnosis not present

## 2016-10-08 DIAGNOSIS — H01024 Squamous blepharitis left upper eyelid: Secondary | ICD-10-CM | POA: Diagnosis not present

## 2016-10-08 DIAGNOSIS — H2513 Age-related nuclear cataract, bilateral: Secondary | ICD-10-CM | POA: Diagnosis not present

## 2016-10-10 ENCOUNTER — Inpatient Hospital Stay
Admission: RE | Admit: 2016-10-10 | Discharge: 2016-10-10 | Disposition: A | Discharge status: Home / Self Care | Payer: Self-pay | Source: Ambulatory Visit | Attending: Neurological Surgery | Admitting: Neurological Surgery

## 2016-10-14 ENCOUNTER — Encounter: Payer: Self-pay | Admitting: Sports Medicine

## 2016-10-18 ENCOUNTER — Ambulatory Visit: Payer: Self-pay

## 2016-10-23 ENCOUNTER — Emergency Department (HOSPITAL_COMMUNITY): Payer: BLUE CROSS/BLUE SHIELD

## 2016-10-23 ENCOUNTER — Emergency Department (HOSPITAL_COMMUNITY)
Admission: EM | Admit: 2016-10-23 | Discharge: 2016-10-23 | Disposition: A | Discharge status: Home / Self Care | Payer: BLUE CROSS/BLUE SHIELD | Attending: Emergency Medicine | Admitting: Emergency Medicine

## 2016-10-23 ENCOUNTER — Encounter (HOSPITAL_COMMUNITY): Payer: Self-pay

## 2016-10-23 DIAGNOSIS — R930 Abnormal findings on diagnostic imaging of skull and head, not elsewhere classified: Secondary | ICD-10-CM | POA: Insufficient documentation

## 2016-10-23 DIAGNOSIS — Z79899 Other long term (current) drug therapy: Secondary | ICD-10-CM | POA: Insufficient documentation

## 2016-10-23 DIAGNOSIS — Y999 Unspecified external cause status: Secondary | ICD-10-CM | POA: Insufficient documentation

## 2016-10-23 DIAGNOSIS — T07XXXA Unspecified multiple injuries, initial encounter: Secondary | ICD-10-CM

## 2016-10-23 DIAGNOSIS — R1012 Left upper quadrant pain: Secondary | ICD-10-CM | POA: Diagnosis not present

## 2016-10-23 DIAGNOSIS — Y939 Activity, unspecified: Secondary | ICD-10-CM | POA: Insufficient documentation

## 2016-10-23 DIAGNOSIS — E039 Hypothyroidism, unspecified: Secondary | ICD-10-CM | POA: Diagnosis not present

## 2016-10-23 DIAGNOSIS — R0789 Other chest pain: Secondary | ICD-10-CM | POA: Insufficient documentation

## 2016-10-23 DIAGNOSIS — I10 Essential (primary) hypertension: Secondary | ICD-10-CM | POA: Insufficient documentation

## 2016-10-23 DIAGNOSIS — M542 Cervicalgia: Secondary | ICD-10-CM | POA: Insufficient documentation

## 2016-10-23 DIAGNOSIS — S4992XA Unspecified injury of left shoulder and upper arm, initial encounter: Secondary | ICD-10-CM | POA: Diagnosis present

## 2016-10-23 DIAGNOSIS — Y92410 Unspecified street and highway as the place of occurrence of the external cause: Secondary | ICD-10-CM | POA: Diagnosis not present

## 2016-10-23 DIAGNOSIS — S4991XA Unspecified injury of right shoulder and upper arm, initial encounter: Secondary | ICD-10-CM | POA: Insufficient documentation

## 2016-10-23 MED ORDER — IOPAMIDOL (ISOVUE-300) INJECTION 61%
INTRAVENOUS | Status: AC
  Administered 2016-10-23: 100 mL
  Filled 2016-10-23: qty 100
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
## 2016-10-23 NOTE — ED Notes (Signed)
Pt complaining of left and right shoulder, cervical spine, and left flank area pain.  Ambulatory. All extremities move without limitation.
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
## 2016-10-23 NOTE — ED Notes (Signed)
In XR and CT
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
## 2016-11-07 ENCOUNTER — Ambulatory Visit (HOSPITAL_COMMUNITY)
Admission: EM | Admit: 2016-11-07 | Discharge: 2016-11-07 | Disposition: A | Discharge status: Home / Self Care | Payer: Self-pay | Attending: Internal Medicine | Admitting: Internal Medicine

## 2016-11-07 ENCOUNTER — Encounter (HOSPITAL_COMMUNITY): Payer: Self-pay | Admitting: Family Medicine

## 2016-11-07 DIAGNOSIS — M5442 Lumbago with sciatica, left side: Secondary | ICD-10-CM

## 2016-11-07 DIAGNOSIS — M25511 Pain in right shoulder: Secondary | ICD-10-CM

## 2016-11-07 DIAGNOSIS — T148XXA Other injury of unspecified body region, initial encounter: Secondary | ICD-10-CM

## 2016-11-07 DIAGNOSIS — M25532 Pain in left wrist: Secondary | ICD-10-CM

## 2016-11-07 DIAGNOSIS — M67911 Unspecified disorder of synovium and tendon, right shoulder: Secondary | ICD-10-CM

## 2016-11-07 MED ORDER — PREDNISONE 50 MG PO TABS: ORAL_TABLET | ORAL | 0 refills | Status: DC

## 2016-11-07 NOTE — Discharge Instructions (Signed)
Follow-up with physical therapy for evaluation and treatment of the right shoulder. If this conservative therapy is not helping then follow-up with the orthopedist. Use ice over the shoulder, limit any overhead work with the arm and follow instructions as above. If The pain is getting worse that would indicate an immediate need to see the orthopedist as well. Apply heat to the left lower back musculature and perform stretches frequently. No heavy lifting, bending, pulling or other activity that exacerbates the pain. The pain going down the back of your thigh may be a nerve-type pain called radicular pain. Hopefully the prednisone will help.
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
## 2017-01-13 ENCOUNTER — Ambulatory Visit: Payer: Self-pay | Admitting: Emergency Medicine

## 2017-01-20 ENCOUNTER — Ambulatory Visit (INDEPENDENT_AMBULATORY_CARE_PROVIDER_SITE_OTHER): Payer: 59 | Admitting: Emergency Medicine

## 2017-01-20 ENCOUNTER — Encounter: Payer: Self-pay | Admitting: Emergency Medicine

## 2017-01-20 VITALS — BP 150/89 | HR 57 | Temp 98.7°F | Resp 17 | Ht 74.5 in | Wt 227.0 lb

## 2017-01-20 DIAGNOSIS — R109 Unspecified abdominal pain: Secondary | ICD-10-CM

## 2017-01-20 DIAGNOSIS — R10A2 Flank pain, left side: Secondary | ICD-10-CM

## 2017-01-20 DIAGNOSIS — R634 Abnormal weight loss: Secondary | ICD-10-CM | POA: Diagnosis not present

## 2017-01-20 DIAGNOSIS — N281 Cyst of kidney, acquired: Secondary | ICD-10-CM | POA: Insufficient documentation

## 2017-01-20 DIAGNOSIS — M1009 Idiopathic gout, multiple sites: Secondary | ICD-10-CM | POA: Diagnosis not present

## 2017-01-20 MED ORDER — COLCHICINE 0.6 MG PO TABS: ORAL_TABLET | ORAL | 3 refills | Status: DC

## 2017-01-20 NOTE — Progress Notes (Signed)
Jeremiah Webb 56 y.o.   Chief Complaint  Patient presents with  . labs    HISTORY OF PRESENT ILLNESS: This is a 57 y.o. male complaining of losing weight, approx. 20 lbs in 1-2 months. Has occasional pain to left flank and also to left testicle but no testicular lumps; denies hematuria, n/v, rectal bleeding; had normal colonoscopy 2 years ago. Denies cough or SOB.  HPI   Prior to Admission medications   Medication Sig Start Date End Date Taking? Authorizing Provider  levothyroxine (SYNTHROID, LEVOTHROID) 150 MCG tablet TAKE ONE TABLET BY MOUTH ONCE DAILY 06/18/16  Yes Daub, Loura Back, MD  COLCRYS 0.6 MG tablet TAKE 2 TABLETS BY MOUTH NOW THEN 1 TABLET 1 HOUR LATER Patient not taking: Reported on 01/20/2017 09/23/16   Silverio Decamp, MD  testosterone cypionate (DEPOTESTOSTERONE CYPIONATE) 200 MG/ML injection INJECT 1.5MLS INTO THE MUSCLES EVERY 14 DAYS Patient not taking: Reported on 01/20/2017 09/20/16   Silverio Decamp, MD    Allergies  Allergen Reactions  . Shrimp [Shellfish Allergy]     Only shrimp causes indigestion. No epi-pen necessary.    Patient Active Problem List   Diagnosis Date Noted  . Right shoulder pain 06/24/2016  . Male hypogonadism 06/06/2016  . Infrapatellar bursitis of right knee 12/19/2015  . Crystalline arthropathy with gout and pseudogout 09/22/2015  . Palpitations 02/14/2015  . Cardiovascular risk factor 02/14/2015  . Herniated nucleus pulposus 09/20/2014  . Degenerative disc disease, cervical 04/05/2014    Past Medical History:  Diagnosis Date  . Hypothyroidism   . Sleep apnea    no cpap  . Thyroid disease    Hypothyroid    Past Surgical History:  Procedure Laterality Date  . CERVICAL DISC SURGERY     #5  . POSTERIOR CERVICAL LAMINECTOMY WITH MET- RX Left 09/20/2014   Procedure: Left C7-T1 "Sitting" Microdiskectomy;  Surgeon: Kristeen Miss, MD;  Location: [facility];  Service: Neurosurgery;  Laterality: Left;  Left C7-T1  "Sitting" Microdiskectomy  . QUADRICEPS TENDON REPAIR    . SHOULDER SURGERY      Social History   Social History  . Marital status: Single    Spouse name: N/A  . Number of children: N/A  . Years of education: N/A   Occupational History  . Not on file.   Social History Main Topics  . Smoking status: Never Smoker  . Smokeless tobacco: Never Used  . Alcohol use 0.0 oz/week     Comment: occasional  . Drug use: No  . Sexual activity: No   Other Topics Concern  . Not on file   Social History Narrative  . No narrative on file    Family History  Problem Relation Age of Onset  . Heart disease Mother   . Stroke Mother   . Diabetes Father   . Colon cancer Maternal Grandfather   . Esophageal cancer Neg Hx   . Rectal cancer Neg Hx   . Stomach cancer Neg Hx      Review of Systems  Constitutional: Positive for weight loss. Negative for chills and fever.  HENT: Negative.  Negative for congestion, nosebleeds and sore throat.   Eyes: Negative.  Negative for blurred vision and double vision.  Respiratory: Negative.  Negative for cough, hemoptysis and shortness of breath.   Cardiovascular: Negative.  Negative for chest pain, palpitations, claudication and leg swelling.  Gastrointestinal: Negative for abdominal pain, blood in stool, diarrhea, heartburn, melena, nausea and vomiting.  Genitourinary: Positive for flank  pain (left). Negative for dysuria and hematuria.  Musculoskeletal: Negative for back pain, myalgias and neck pain.  Skin: Negative for rash.  Neurological: Negative for dizziness, sensory change, focal weakness and headaches.  Endo/Heme/Allergies: Negative.   All other systems reviewed and are negative.  Vitals:   01/20/17 0939  BP: (!) 150/89  Pulse: (!) 57  Resp: 17  Temp: 98.7 F (37.1 C)     Physical Exam  Constitutional: He is oriented to person, place, and time. He appears well-developed and well-nourished.  HENT:  Head: Normocephalic and atraumatic.   Nose: Nose normal.  Mouth/Throat: Oropharynx is clear and moist.  Eyes: Conjunctivae and EOM are normal. Pupils are equal, round, and reactive to light.  Neck: Normal range of motion. Neck supple. No JVD present. No thyromegaly present.  Cardiovascular: Normal rate, regular rhythm and normal heart sounds.   Pulmonary/Chest: Effort normal and breath sounds normal.  Abdominal: Soft. Bowel sounds are normal. He exhibits no distension. There is no tenderness.  Musculoskeletal: Normal range of motion.  Lymphadenopathy:    He has no cervical adenopathy.  Neurological: He is alert and oriented to person, place, and time. No sensory deficit. He exhibits normal muscle tone.  Skin: Skin is warm and dry. Capillary refill takes less than 2 seconds.  Psychiatric: He has a normal mood and affect. His behavior is normal.  Vitals reviewed.  CT chest/A-P done 10/23/16 in the ER show left kidney cysts; pt was not aware of this.  ASSESSMENT & PLAN: Jeremiah Webb was seen today for labs.  Diagnoses and all orders for this visit:  Weight loss -     CBC with Differential/Platelet -     Comprehensive metabolic panel -     PSA -     Uric Acid -     Cancel: Testosterone Total,Free,Bio, Males -     Cancel: Thyroid Profile -     Thyroid Profile -     Testosterone  Cyst of left kidney -     Ambulatory referral to Nephrology  Left flank pain  Idiopathic gout of multiple sites, unspecified chronicity  Other orders -     colchicine (COLCRYS) 0.6 MG tablet; TAKE 2 TABLETS BY MOUTH NOW THEN 1 TABLET 1 HOUR LATER   Patient Instructions   Flank Pain Flank pain is pain in your side. The flank is the area of your side between your upper belly (abdomen) and your back. The pain may occur over a short period of time (acute) or may be long-term or come back often (chronic). It may be mild or very bad. Pain in this area can be caused by many different things. Follow these instructions at home:  Rest as told by  your doctor.  Drink enough fluid to keep your pee (urine) clear or pale yellow.  Take over-the-counter and prescription medicines only as told by your doctor.  Keep all follow-up visits as told by your doctor. This is important. Contact a doctor if:  Medicine does not help your pain.  You have new symptoms.  Your pain gets worse.  You have a fever.  Your symptoms last longer than 2-3 days. Get help right away if:  Your tummy hurts or is swollen.  You are short of breath.  You feel sick to your stomach (nauseous) and it does not go away.  You cannot stop throwing up (vomiting).  You feel like you will pass out or you do pass out (faint).  You have blood in your pee.  You have a fever and your symptoms suddenly get worse. This information is not intended to replace advice given to you by your health care provider. Make sure you discuss any questions you have with your health care provider. Document Released: 05/07/2008 Document Revised: 04/19/2016 Document Reviewed: 05/02/2015 Elsevier Interactive Patient Education  2018 [employer].     IF you received an x-ray today, you will receive an invoice from [facility]. Please contact [facility] at [phone] with questions or concerns regarding your invoice.   IF you received labwork today, you will receive an invoice from [facility]. Please contact [facility] at [phone] with questions or concerns regarding your invoice.   Our billing staff will not be able to assist you with questions regarding bills from these companies.  You will be contacted with the lab results as soon as they are available. The fastest way to get your results is to activate your My Chart account. Instructions are located on the last page of this paperwork. If you have not heard from [place] regarding the results in 2 weeks, please contact this office.          Agustina Caroli, MD [facility]

## 2017-01-20 NOTE — Patient Instructions (Addendum)
Flank Pain Flank pain is pain in your side. The flank is the area of your side between your upper belly (abdomen) and your back. The pain may occur over a short period of time (acute) or may be long-term or come back often (chronic). It may be mild or very bad. Pain in this area can be caused by many different things. Follow these instructions at home:  Rest as told by your doctor.  Drink enough fluid to keep your pee (urine) clear or pale yellow.  Take over-the-counter and prescription medicines only as told by your doctor.  Keep all follow-up visits as told by your doctor. This is important. Contact a doctor if:  Medicine does not help your pain.  You have new symptoms.  Your pain gets worse.  You have a fever.  Your symptoms last longer than 2-3 days. Get help right away if:  Your tummy hurts or is swollen.  You are short of breath.  You feel sick to your stomach (nauseous) and it does not go away.  You cannot stop throwing up (vomiting).  You feel like you will pass out or you do pass out (faint).  You have blood in your pee.  You have a fever and your symptoms suddenly get worse. This information is not intended to replace advice given to you by your health care provider. Make sure you discuss any questions you have with your health care provider. Document Released: 05/07/2008 Document Revised: 04/19/2016 Document Reviewed: 05/02/2015 Elsevier Interactive Patient Education  2018 [employer].     IF you received an x-ray today, you will receive an invoice from [facility]. Please contact [facility] at [phone] with questions or concerns regarding your invoice.   IF you received labwork today, you will receive an invoice from [facility]. Please contact [facility] at [phone] with questions or concerns regarding your invoice.   Our billing staff will not be able to assist you with questions regarding bills from these companies.  You will be  contacted with the lab results as soon as they are available. The fastest way to get your results is to activate your My Chart account. Instructions are located on the last page of this paperwork. If you have not heard from [place] regarding the results in 2 weeks, please contact this office.

## 2017-01-21 LAB — COMPREHENSIVE METABOLIC PANEL
A/G RATIO: 1.4 (ref 1.2–2.2)
ALBUMIN: 3.9 g/dL (ref 3.5–5.5)
ALT: 43 IU/L (ref 0–44)
AST: 29 IU/L (ref 0–40)
Alkaline Phosphatase: 54 IU/L (ref 39–117)
BUN/Creatinine Ratio: 11 (ref 9–20)
BUN: 15 mg/dL (ref 6–24)
Bilirubin Total: 0.5 mg/dL (ref 0.0–1.2)
CALCIUM: 9.2 mg/dL (ref 8.7–10.2)
CO2: 25 mmol/L (ref 20–29)
CREATININE: 1.38 mg/dL — AB (ref 0.76–1.27)
Chloride: 108 mmol/L — ABNORMAL HIGH (ref 96–106)
GFR, EST AFRICAN AMERICAN: 67 mL/min/{1.73_m2} (ref >59)
GFR, EST NON AFRICAN AMERICAN: 58 mL/min/{1.73_m2} — AB (ref >59)
GLOBULIN, TOTAL: 2.8 g/dL (ref 1.5–4.5)
Glucose: 110 mg/dL — ABNORMAL HIGH (ref 65–99)
POTASSIUM: 4.4 mmol/L (ref 3.5–5.2)
SODIUM: 146 mmol/L — AB (ref 134–144)
TOTAL PROTEIN: 6.7 g/dL (ref 6.0–8.5)

## 2017-01-21 LAB — CBC WITH DIFFERENTIAL/PLATELET
BASOS: 1 %
Basophils Absolute: 0 10*3/uL (ref 0.0–0.2)
EOS (ABSOLUTE): 0.9 10*3/uL — ABNORMAL HIGH (ref 0.0–0.4)
EOS: 17 %
HEMATOCRIT: 42.5 % (ref 37.5–51.0)
HEMOGLOBIN: 15 g/dL (ref 13.0–17.7)
IMMATURE GRANS (ABS): 0 10*3/uL (ref 0.0–0.1)
IMMATURE GRANULOCYTES: 0 %
LYMPHS: 35 %
Lymphocytes Absolute: 1.9 10*3/uL (ref 0.7–3.1)
MCH: 30.9 pg (ref 26.6–33.0)
MCHC: 35.3 g/dL (ref 31.5–35.7)
MCV: 87 fL (ref 79–97)
MONOCYTES: 7 %
MONOS ABS: 0.4 10*3/uL (ref 0.1–0.9)
NEUTROS PCT: 40 %
Neutrophils Absolute: 2.2 10*3/uL (ref 1.4–7.0)
Platelets: 275 10*3/uL (ref 150–379)
RBC: 4.86 x10E6/uL (ref 4.14–5.80)
RDW: 13.5 % (ref 12.3–15.4)
WBC: 5.5 10*3/uL (ref 3.4–10.8)

## 2017-01-21 LAB — THYROID PANEL
FREE THYROXINE INDEX: 2.2 (ref 1.2–4.9)
T3 Uptake Ratio: 30 % (ref 24–39)
T4 TOTAL: 7.4 ug/dL (ref 4.5–12.0)

## 2017-01-21 LAB — PSA: Prostate Specific Ag, Serum: 1.1 ng/mL (ref 0.0–4.0)

## 2017-01-21 LAB — URIC ACID: URIC ACID: 8.3 mg/dL (ref 3.7–8.6)

## 2017-01-24 ENCOUNTER — Telehealth: Payer: Self-pay | Admitting: Emergency Medicine

## 2017-01-24 NOTE — Telephone Encounter (Signed)
Pt is needing to talk to someone about testosterone labs he got everything else off of my chart but not that   Best number [phone]

## 2017-01-28 NOTE — Telephone Encounter (Signed)
Can you please release lab results ? 

## 2017-01-29 LAB — SPECIMEN STATUS REPORT

## 2017-01-29 LAB — TESTOSTERONE

## 2017-01-29 NOTE — Telephone Encounter (Signed)
Will forward to Dr. Alvy Bimler as patient saw him on 01/20/17.

## 2017-01-29 NOTE — Telephone Encounter (Signed)
Called and spoke with patient, informed him that the testosterone order was placed, and the referral for nephrology office.

## 2017-01-29 NOTE — Telephone Encounter (Signed)
I ordered the testosterone for patient.  The results are yet to be placed.  Can you check on this?  Please inform patient that the lab was ordered, we are checking on this

## 2017-01-30 LAB — THYROID PANEL
Free Thyroxine Index: 2.6 (ref 1.2–4.9)
T3 UPTAKE RATIO: 33 % (ref 24–39)
T4 TOTAL: 7.9 ug/dL (ref 4.5–12.0)

## 2017-01-30 LAB — TESTOSTERONE: TESTOSTERONE: 336 ng/dL (ref 264–916)

## 2017-01-31 ENCOUNTER — Telehealth: Payer: Self-pay

## 2017-01-31 NOTE — Telephone Encounter (Signed)
PA Started Colcrys 0.6 mg  **Note from pharmacy** INS WILL COVER MITIGARE OR PA REQUIRED  Awaiting Response

## 2017-02-04 ENCOUNTER — Encounter: Payer: Self-pay | Admitting: Emergency Medicine

## 2017-02-04 ENCOUNTER — Telehealth: Payer: Self-pay

## 2017-02-04 NOTE — Telephone Encounter (Signed)
Pt has questions about his testosterone results. I sent mychart message re: canceled test.  Must have been a reorder, but it was completed.  Thanks.

## 2017-02-05 ENCOUNTER — Telehealth: Payer: Self-pay | Admitting: Emergency Medicine

## 2017-02-05 NOTE — Telephone Encounter (Signed)
PA Colcrys Denied Alternative: Mitigare  Appeal # 800) [id number]

## 2017-02-05 NOTE — Telephone Encounter (Signed)
Spoke to patient about results. He was referred to Nephrology 6/11 but has not heard from them yet. Please look into this important referral and call patient with an update. Thanks.

## 2017-02-05 NOTE — Telephone Encounter (Signed)
OK to substitute with Mitigare.

## 2017-02-05 NOTE — Telephone Encounter (Signed)
Spoke to [employer] about lab results. Referred to Nephrology 6/11 but no call yet.

## 2017-02-06 MED ORDER — COLCHICINE 0.6 MG PO CAPS: ORAL_CAPSULE | ORAL | 3 refills | Status: DC

## 2017-02-06 NOTE — Telephone Encounter (Signed)
Done

## 2017-02-06 NOTE — Telephone Encounter (Signed)
What instructions for the Mitigare? & Day supply Please advise

## 2017-02-11 ENCOUNTER — Other Ambulatory Visit: Payer: Self-pay | Admitting: Emergency Medicine

## 2017-02-11 ENCOUNTER — Other Ambulatory Visit: Payer: Self-pay | Admitting: Sports Medicine

## 2017-02-11 DIAGNOSIS — E291 Testicular hypofunction: Secondary | ICD-10-CM

## 2017-02-22 ENCOUNTER — Other Ambulatory Visit: Payer: Self-pay | Admitting: Sports Medicine

## 2017-02-27 NOTE — Telephone Encounter (Signed)
Thanks

## 2017-02-27 NOTE — Telephone Encounter (Signed)
Follow up with [facility]. They have pt's chart "in their stack" - advised that you felt this was an important referral and receptionist said she would forward note to referral staff and try to get this scheduled today.

## 2017-06-25 ENCOUNTER — Telehealth: Payer: Self-pay | Admitting: Family Medicine

## 2017-06-25 ENCOUNTER — Telehealth (HOSPITAL_COMMUNITY): Payer: Self-pay | Admitting: *Deleted

## 2017-06-25 DIAGNOSIS — I493 Ventricular premature depolarization: Secondary | ICD-10-CM

## 2017-06-25 NOTE — Telephone Encounter (Signed)
Copied from CRM [id number]. Topic: Inquiry >> Jun 25, 2017  4:47 PM Jeremiah Webb B wrote: Reason for CRM: PT called to have his synthroid refilled, the pharmacy has stated that they have contacted but no response has been made from anyone, contact if needed

## 2017-06-25 NOTE — Telephone Encounter (Signed)
Patient called saying that he was overdue for an Echo and Follow up with Dr. Shirlee Latch.  I have placed Echo order per Dr. Shirlee Latch and patient has been scheduled and is aware.

## 2017-06-26 ENCOUNTER — Other Ambulatory Visit: Payer: Self-pay | Admitting: *Deleted

## 2017-06-26 ENCOUNTER — Other Ambulatory Visit: Payer: Self-pay

## 2017-06-26 MED ORDER — LEVOTHYROXINE SODIUM 150 MCG PO TABS: 150.0000 ug | ORAL_TABLET | Freq: Every day | ORAL | 0 refills | Status: DC

## 2017-06-26 NOTE — Telephone Encounter (Signed)
Pt called for refill of [facility]othyroxine [facility] has been requesting and no response. No requests in this office.   Pt has 5 pharmacies listed. LMOVM for pt to return call for preferred pharmacy.  Last thyroid panel labs 01/2017-WNL

## 2017-06-27 ENCOUNTER — Telehealth: Payer: Self-pay | Admitting: Family Medicine

## 2017-06-27 ENCOUNTER — Telehealth: Payer: Self-pay | Admitting: Emergency Medicine

## 2017-06-27 ENCOUNTER — Other Ambulatory Visit: Payer: Self-pay | Admitting: Emergency Medicine

## 2017-06-27 DIAGNOSIS — N281 Cyst of kidney, acquired: Secondary | ICD-10-CM

## 2017-06-27 NOTE — Telephone Encounter (Signed)
Copied from CRM [id number]. Topic: Quick Communication - See Telephone Encounter >> Jun 27, 2017  8:55 AM Rudi Coco, NT wrote: CRM for notification. See Telephone encounter for:   06/27/17. Pt. Called and said that he wants his pharmacy changed to [facility] [address] [place], [place] [address] please call pt. With any questions

## 2017-06-27 NOTE — Telephone Encounter (Signed)
Thanks!  Referral placed

## 2017-06-27 NOTE — Telephone Encounter (Signed)
Urology is OK then but it's been 5 months since referral. Is it going to take this long to see Urologist? Thanks.

## 2017-06-27 NOTE — Telephone Encounter (Signed)
°    °  Reason for CRM:  Pam called from [facility] & said on the referral form and the demographics it says Left kidney cyst with flank pain    Diagnostic referral  Left kidney cyst with flank pain    Dr Fayrene Fearing Deterding, he said that the cyst and kidney are a normal variance. It was not polo cystic disease. He said that cyst do not cause flank pain. And if its for that and then it needs to go to urology. He reviewed the scans  If its for flank pain then they would be happy to see him, can call PAM back [phone] if it goes to [facility] and have her paged. Patient is scheduled in December..  Please advise on how to proceed with this thanks

## 2017-06-27 NOTE — Telephone Encounter (Signed)
Done

## 2017-06-27 NOTE — Telephone Encounter (Signed)
Copied from CRM [id number]. Topic: Referral - Question >> Jun 26, 2017  3:33 PM Jolayne Haines L wrote: Reason for CRM: Pam called from [facility] & said on the referral form and the demographics it says Left kidney cyst with flank pain  Diagnostic referral Left kidney cyst with flank pain  Dr Beryle Lathe, he said that the cyst and kidney are a normal variance. It was not polo cystic disease. He said that cyst do not cause flank pain. And if its for that and then it needs to go to urology. He reviewed the scans If its for flank pain then they would be happy to see him, can call PAM back [phone] if it goes to [facility] and have her paged. Patient is scheduled in December . Fax # [phone]

## 2017-06-27 NOTE — Telephone Encounter (Signed)
Okay no it shouldn't take that long all we need is a urology referral placed and we will process that

## 2017-06-27 NOTE — Telephone Encounter (Signed)
Not sure what the question is. Please clarify. Thanks.

## 2017-06-27 NOTE — Telephone Encounter (Signed)
Dr. Fayrene Fearing Deterding from [facility] stated that cyst does not cause flank pain and that pt may need to go to a urologist.. But will still see pt if needed.. So question is do you still want pt to see [facility] for nephrology or urology?

## 2017-07-02 ENCOUNTER — Telehealth (HOSPITAL_COMMUNITY): Payer: Self-pay

## 2017-07-15 NOTE — Telephone Encounter (Signed)
Spoke with Pam from [facility] and advised her that Dr. Alvy Bimler said it was okay for pt to go to Urology instead and that referral has been placed. She will contact the pt to cancel appt with Nephrology.

## 2017-07-31 ENCOUNTER — Encounter (HOSPITAL_COMMUNITY): Payer: Self-pay | Admitting: Cardiology

## 2017-07-31 ENCOUNTER — Ambulatory Visit (HOSPITAL_COMMUNITY): Admission: RE | Admit: 2017-07-31 | Payer: Self-pay | Source: Ambulatory Visit

## 2017-08-18 ENCOUNTER — Ambulatory Visit (HOSPITAL_COMMUNITY)
Admission: RE | Admit: 2017-08-18 | Discharge: 2017-08-18 | Disposition: A | Discharge status: Home / Self Care | Payer: BLUE CROSS/BLUE SHIELD | Source: Ambulatory Visit | Attending: Internal Medicine | Admitting: Internal Medicine

## 2017-08-18 VITALS — BP 134/98 | HR 74

## 2017-08-18 DIAGNOSIS — I493 Ventricular premature depolarization: Secondary | ICD-10-CM | POA: Diagnosis not present

## 2017-08-18 DIAGNOSIS — R002 Palpitations: Secondary | ICD-10-CM | POA: Diagnosis not present

## 2017-08-18 NOTE — Progress Notes (Signed)
Pt needed an EKG due to what was described as fluttering heart and pauses. Pt. BP elevated he says from the rapid HB, and it has caused him HA. [place], PA ordered 48 hr holter.

## 2017-08-18 NOTE — Patient Instructions (Signed)
Your physician has recommended that you wear a holter monitor. Holter monitors are medical devices that record the heart's electrical activity. Doctors most often use these monitors to diagnose arrhythmias. Arrhythmias are problems with the speed or rhythm of the heartbeat. The monitor is a small, portable device. You can wear one while you do your normal daily activities. This is usually used to diagnose what is causing palpitations/syncope (passing out).  Your physician recommends that you schedule a follow-up appointment in: Keep previous appointments

## 2017-08-20 ENCOUNTER — Ambulatory Visit (INDEPENDENT_AMBULATORY_CARE_PROVIDER_SITE_OTHER): Payer: BLUE CROSS/BLUE SHIELD

## 2017-08-20 DIAGNOSIS — R002 Palpitations: Secondary | ICD-10-CM | POA: Diagnosis not present

## 2017-08-20 DIAGNOSIS — M25561 Pain in right knee: Secondary | ICD-10-CM | POA: Diagnosis not present

## 2017-08-25 ENCOUNTER — Encounter: Payer: Self-pay | Admitting: Emergency Medicine

## 2017-08-25 DIAGNOSIS — E291 Testicular hypofunction: Secondary | ICD-10-CM | POA: Diagnosis not present

## 2017-08-25 DIAGNOSIS — N281 Cyst of kidney, acquired: Secondary | ICD-10-CM | POA: Diagnosis not present

## 2017-08-26 ENCOUNTER — Ambulatory Visit: Payer: BLUE CROSS/BLUE SHIELD | Admitting: Emergency Medicine

## 2017-08-26 ENCOUNTER — Encounter: Payer: Self-pay | Admitting: Emergency Medicine

## 2017-08-26 ENCOUNTER — Other Ambulatory Visit: Payer: Self-pay

## 2017-08-26 VITALS — BP 106/60 | HR 55 | Temp 98.6°F | Resp 16 | Ht 73.25 in | Wt 225.0 lb

## 2017-08-26 DIAGNOSIS — E291 Testicular hypofunction: Secondary | ICD-10-CM | POA: Diagnosis not present

## 2017-08-26 DIAGNOSIS — E079 Disorder of thyroid, unspecified: Secondary | ICD-10-CM | POA: Diagnosis not present

## 2017-08-26 DIAGNOSIS — E038 Other specified hypothyroidism: Secondary | ICD-10-CM | POA: Insufficient documentation

## 2017-08-26 DIAGNOSIS — M545 Low back pain: Secondary | ICD-10-CM | POA: Diagnosis not present

## 2017-08-26 DIAGNOSIS — R7989 Other specified abnormal findings of blood chemistry: Secondary | ICD-10-CM | POA: Insufficient documentation

## 2017-08-26 NOTE — Patient Instructions (Signed)
     IF you received an x-ray today, you will receive an invoice from [facility]. Please contact [facility] at [phone] with questions or concerns regarding your invoice.   IF you received labwork today, you will receive an invoice from [facility]. Please contact [facility] at [phone] with questions or concerns regarding your invoice.   Our billing staff will not be able to assist you with questions regarding bills from these companies.  You will be contacted with the lab results as soon as they are available. The fastest way to get your results is to activate your My Chart account. Instructions are located on the last page of this paperwork. If you have not heard from us regarding the results in 2 weeks, please contact this office.     

## 2017-08-26 NOTE — Progress Notes (Signed)
Jeremiah Webb 57 y.o.   Chief Complaint  Patient presents with  . Follow-up    for labwork - check TSH, TESTOSTERONE    HISTORY OF PRESENT ILLNESS: This is a 57 y.o. male has h/o hypogonadism and thyroid disorder; wants to have bloodwork.  HPI   Prior to Admission medications   Medication Sig Start Date End Date Taking? Authorizing Provider  levothyroxine (SYNTHROID, LEVOTHROID) 150 MCG tablet Take 1 tablet (150 mcg total) daily by mouth. Needs lab/office visit for additional refills 06/26/17  Yes Ellia Knowlton, Ines Bloomer, MD    Allergies  Allergen Reactions  . Shrimp [Shellfish Allergy]     Only shrimp causes indigestion. No epi-pen necessary.    Patient Active Problem List   Diagnosis Date Noted  . Weight loss 01/20/2017  . Cyst of left kidney 01/20/2017  . Left flank pain 01/20/2017  . Idiopathic gout of multiple sites 01/20/2017  . Right shoulder pain 06/24/2016  . Male hypogonadism 06/06/2016  . Infrapatellar bursitis of right knee 12/19/2015  . Crystalline arthropathy with gout and pseudogout 09/22/2015  . Palpitations 02/14/2015  . Cardiovascular risk factor 02/14/2015  . Herniated nucleus pulposus 09/20/2014  . Degenerative disc disease, cervical 04/05/2014    Past Medical History:  Diagnosis Date  . Hypothyroidism   . Sleep apnea    no cpap  . Thyroid disease    Hypothyroid    Past Surgical History:  Procedure Laterality Date  . CERVICAL DISC SURGERY     #5  . POSTERIOR CERVICAL LAMINECTOMY WITH MET- RX Left 09/20/2014   Procedure: Left C7-T1 "Sitting" Microdiskectomy;  Surgeon: Kristeen Miss, MD;  Location: [facility];  Service: Neurosurgery;  Laterality: Left;  Left C7-T1 "Sitting" Microdiskectomy  . QUADRICEPS TENDON REPAIR    . SHOULDER SURGERY      Social History   Socioeconomic History  . Marital status: Single    Spouse name: Not on file  . Number of children: Not on file  . Years of education: Not on file  . Highest education level:  Not on file  Social Needs  . Financial resource strain: Not on file  . Food insecurity - worry: Not on file  . Food insecurity - inability: Not on file  . Transportation needs - medical: Not on file  . Transportation needs - non-medical: Not on file  Occupational History  . Not on file  Tobacco Use  . Smoking status: Never Smoker  . Smokeless tobacco: Never Used  Substance and Sexual Activity  . Alcohol use: Yes    Alcohol/week: 0.0 oz    Comment: occasional  . Drug use: No  . Sexual activity: No  Other Topics Concern  . Not on file  Social History Narrative  . Not on file    Family History  Problem Relation Age of Onset  . Heart disease Mother   . Stroke Mother   . Diabetes Father   . Colon cancer Maternal Grandfather   . Esophageal cancer Neg Hx   . Rectal cancer Neg Hx   . Stomach cancer Neg Hx      Review of Systems  Constitutional: Negative.  Negative for chills and fever.  HENT: Negative.  Negative for sore throat.   Eyes: Negative.   Respiratory: Negative.  Negative for cough and shortness of breath.   Cardiovascular: Negative.  Negative for chest pain and palpitations.  Gastrointestinal: Negative.  Negative for abdominal pain, nausea and vomiting.  Genitourinary: Negative.  Negative for dysuria and  hematuria.  Musculoskeletal: Negative.  Negative for back pain, myalgias and neck pain.  Skin: Negative.  Negative for rash.  Neurological: Negative.  Negative for dizziness and headaches.  Endo/Heme/Allergies: Negative.   All other systems reviewed and are negative.   Vitals:   08/26/17 1433  BP: 106/60  Pulse: (!) 55  Resp: 16  Temp: 98.6 F (37 C)  SpO2: 96%    Physical Exam  Constitutional: He is oriented to person, place, and time. He appears well-developed and well-nourished.  HENT:  Head: Normocephalic.  Eyes: Pupils are equal, round, and reactive to light.  Neck: Normal range of motion.  Cardiovascular: Normal rate.  Pulmonary/Chest:  Effort normal.  Musculoskeletal: Normal range of motion.  Neurological: He is alert and oriented to person, place, and time.  Skin: Skin is warm and dry. Capillary refill takes less than 2 seconds.  Psychiatric: He has a normal mood and affect. His behavior is normal.  Vitals reviewed.    ASSESSMENT & PLAN: Kolin was seen today for follow-up.  Diagnoses and all orders for this visit:  Male hypogonadism -     TestT+TestF+SHBG -     Ambulatory referral to Endocrinology  Thyroid disease -     Thyroid Profile      Agustina Caroli, MD [facility]

## 2017-08-27 ENCOUNTER — Other Ambulatory Visit: Payer: Self-pay | Admitting: Emergency Medicine

## 2017-08-27 ENCOUNTER — Telehealth: Payer: Self-pay

## 2017-08-27 DIAGNOSIS — E291 Testicular hypofunction: Secondary | ICD-10-CM

## 2017-08-27 MED ORDER — TESTOSTERONE CYPIONATE 200 MG/ML IM SOLN: INTRAMUSCULAR | 0 refills | Status: DC

## 2017-08-27 NOTE — Telephone Encounter (Signed)
Prescription has been sent to pharmacy, he needs to return 1 week after his second injection to recheck testosterone levels, CBC, PSA, orders placed.

## 2017-08-27 NOTE — Telephone Encounter (Signed)
Pt.notified

## 2017-08-27 NOTE — Telephone Encounter (Signed)
Pt left VM asking if he can start back on testosterone and what would be the steps. Also stated that he no longer has the kidney cyst or stones. Please assist.

## 2017-08-28 DIAGNOSIS — M545 Low back pain: Secondary | ICD-10-CM | POA: Diagnosis not present

## 2017-08-29 ENCOUNTER — Telehealth: Payer: Self-pay | Admitting: *Deleted

## 2017-08-29 LAB — THYROID PANEL
FREE THYROXINE INDEX: 2.3 (ref 1.2–4.9)
T3 UPTAKE RATIO: 30 % (ref 24–39)
T4 TOTAL: 7.7 ug/dL (ref 4.5–12.0)

## 2017-08-29 LAB — TESTT+TESTF+SHBG
SEX HORMONE BINDING: 23 nmol/L (ref 19.3–76.4)
Testosterone, Free: 4.8 pg/mL — ABNORMAL LOW (ref 7.2–24.0)
Testosterone, total: 168.1 ng/dL — ABNORMAL LOW (ref 264.0–916.0)

## 2017-08-29 NOTE — Telephone Encounter (Signed)
Pre Authorization sent to cover my meds. [id number] This may be denied. Patient had two low labs but they were not obtained in the early am

## 2017-09-02 ENCOUNTER — Telehealth (HOSPITAL_COMMUNITY): Payer: Self-pay | Admitting: *Deleted

## 2017-09-02 NOTE — Telephone Encounter (Signed)
Result Notes for Holter monitor - 48 hour   Notes recorded by Georgina Peer, RN on 09/02/2017 at 4:14 PM EST Patient called for results. Results given and he stated he would discuss in more detail with Dr. Shirlee Latch at his upcoming appointment. No further questions at this time. ------  Notes recorded by Laurey Morale, MD on 08/31/2017 at 10:16 PM EST Occasional PACs and PVCs, also sinus tachycardia noted. These could be cause of palpitations.

## 2017-09-03 DIAGNOSIS — M25561 Pain in right knee: Secondary | ICD-10-CM | POA: Diagnosis not present

## 2017-09-03 DIAGNOSIS — M25532 Pain in left wrist: Secondary | ICD-10-CM | POA: Diagnosis not present

## 2017-09-04 ENCOUNTER — Telehealth: Payer: Self-pay | Admitting: Sports Medicine

## 2017-09-04 DIAGNOSIS — E291 Testicular hypofunction: Secondary | ICD-10-CM

## 2017-09-04 NOTE — Telephone Encounter (Signed)
Their reason for denial was they did not know what time the testosterone levels were collected, and they were collected it looks like at 10:00 in the morning.  There were 2 levels that were low, he has been on testosterone since.  Please resubmit.  [address] [place], [place] #[id number], reference number [id number].

## 2017-09-04 NOTE — Telephone Encounter (Signed)
Received fax from [facility] denying coverage on Testosterone due to request not meeting definition of medical necessity found in members benefit booklet. I am placing in providers box. - CF

## 2017-09-05 ENCOUNTER — Ambulatory Visit: Payer: BLUE CROSS/BLUE SHIELD | Admitting: Emergency Medicine

## 2017-09-05 ENCOUNTER — Other Ambulatory Visit: Payer: Self-pay

## 2017-09-05 ENCOUNTER — Encounter: Payer: Self-pay | Admitting: Emergency Medicine

## 2017-09-05 VITALS — BP 132/78 | HR 109 | Temp 98.2°F | Resp 16 | Wt 229.4 lb

## 2017-09-05 DIAGNOSIS — R109 Unspecified abdominal pain: Secondary | ICD-10-CM | POA: Diagnosis not present

## 2017-09-05 DIAGNOSIS — Z87448 Personal history of other diseases of urinary system: Secondary | ICD-10-CM | POA: Diagnosis not present

## 2017-09-05 DIAGNOSIS — N281 Cyst of kidney, acquired: Secondary | ICD-10-CM

## 2017-09-05 NOTE — Telephone Encounter (Signed)
I have placed orders for 2 more testosterone levels to be done in the morning.

## 2017-09-05 NOTE — Progress Notes (Signed)
Jeremiah Webb 57 y.o.   Chief Complaint  Patient presents with  . Flank Pain    x 5 days kidney area    HISTORY OF PRESENT ILLNESS: This is a 57 y.o. male complaining of pain to left flank area x the past 4-5 days; no associated symptoms; CT scan A/P done 10 months ago showed left kidney cyst; was seen by me and then Urologist on referral.  HPI   Prior to Admission medications   Medication Sig Start Date End Date Taking? Authorizing Provider  levothyroxine (SYNTHROID, LEVOTHROID) 150 MCG tablet TAKE 1 TABLET BY MOUTH ONCE DAILY ** NEEDS OFFICE VISIT FOR ADDDITIONAL REFILLS** 08/27/17  Yes Candus Braud, Ines Bloomer, MD  testosterone cypionate (DEPOTESTOSTERONE CYPIONATE) 200 MG/ML injection INJECT 1.5MLS INTO THE MUSCLES EVERY 14 DAYS. 08/27/17  Yes Silverio Decamp, MD    Allergies  Allergen Reactions  . Shrimp [Shellfish Allergy]     Only shrimp causes indigestion. No epi-pen necessary.    Patient Active Problem List   Diagnosis Date Noted  . Thyroid disease 08/26/2017  . Weight loss 01/20/2017  . Cyst of left kidney 01/20/2017  . Left flank pain 01/20/2017  . Idiopathic gout of multiple sites 01/20/2017  . Right shoulder pain 06/24/2016  . Male hypogonadism 06/06/2016  . Infrapatellar bursitis of right knee 12/19/2015  . Crystalline arthropathy with gout and pseudogout 09/22/2015  . Palpitations 02/14/2015  . Cardiovascular risk factor 02/14/2015  . Herniated nucleus pulposus 09/20/2014  . Degenerative disc disease, cervical 04/05/2014    Past Medical History:  Diagnosis Date  . Hypothyroidism   . Sleep apnea    no cpap  . Thyroid disease    Hypothyroid    Past Surgical History:  Procedure Laterality Date  . CERVICAL DISC SURGERY     #5  . POSTERIOR CERVICAL LAMINECTOMY WITH MET- RX Left 09/20/2014   Procedure: Left C7-T1 "Sitting" Microdiskectomy;  Surgeon: Kristeen Miss, MD;  Location: [facility];  Service: Neurosurgery;  Laterality: Left;  Left C7-T1  "Sitting" Microdiskectomy  . QUADRICEPS TENDON REPAIR    . SHOULDER SURGERY      Social History   Socioeconomic History  . Marital status: Single    Spouse name: Not on file  . Number of children: Not on file  . Years of education: Not on file  . Highest education level: Not on file  Social Needs  . Financial resource strain: Not on file  . Food insecurity - worry: Not on file  . Food insecurity - inability: Not on file  . Transportation needs - medical: Not on file  . Transportation needs - non-medical: Not on file  Occupational History  . Not on file  Tobacco Use  . Smoking status: Never Smoker  . Smokeless tobacco: Never Used  Substance and Sexual Activity  . Alcohol use: Yes    Alcohol/week: 0.0 oz    Comment: occasional  . Drug use: No  . Sexual activity: No  Other Topics Concern  . Not on file  Social History Narrative  . Not on file    Family History  Problem Relation Age of Onset  . Heart disease Mother   . Stroke Mother   . Diabetes Father   . Colon cancer Maternal Grandfather   . Esophageal cancer Neg Hx   . Rectal cancer Neg Hx   . Stomach cancer Neg Hx      Review of Systems  Constitutional: Negative.  Negative for chills, fever and weight loss.  HENT: Negative.   Eyes: Negative.   Respiratory: Negative.  Negative for shortness of breath.   Cardiovascular: Negative.  Negative for chest pain and palpitations.  Gastrointestinal: Negative.  Negative for abdominal pain, blood in stool, diarrhea, melena, nausea and vomiting.  Genitourinary: Positive for flank pain. Negative for dysuria, frequency, hematuria and urgency.  Musculoskeletal: Positive for back pain. Negative for myalgias and neck pain.  Skin: Negative.  Negative for rash.  Neurological: Negative.  Negative for dizziness, sensory change, focal weakness and headaches.  Endo/Heme/Allergies: Negative.   All other systems reviewed and are negative.   Vitals:   09/05/17 1410  BP: 132/78   Pulse: (!) 109  Resp: 16  Temp: 98.2 F (36.8 C)  SpO2: 95%    Physical Exam  Constitutional: He is oriented to person, place, and time. He appears well-developed and well-nourished.  HENT:  Head: Normocephalic and atraumatic.  Nose: Nose normal.  Mouth/Throat: Oropharynx is clear and moist.  Eyes: Conjunctivae and EOM are normal. Pupils are equal, round, and reactive to light.  Neck: Normal range of motion. Neck supple.  Cardiovascular: Normal rate, regular rhythm and normal heart sounds.  Pulmonary/Chest: Effort normal and breath sounds normal.  Abdominal: Soft. Bowel sounds are normal. He exhibits no distension and no mass. There is no tenderness. There is no rebound and no guarding.  Musculoskeletal: Normal range of motion.  Neurological: He is alert and oriented to person, place, and time. No sensory deficit. He exhibits normal muscle tone.  Skin: Skin is warm and dry. Capillary refill takes less than 2 seconds.  Psychiatric: He has a normal mood and affect. His behavior is normal.  Vitals reviewed.  A total of 25 minutes was spent in the room with the patient, greater than 50% of which was in counseling/coordination of care.   ASSESSMENT & PLAN: Viraj was seen today for flank pain.  Diagnoses and all orders for this visit:  Left flank pain  History of cystic kidney disease -     MR Abdomen W Wo Contrast; Future  Acquired cyst of kidney -     MR Abdomen W Wo Contrast; Future   Patient Instructions       IF you received an x-ray today, you will receive an invoice from [facility]. Please contact [facility] at [phone] with questions or concerns regarding your invoice.   IF you received labwork today, you will receive an invoice from [facility]. Please contact [facility] at [phone] with questions or concerns regarding your invoice.   Our billing staff will not be able to assist you with questions regarding bills from these  companies.  You will be contacted with the lab results as soon as they are available. The fastest way to get your results is to activate your My Chart account. Instructions are located on the last page of this paperwork. If you have not heard from [place] regarding the results in 2 weeks, please contact this office.    Flank Pain Flank pain is pain in your side. The flank is the area of your side between your upper belly (abdomen) and your back. The pain may occur over a short period of time (acute) or may be long-term or come back often (chronic). It may be mild or very bad. Pain in this area can be caused by many different things. Follow these instructions at home:  Rest as told by your doctor.  Drink enough fluid to keep your pee (urine) clear or pale yellow.  Take  over-the-counter and prescription medicines only as told by your doctor.  Keep all follow-up visits as told by your doctor. This is important. Contact a doctor if:  Medicine does not help your pain.  You have new symptoms.  Your pain gets worse.  You have a fever.  Your symptoms last longer than 2-3 days. Get help right away if:  Your tummy hurts or is swollen.  You are short of breath.  You feel sick to your stomach (nauseous) and it does not go away.  You cannot stop throwing up (vomiting).  You feel like you will pass out or you do pass out (faint).  You have blood in your pee.  You have a fever and your symptoms suddenly get worse. This information is not intended to replace advice given to you by your health care provider. Make sure you discuss any questions you have with your health care provider. Document Released: 05/07/2008 Document Revised: 04/19/2016 Document Reviewed: 05/02/2015 Elsevier Interactive Patient Education  2018 Elsevier Inc.      Agustina Caroli, MD [facility]

## 2017-09-05 NOTE — Patient Instructions (Addendum)
     IF you received an x-ray today, you will receive an invoice from [facility]. Please contact [facility] at [phone] with questions or concerns regarding your invoice.   IF you received labwork today, you will receive an invoice from [facility]. Please contact [facility] at [phone] with questions or concerns regarding your invoice.   Our billing staff will not be able to assist you with questions regarding bills from these companies.  You will be contacted with the lab results as soon as they are available. The fastest way to get your results is to activate your My Chart account. Instructions are located on the last page of this paperwork. If you have not heard from us regarding the results in 2 weeks, please contact this office.    Flank Pain Flank pain is pain in your side. The flank is the area of your side between your upper belly (abdomen) and your back. The pain may occur over a short period of time (acute) or may be long-term or come back often (chronic). It may be mild or very bad. Pain in this area can be caused by many different things. Follow these instructions at home:  Rest as told by your doctor.  Drink enough fluid to keep your pee (urine) clear or pale yellow.  Take over-the-counter and prescription medicines only as told by your doctor.  Keep all follow-up visits as told by your doctor. This is important. Contact a doctor if:  Medicine does not help your pain.  You have new symptoms.  Your pain gets worse.  You have a fever.  Your symptoms last longer than 2-3 days. Get help right away if:  Your tummy hurts or is swollen.  You are short of breath.  You feel sick to your stomach (nauseous) and it does not go away.  You cannot stop throwing up (vomiting).  You feel like you will pass out or you do pass out (faint).  You have blood in your pee.  You have a fever and your symptoms suddenly get worse. This information is not  intended to replace advice given to you by your health care provider. Make sure you discuss any questions you have with your health care provider. Document Released: 05/07/2008 Document Revised: 04/19/2016 Document Reviewed: 05/02/2015 Elsevier Interactive Patient Education  2018 Elsevier Inc.  

## 2017-09-05 NOTE — Telephone Encounter (Signed)
Only one was at 10:00 and they actually want the labs collected earlier in the am between 7 and 9.

## 2017-09-08 ENCOUNTER — Other Ambulatory Visit: Payer: Self-pay | Admitting: Sports Medicine

## 2017-09-08 DIAGNOSIS — M5416 Radiculopathy, lumbar region: Secondary | ICD-10-CM | POA: Diagnosis not present

## 2017-09-08 DIAGNOSIS — E291 Testicular hypofunction: Secondary | ICD-10-CM

## 2017-09-08 NOTE — Progress Notes (Signed)
Reordered labs due to incorrect barcode.

## 2017-09-09 DIAGNOSIS — M25561 Pain in right knee: Secondary | ICD-10-CM | POA: Diagnosis not present

## 2017-09-09 DIAGNOSIS — M5416 Radiculopathy, lumbar region: Secondary | ICD-10-CM | POA: Diagnosis not present

## 2017-09-10 DIAGNOSIS — M5126 Other intervertebral disc displacement, lumbar region: Secondary | ICD-10-CM | POA: Diagnosis not present

## 2017-09-10 DIAGNOSIS — M5416 Radiculopathy, lumbar region: Secondary | ICD-10-CM | POA: Diagnosis not present

## 2017-09-10 DIAGNOSIS — M545 Low back pain: Secondary | ICD-10-CM | POA: Diagnosis not present

## 2017-09-11 DIAGNOSIS — M5126 Other intervertebral disc displacement, lumbar region: Secondary | ICD-10-CM | POA: Diagnosis not present

## 2017-09-11 DIAGNOSIS — M5416 Radiculopathy, lumbar region: Secondary | ICD-10-CM | POA: Diagnosis not present

## 2017-09-11 DIAGNOSIS — M545 Low back pain: Secondary | ICD-10-CM | POA: Diagnosis not present

## 2017-09-12 DIAGNOSIS — M1711 Unilateral primary osteoarthritis, right knee: Secondary | ICD-10-CM | POA: Diagnosis not present

## 2017-09-16 DIAGNOSIS — M5126 Other intervertebral disc displacement, lumbar region: Secondary | ICD-10-CM | POA: Diagnosis not present

## 2017-09-16 DIAGNOSIS — M545 Low back pain: Secondary | ICD-10-CM | POA: Diagnosis not present

## 2017-09-16 DIAGNOSIS — M5416 Radiculopathy, lumbar region: Secondary | ICD-10-CM | POA: Diagnosis not present

## 2017-09-18 ENCOUNTER — Telehealth: Payer: Self-pay

## 2017-09-18 NOTE — Telephone Encounter (Signed)
Copied from CRM [id number]. Topic: General - Other >> Sep 17, 2017  3:58 PM Cipriano Bunker wrote: Reason for CRM: Aram Beecham from [facility] called had appt. On 1/30 and he no showed.

## 2017-09-18 NOTE — Telephone Encounter (Signed)
FYI

## 2017-09-19 ENCOUNTER — Encounter (HOSPITAL_COMMUNITY): Payer: Self-pay | Admitting: Cardiology

## 2017-09-19 ENCOUNTER — Other Ambulatory Visit: Payer: Self-pay

## 2017-09-19 ENCOUNTER — Ambulatory Visit (HOSPITAL_BASED_OUTPATIENT_CLINIC_OR_DEPARTMENT_OTHER)
Admission: RE | Admit: 2017-09-19 | Discharge: 2017-09-19 | Disposition: A | Discharge status: Home / Self Care | Payer: BLUE CROSS/BLUE SHIELD | Source: Ambulatory Visit | Attending: Cardiology | Admitting: Cardiology

## 2017-09-19 ENCOUNTER — Ambulatory Visit (HOSPITAL_COMMUNITY)
Admission: RE | Admit: 2017-09-19 | Discharge: 2017-09-19 | Disposition: A | Discharge status: Home / Self Care | Payer: BLUE CROSS/BLUE SHIELD | Source: Ambulatory Visit | Attending: Cardiology | Admitting: Cardiology

## 2017-09-19 VITALS — BP 122/75 | HR 75 | Wt 230.5 lb

## 2017-09-19 DIAGNOSIS — R002 Palpitations: Secondary | ICD-10-CM | POA: Insufficient documentation

## 2017-09-19 DIAGNOSIS — Z7982 Long term (current) use of aspirin: Secondary | ICD-10-CM | POA: Diagnosis not present

## 2017-09-19 DIAGNOSIS — Z79899 Other long term (current) drug therapy: Secondary | ICD-10-CM | POA: Insufficient documentation

## 2017-09-19 DIAGNOSIS — E039 Hypothyroidism, unspecified: Secondary | ICD-10-CM | POA: Diagnosis not present

## 2017-09-19 DIAGNOSIS — G4733 Obstructive sleep apnea (adult) (pediatric): Secondary | ICD-10-CM | POA: Insufficient documentation

## 2017-09-19 DIAGNOSIS — I493 Ventricular premature depolarization: Secondary | ICD-10-CM | POA: Insufficient documentation

## 2017-09-19 DIAGNOSIS — E785 Hyperlipidemia, unspecified: Secondary | ICD-10-CM | POA: Diagnosis not present

## 2017-09-19 DIAGNOSIS — Z8249 Family history of ischemic heart disease and other diseases of the circulatory system: Secondary | ICD-10-CM | POA: Diagnosis not present

## 2017-09-19 DIAGNOSIS — M109 Gout, unspecified: Secondary | ICD-10-CM | POA: Diagnosis not present

## 2017-09-19 LAB — ECHOCARDIOGRAM COMPLETE: WEIGHTICAEL: 3688 [oz_av]

## 2017-09-19 LAB — LIPID PANEL
CHOL/HDL RATIO: 3.9 ratio
CHOLESTEROL: 141 mg/dL (ref 0–200)
HDL: 36 mg/dL — AB (ref >40)
LDL Cholesterol: 66 mg/dL (ref 0–99)
Triglycerides: 197 mg/dL — ABNORMAL HIGH (ref <150)
VLDL: 39 mg/dL (ref 0–40)

## 2017-09-19 NOTE — Progress Notes (Signed)
  Echocardiogram 2D Echocardiogram has been performed.  Celene Skeen 09/19/2017, 1:58 PM

## 2017-09-19 NOTE — Patient Instructions (Signed)
Labs drawn today (if we do not call you, then your lab work was stable)   Your physician recommends that you schedule a follow-up appointment in: 1 year with Dr. Shirlee Latch  (we will call you)

## 2017-09-19 NOTE — Telephone Encounter (Signed)
Thanks

## 2017-09-21 NOTE — Progress Notes (Signed)
Patient ID: Jeremiah Webb, male   DOB: 03-09-1961, 57 y.o.   MRN: [id number] PCP: Georgina Quint, MD  Cardiology: Dr. Shirlee Latch  57 y.o. with history of PVCs returns for cardiology followup.  Patient has a long history of PVCs.  He had a workup back in 2008 for frequent PVCs.  At that time, he had a stress echo which was normal.  He has symptoms on and off.  When he is symptomatic, he feels skipped beats and palpitations.  Holter monitor in 1/19 showed occasional PVCs and PACs. Echo done in 8/16 showed EF 55-60% with increased echogenicity of septum.  Given PVCs and abnormal-appearing septum, cardiac MRI was done in 9/16.  This showed EF 66%, no LGE, mild noncompaction towards the apex.  Coronary artery calcium scan in 6/17 showed CAC 0 Agatston units.   Echo today showed EF 55-60%, normal RV.  He returns today for followup of PVCs. He feels occasional palpitations, usually at night when lying in bed.  They are not particularly bothersome.  No syncope/presyncope. No exertional dyspnea or chest pain.  He does not drink caffeine, he exercises regularly, and he gets adequate sleep.     ECG: NSR, normal  Labs (7/16): K 4, creatinine 1.42, TSH 31, free T4 low, LDL 96 Labs (1/17): K 4.8, creatinine 1.39  PMH: 1. Hypothyroidism 2. OSA: Not using CPAP 3. C-spine arthritis 4. H/o PVCs: Stress echo in 7/08 was normal.  Holter (6/30) with very rare PVCs. Echo (8/16) with EF 55-60%, increased echogenicity in septum.  Cardiac MRI was done because of abnormal septum and PVCs, showing EF 66%, no LGE, mild noncompaction towards the apex.  - Holter (1/19): Occasional PACs and PVCs. - Echo (2/19): EF 55-60%, normal RV.  5. CT for calcium score: CAC = 0 Agatston units. 6. Gout  SH: Airline pilot, nonsmoker, 2 kids, lives in [place].  FH: Mother with CAD, MI around 20.   ROS: All systems reviewed and negative except as per HPI.   Current Outpatient Medications  Medication Sig Dispense Refill   . levothyroxine (SYNTHROID, LEVOTHROID) 150 MCG tablet TAKE 1 TABLET BY MOUTH ONCE DAILY ** NEEDS OFFICE VISIT FOR ADDDITIONAL REFILLS** 90 tablet 3  . testosterone cypionate (DEPOTESTOSTERONE CYPIONATE) 200 MG/ML injection INJECT 1.5MLS INTO THE MUSCLES EVERY 14 DAYS. 10 mL 0   No current facility-administered medications for this encounter.    BP 122/75   Pulse 75   Wt 230 lb 8 oz (104.6 kg)   SpO2 99%   BMI 30.20 kg/m  General: NAD Neck: No JVD, no thyromegaly or thyroid nodule.  Lungs: Clear to auscultation bilaterally with normal respiratory effort. CV: Nondisplaced PMI.  Heart regular S1/S2, no S3/S4, no murmur.  No peripheral edema.  No carotid bruit.  Normal pedal pulses.  Abdomen: Soft, nontender, no hepatosplenomegaly, no distention.  Skin: Intact without lesions or rashes.  Neurologic: Alert and oriented x 3.  Psych: Normal affect. Extremities: No clubbing or cyanosis.  HEENT: Normal.   Assessment/Plan: 1. PVCs: Wax and wane, feels occasional palpitations.  Echo in 8/16 showed normal EF but increased echogenicity of septum.  Cardiac MRI was done, showing EF 66%, no late gadolinium enhancement, and possible mild noncompaction towards the apex.  With normal EF, not sure what to make of the possible noncompaction.  Repeat echo in 2/19 also showed EF 55-60%, normal.  - He is not particularly symptomatic with PVCs and does not want to be treated with medications.  2. Cardiovascular risk  assessment: Patient has family history of premature CAD (mother with MI around 55).  He does not have exertional symptoms.  No diabetes, HTN, or known hyperlipidemia.  He never smoked.  Coronary artery calcium score was 0 in 6/17.  - Continue ASA 81 daily.  - Check lipids today.   Followup in 1 year.    Marca Ancona 09/21/2017

## 2017-09-22 ENCOUNTER — Telehealth: Payer: Self-pay | Admitting: Emergency Medicine

## 2017-09-22 ENCOUNTER — Encounter (HOSPITAL_COMMUNITY): Payer: Self-pay

## 2017-09-22 ENCOUNTER — Ambulatory Visit (HOSPITAL_COMMUNITY)
Admission: RE | Admit: 2017-09-22 | Discharge: 2017-09-22 | Disposition: A | Discharge status: Home / Self Care | Payer: BLUE CROSS/BLUE SHIELD | Source: Ambulatory Visit | Attending: Emergency Medicine | Admitting: Emergency Medicine

## 2017-09-22 DIAGNOSIS — M5126 Other intervertebral disc displacement, lumbar region: Secondary | ICD-10-CM | POA: Diagnosis not present

## 2017-09-22 DIAGNOSIS — M5416 Radiculopathy, lumbar region: Secondary | ICD-10-CM | POA: Diagnosis not present

## 2017-09-22 DIAGNOSIS — Z87448 Personal history of other diseases of urinary system: Secondary | ICD-10-CM

## 2017-09-22 DIAGNOSIS — M545 Low back pain: Secondary | ICD-10-CM | POA: Diagnosis not present

## 2017-09-22 DIAGNOSIS — N281 Cyst of kidney, acquired: Secondary | ICD-10-CM

## 2017-09-22 MED ORDER — GADOBENATE DIMEGLUMINE 529 MG/ML IV SOLN: 20.0000 mL | Freq: Once | INTRAVENOUS | Status: DC | PRN

## 2017-09-22 NOTE — Progress Notes (Signed)
Attempted patient for MRI, pt too claustro   Patient states he will call office to get scheduled in wide bore scanner at [facility]

## 2017-09-22 NOTE — Telephone Encounter (Signed)
Copied from CRM [id number]. Topic: Quick Communication - See Telephone Encounter >> Sep 22, 2017  4:27 PM Eston Mould B wrote: CRM for notification. See Telephone encounter for:  Charmella  from [facility] called asking for order for MRI of Abd  She needs that faxed to [phone]   She can be contacted by phone at [phone] or [phone]   09/22/17.

## 2017-09-23 ENCOUNTER — Other Ambulatory Visit: Payer: Self-pay | Admitting: Emergency Medicine

## 2017-09-23 DIAGNOSIS — N281 Cyst of kidney, acquired: Secondary | ICD-10-CM

## 2017-09-23 NOTE — Telephone Encounter (Signed)
Didn't we already requested the MRI during that visit? I believe the order was put in. Please look into this. Thanks.

## 2017-09-23 NOTE — Telephone Encounter (Signed)
Order placed. Thanks.

## 2017-09-23 NOTE — Telephone Encounter (Signed)
Spoke with Chamella and notified her of Order being placed, she will call the pt to get it scheduled

## 2017-09-23 NOTE — Telephone Encounter (Signed)
Pt seen on 09/05/17 for left flank pain. Please Advise

## 2017-09-23 NOTE — Telephone Encounter (Signed)
Jeremiah Webb and I have both checked and there is no order.

## 2017-09-25 DIAGNOSIS — M5416 Radiculopathy, lumbar region: Secondary | ICD-10-CM | POA: Diagnosis not present

## 2017-09-26 ENCOUNTER — Encounter: Payer: Self-pay | Admitting: Emergency Medicine

## 2017-09-26 DIAGNOSIS — N281 Cyst of kidney, acquired: Secondary | ICD-10-CM | POA: Diagnosis not present

## 2017-09-29 DIAGNOSIS — M5416 Radiculopathy, lumbar region: Secondary | ICD-10-CM | POA: Diagnosis not present

## 2017-09-29 DIAGNOSIS — M545 Low back pain: Secondary | ICD-10-CM | POA: Diagnosis not present

## 2017-09-29 DIAGNOSIS — M5126 Other intervertebral disc displacement, lumbar region: Secondary | ICD-10-CM | POA: Diagnosis not present

## 2017-09-29 NOTE — Telephone Encounter (Signed)
Called patient and he is going to get his labs completed at 8 am on 10/01/2017. Patient is aware our office will get the authorization from his insurance after the labs are completed. -HM

## 2017-10-01 DIAGNOSIS — M5416 Radiculopathy, lumbar region: Secondary | ICD-10-CM | POA: Diagnosis not present

## 2017-10-01 DIAGNOSIS — M545 Low back pain: Secondary | ICD-10-CM | POA: Diagnosis not present

## 2017-10-01 DIAGNOSIS — M5126 Other intervertebral disc displacement, lumbar region: Secondary | ICD-10-CM | POA: Diagnosis not present

## 2017-10-03 ENCOUNTER — Ambulatory Visit: Payer: BLUE CROSS/BLUE SHIELD

## 2017-10-04 DIAGNOSIS — E291 Testicular hypofunction: Secondary | ICD-10-CM | POA: Diagnosis not present

## 2017-10-06 DIAGNOSIS — M5416 Radiculopathy, lumbar region: Secondary | ICD-10-CM | POA: Diagnosis not present

## 2017-10-06 DIAGNOSIS — M5126 Other intervertebral disc displacement, lumbar region: Secondary | ICD-10-CM | POA: Diagnosis not present

## 2017-10-06 DIAGNOSIS — M545 Low back pain: Secondary | ICD-10-CM | POA: Diagnosis not present

## 2017-10-06 LAB — TESTOSTERONE TOTAL,FREE,BIO, MALES
Albumin: 3.8 g/dL (ref 3.6–5.1)
Sex Hormone Binding: 18 nmol/L (ref 10–50)
Testosterone: 211 ng/dL — ABNORMAL LOW (ref 250–827)

## 2017-10-08 DIAGNOSIS — M5416 Radiculopathy, lumbar region: Secondary | ICD-10-CM | POA: Diagnosis not present

## 2017-10-08 DIAGNOSIS — M545 Low back pain: Secondary | ICD-10-CM | POA: Diagnosis not present

## 2017-10-08 DIAGNOSIS — M5126 Other intervertebral disc displacement, lumbar region: Secondary | ICD-10-CM | POA: Diagnosis not present

## 2017-10-09 DIAGNOSIS — M7501 Adhesive capsulitis of right shoulder: Secondary | ICD-10-CM | POA: Diagnosis not present

## 2017-10-09 DIAGNOSIS — S46811A Strain of other muscles, fascia and tendons at shoulder and upper arm level, right arm, initial encounter: Secondary | ICD-10-CM | POA: Diagnosis not present

## 2017-10-09 DIAGNOSIS — M5416 Radiculopathy, lumbar region: Secondary | ICD-10-CM | POA: Diagnosis not present

## 2017-10-09 DIAGNOSIS — G8929 Other chronic pain: Secondary | ICD-10-CM | POA: Diagnosis not present

## 2017-10-09 DIAGNOSIS — M75101 Unspecified rotator cuff tear or rupture of right shoulder, not specified as traumatic: Secondary | ICD-10-CM | POA: Diagnosis not present

## 2017-10-10 DIAGNOSIS — M5126 Other intervertebral disc displacement, lumbar region: Secondary | ICD-10-CM | POA: Diagnosis not present

## 2017-10-13 DIAGNOSIS — M545 Low back pain: Secondary | ICD-10-CM | POA: Diagnosis not present

## 2017-10-13 DIAGNOSIS — M5126 Other intervertebral disc displacement, lumbar region: Secondary | ICD-10-CM | POA: Diagnosis not present

## 2017-10-13 DIAGNOSIS — M5416 Radiculopathy, lumbar region: Secondary | ICD-10-CM | POA: Diagnosis not present

## 2017-10-22 DIAGNOSIS — M545 Low back pain: Secondary | ICD-10-CM | POA: Diagnosis not present

## 2017-10-22 DIAGNOSIS — M5126 Other intervertebral disc displacement, lumbar region: Secondary | ICD-10-CM | POA: Diagnosis not present

## 2017-10-22 DIAGNOSIS — M5416 Radiculopathy, lumbar region: Secondary | ICD-10-CM | POA: Diagnosis not present

## 2017-10-23 DIAGNOSIS — M5416 Radiculopathy, lumbar region: Secondary | ICD-10-CM | POA: Diagnosis not present

## 2017-10-31 ENCOUNTER — Telehealth: Payer: Self-pay

## 2017-10-31 NOTE — Telephone Encounter (Signed)
Pt.notified

## 2017-10-31 NOTE — Telephone Encounter (Signed)
Levels have dropped down low to 211.  This is now 2 low morning testosterones, the first 1 was 168, the second 1 was 211, let us go ahead and get him reapproved for his testosterone.  He should then keep follow-up with his PCP regarding testosterone injections though I am happy to give him the first 1.

## 2017-10-31 NOTE — Telephone Encounter (Signed)
Pt has had testosterone checked but didn't receive results. Please assist.

## 2017-11-04 NOTE — Telephone Encounter (Signed)
Routing to [place] for PA on testosterone medication.

## 2017-11-05 NOTE — Telephone Encounter (Signed)
Received fax from [employer] that Testosterone requires a PA. Information has been sent to the insurance company. Awaiting determination.   

## 2017-11-10 DIAGNOSIS — M1711 Unilateral primary osteoarthritis, right knee: Secondary | ICD-10-CM | POA: Diagnosis not present

## 2017-11-10 NOTE — Telephone Encounter (Signed)
Spoke with Rivka Barbara at [facility] Pre-Authorization Department and she stated that the Testosterone is in Peer-to-peer review. We will get a faxed response after the decision is made. Patient is aware.

## 2017-11-11 DIAGNOSIS — M5416 Radiculopathy, lumbar region: Secondary | ICD-10-CM | POA: Diagnosis not present

## 2017-11-11 DIAGNOSIS — M545 Low back pain: Secondary | ICD-10-CM | POA: Diagnosis not present

## 2017-11-11 DIAGNOSIS — M5126 Other intervertebral disc displacement, lumbar region: Secondary | ICD-10-CM | POA: Diagnosis not present

## 2017-11-12 DIAGNOSIS — M5416 Radiculopathy, lumbar region: Secondary | ICD-10-CM | POA: Diagnosis not present

## 2017-11-12 NOTE — Telephone Encounter (Signed)
Received fax from [facility] that Testosterone was approved from 08/29/2017 until 08/11/2038. Pharmacy notified and forms sent to scan.   Reference ID: [id number].

## 2017-11-17 DIAGNOSIS — M1711 Unilateral primary osteoarthritis, right knee: Secondary | ICD-10-CM | POA: Diagnosis not present

## 2017-11-21 DIAGNOSIS — M75101 Unspecified rotator cuff tear or rupture of right shoulder, not specified as traumatic: Secondary | ICD-10-CM | POA: Diagnosis not present

## 2017-11-24 DIAGNOSIS — M1711 Unilateral primary osteoarthritis, right knee: Secondary | ICD-10-CM | POA: Diagnosis not present

## 2017-11-26 DIAGNOSIS — M5416 Radiculopathy, lumbar region: Secondary | ICD-10-CM | POA: Diagnosis not present

## 2017-12-12 DIAGNOSIS — M5416 Radiculopathy, lumbar region: Secondary | ICD-10-CM | POA: Diagnosis not present

## 2017-12-17 ENCOUNTER — Encounter: Payer: Self-pay | Admitting: Emergency Medicine

## 2017-12-17 ENCOUNTER — Telehealth: Payer: Self-pay

## 2017-12-17 ENCOUNTER — Ambulatory Visit: Payer: BLUE CROSS/BLUE SHIELD | Admitting: Emergency Medicine

## 2017-12-17 VITALS — BP 164/94 | HR 76 | Temp 98.2°F | Resp 17 | Ht 73.0 in | Wt 247.0 lb

## 2017-12-17 DIAGNOSIS — L089 Local infection of the skin and subcutaneous tissue, unspecified: Secondary | ICD-10-CM

## 2017-12-17 DIAGNOSIS — T63441A Toxic effect of venom of bees, accidental (unintentional), initial encounter: Secondary | ICD-10-CM

## 2017-12-17 DIAGNOSIS — R0600 Dyspnea, unspecified: Secondary | ICD-10-CM

## 2017-12-17 MED ORDER — CEPHALEXIN 500 MG PO CAPS: 500.0000 mg | ORAL_CAPSULE | Freq: Three times a day (TID) | ORAL | 0 refills | Status: DC

## 2017-12-17 MED ORDER — PREDNISONE 20 MG PO TABS: 40.0000 mg | ORAL_TABLET | Freq: Every day | ORAL | 0 refills | Status: DC

## 2017-12-17 NOTE — Telephone Encounter (Signed)
Pt left VM stating previous doctor has now been removed as his PCP and he would like for you to take on his care and manage his testosterone. Would like to know what you want him to do next. Please advise.

## 2017-12-17 NOTE — Progress Notes (Signed)
Jeremiah Webb 57 y.o.   Chief Complaint  Patient presents with  . Insect Bite    bee sting to arm     HISTORY OF PRESENT ILLNESS: This is a 57 y.o. male complaining of bee sting to left forearm sustained yesterday.  Complaining of redness and swelling.  Also complaining of intermittent episodes of  dyspnea with cough for the past several weeks. No other significant symptoms or medical concerns. HPI   Prior to Admission medications   Medication Sig Start Date End Date Taking? Authorizing Provider  levothyroxine (SYNTHROID, LEVOTHROID) 150 MCG tablet TAKE 1 TABLET BY MOUTH ONCE DAILY ** NEEDS OFFICE VISIT FOR ADDDITIONAL REFILLS** 08/27/17  Yes Eara Burruel, Ines Bloomer, MD  testosterone cypionate (DEPOTESTOSTERONE CYPIONATE) 200 MG/ML injection INJECT 1.5MLS INTO THE MUSCLES EVERY 14 DAYS. 08/27/17  Yes Silverio Decamp, MD    Allergies  Allergen Reactions  . Shrimp [Shellfish Allergy]     Only shrimp causes indigestion. No epi-pen necessary.    Patient Active Problem List   Diagnosis Date Noted  . 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 Thyroid disease 08/26/2017  . Weight loss 01/20/2017  . Acquired cyst of kidney 01/20/2017  . Left flank pain 01/20/2017  . Idiopathic gout of multiple sites 01/20/2017  . Right shoulder pain 06/24/2016  . Male hypogonadism 06/06/2016  . Infrapatellar bursitis of right knee 12/19/2015  . Crystalline arthropathy with gout and pseudogout 09/22/2015  . Palpitations 02/14/2015  . Cardiovascular risk factor 02/14/2015  . Herniated nucleus pulposus 09/20/2014  . Degenerative disc disease, cervical 04/05/2014    Past Medical History:  Diagnosis Date  . Hypothyroidism   . Sleep apnea    no cpap  . Thyroid disease    Hypothyroid    Past Surgical History:  Procedure Laterality Date  . CERVICAL DISC SURGERY     #5  . POSTERIOR CERVICAL LAMINECTOMY WITH MET- RX Left 09/20/2014   Procedure: Left C7-T1 "Sitting" Microdiskectomy;   Surgeon: Kristeen Miss, MD;  Location: [facility];  Service: Neurosurgery;  Laterality: Left;  Left C7-T1 "Sitting" Microdiskectomy  . QUADRICEPS TENDON REPAIR    . SHOULDER SURGERY      Social History   Socioeconomic History  . Marital status: Single    Spouse name: Not on file  . Number of children: Not on file  . Years of education: Not on file  . Highest education level: Not on file  Occupational History  . Not on file  Social Needs  . Financial resource strain: Not on file  . Food insecurity:    Worry: Not on file    Inability: Not on file  . Transportation needs:    Medical: Not on file    Non-medical: Not on file  Tobacco Use  . Smoking status: Never Smoker  . Smokeless tobacco: Never Used  Substance and Sexual Activity  . Alcohol use: Yes    Alcohol/week: 0.0 oz    Comment: occasional  . Drug use: No  . Sexual activity: Never  Lifestyle  . Physical activity:    Days per week: Not on file    Minutes per session: Not on file  . Stress: Not on file  Relationships  . Social connections:    Talks on phone: Not on file    Gets together: Not on file    Attends religious service: Not on file    Active member of club or organization: Not on file    Attends meetings of clubs  or organizations: Not on file    Relationship status: Not on file  . Intimate partner violence:    Fear of current or ex partner: Not on file    Emotionally abused: Not on file    Physically abused: Not on file    Forced sexual activity: Not on file  Other Topics Concern  . Not on file  Social History Narrative  . Not on file    Family History  Problem Relation Age of Onset  . Heart disease Mother   . Stroke Mother   . Diabetes Father   . Colon cancer Maternal Grandfather   . Esophageal cancer Neg Hx   . Rectal cancer Neg Hx   . Stomach cancer Neg Hx      Review of Systems  Constitutional: Negative.  Negative for chills and fever.  HENT: Negative.  Negative for sore throat.     Eyes: Negative.  Negative for discharge and redness.  Respiratory: Positive for cough and shortness of breath. Negative for hemoptysis, sputum production and wheezing.   Cardiovascular: Negative.  Negative for chest pain and palpitations.  Gastrointestinal: Negative.  Negative for abdominal pain, nausea and vomiting.  Genitourinary: Negative.  Negative for dysuria and urgency.  Musculoskeletal: Negative.  Negative for back pain, joint pain, myalgias and neck pain.  Skin: Positive for rash.  Neurological: Negative.  Negative for dizziness and headaches.  Endo/Heme/Allergies: Negative.   All other systems reviewed and are negative.  Vitals:   12/17/17 1353  BP: (!) 164/94  Pulse: 76  Resp: 17  Temp: 98.2 F (36.8 C)  SpO2: 98%     Physical Exam  Constitutional: He is oriented to person, place, and time. He appears well-developed and well-nourished.  HENT:  Head: Normocephalic and atraumatic.  Nose: Nose normal.  Mouth/Throat: Oropharynx is clear and moist.  Eyes: Pupils are equal, round, and reactive to light. Conjunctivae and EOM are normal.  Neck: Normal range of motion. Neck supple.  Cardiovascular: Normal rate, regular rhythm and normal heart sounds.  Pulmonary/Chest: Effort normal and breath sounds normal.  Musculoskeletal: Normal range of motion.  Lymphadenopathy:    He has no cervical adenopathy.  Neurological: He is alert and oriented to person, place, and time. No sensory deficit. He exhibits normal muscle tone.  Skin: Skin is warm and dry. Capillary refill takes less than 2 seconds.  Left upper forearm volar aspect: Erythematous area about 7 cm in diameter with no fluctuation or crepitation.  No visible stinger.  Psychiatric: He has a normal mood and affect. His behavior is normal.  Vitals reviewed.  A total of 25 minutes was spent in the room with the patient, greater than 50% of which was in counseling/coordination of care regarding differential diagnosis,  management, treatment, medications, and need for follow-up.   ASSESSMENT & PLAN: [place] was seen today for insect bite.  Diagnoses and all orders for this visit:  Skin infection -     predniSONE (DELTASONE) 20 MG tablet; Take 2 tablets (40 mg total) by mouth daily with breakfast for 5 days. -     cephALEXin (KEFLEX) 500 MG capsule; Take 1 capsule (500 mg total) by mouth 3 (three) times daily for 7 days.  Bee sting, accidental or unintentional, initial encounter -     predniSONE (DELTASONE) 20 MG tablet; Take 2 tablets (40 mg total) by mouth daily with breakfast for 5 days. -     cephALEXin (KEFLEX) 500 MG capsule; Take 1 capsule (500 mg total) by  mouth 3 (three) times daily for 7 days.  Dyspnea, unspecified type -     Ambulatory referral to Pulmonology    Patient Instructions       IF you received an x-ray today, you will receive an invoice from [facility]. Please contact [facility] at [phone] with questions or concerns regarding your invoice.   IF you received labwork today, you will receive an invoice from [facility]. Please contact [facility] at [phone] with questions or concerns regarding your invoice.   Our billing staff will not be able to assist you with questions regarding bills from these companies.  You will be contacted with the lab results as soon as they are available. The fastest way to get your results is to activate your My Chart account. Instructions are located on the last page of this paperwork. If you have not heard from [place] regarding the results in 2 weeks, please contact this office.      Bee, Wasp, or [employer], Adult Bees, wasps, and hornets are part of a family of insects that can sting people. These stings can cause pain and inflammation, but they are usually not serious. However, some people may have an allergic reaction to a sting. This can cause the symptoms to be more severe. What increases the risk? You may be at a  greater risk of getting stung if you:  Provoke a stinging insect by swatting or disturbing it.  Wear strong-smelling soaps, deodorants, or body sprays.  Spend time outdoors near gardens with flowers or fruit trees or in clothes that expose skin.  Eat or drink outside.  What are the signs or symptoms? Common symptoms of this condition include:  A red lump in the skin that sometimes has a tiny hole in the center. In some cases, a stinger may be in the center of the wound.  Pain and itching at the sting site.  Redness and swelling around the sting site. If you have an allergic reaction (localized allergic reaction), the swelling and redness may spread out from the sting site. In some cases, this reaction can continue to develop over the next 24-48 hours.  In rare cases, a person may have a severe allergic reaction (anaphylactic reaction) to a sting. Symptoms of an anaphylactic reaction may include:  Wheezing or difficulty breathing.  Raised, itchy, red patches on the skin (hives).  Nausea or vomiting.  Abdominal cramping.  Diarrhea.  Tightness in the chest or chest pain.  Dizziness or fainting.  Redness of the face (flushing).  Hoarse voice.  Swollen tongue, lips, or face.  How is this diagnosed? This condition is usually diagnosed based on your symptoms and medical history as well as a physical exam. You may have an allergy test to determine if you are allergic to the substance that the insect injected during the sting (venom). How is this treated? If you were stung by a bee, the stinger and a small sac of venom may be in the wound. It is important to remove the stinger as soon as possible. You can do this by brushing across the wound with gauze, a fingernail, or a flat card such as a credit card. Removing the stinger can help reduce the severity of your body's reaction to the sting. Most stings can be treated with:  Icing to reduce swelling in the area.  Medicines  (antihistamines) to treat itching or an allergic reaction.  Medicines to help reduce pain. These may be medicines that you take by mouth,  or medicated creams or lotions that you apply to your skin.  Pay close attention to your symptoms after you have been stung. If possible, have someone stay with you to make sure you do not have an allergic reaction. If you have any signs of an allergic reaction, call your health care provider. If you have ever had a severe allergic reaction, your health care provider may give you an inhaler or injectable medicine (epinephrine auto-injector) to use if necessary. Follow these instructions at home:  Wash the sting site 2-3 times each day with soap and water as told by your health care provider.  Apply or take over-the-counter and prescription medicines only as told by your health care provider.  If directed, apply ice to the sting area. ? Put ice in a plastic bag. ? Place a towel between your skin and the bag. ? Leave the ice on for 20 minutes, 2-3 times a day.  Do not scratch the sting area.  If you had a severe allergic reaction to a sting, you may need: ? To wear a medical bracelet or necklace that lists the allergy. ? To learn when and how to use an anaphylaxis kit or epinephrine injection. Your family members and coworkers may also need to learn this. ? To carry an anaphylaxis kit or epinephrine injection with you at all times. How is this prevented?  Avoid swatting at stinging insects and disturbing insect nests.  Do not use fragrant soaps or lotions.  Wear shoes, pants, and long sleeves when spending time outdoors, especially in grassy areas where stinging insects are common.  Keep outdoor areas free from nests or hives.  Keep food and drink containers covered when eating outdoors.  Avoid working or sitting near [employer], if possible.  Wear gloves if you are gardening or working outdoors.  If an attack by a stinging insect or a  swarm seems likely in the moment, move away from the area or find a barrier between you and the insect(s), such as a door. Contact a health care provider if:  Your symptoms do not get better in 2-3 days.  You have redness, swelling, or pain that spreads beyond the area of the sting.  You have a fever. Get help right away if: You have symptoms of a severe allergic reaction. These include:  Wheezing or difficulty breathing.  Tightness in the chest or chest pain.  Light-headedness or fainting.  Itchy, raised, red patches on the skin.  Nausea or vomiting.  Abdominal cramping.  Diarrhea.  A swollen tongue or lips, or trouble swallowing.  Dizziness or fainting.  Summary  Stings from bees, wasps, and hornets can cause pain and inflammation, but they are usually not serious. However, some people may have an allergic reaction to a sting. This can cause the symptoms to be more severe.  Pay close attention to your symptoms after you have been stung. If possible, have someone stay with you to make sure you do not have an allergic reaction.  Call your health care provider if you have any signs of an allergic reaction. This information is not intended to replace advice given to you by your health care provider. Make sure you discuss any questions you have with your health care provider. Document Released: 07/29/2005 Document Revised: 10/03/2016 Document Reviewed: 10/03/2016 Elsevier Interactive Patient Education  2018 Elsevier Inc.      Agustina Caroli, MD [facility]

## 2017-12-17 NOTE — Patient Instructions (Addendum)
   IF you received an x-ray today, you will receive an invoice from [facility]. Please contact [facility] at [phone] with questions or concerns regarding your invoice.   IF you received labwork today, you will receive an invoice from [facility]. Please contact [facility] at [phone] with questions or concerns regarding your invoice.   Our billing staff will not be able to assist you with questions regarding bills from these companies.  You will be contacted with the lab results as soon as they are available. The fastest way to get your results is to activate your My Chart account. Instructions are located on the last page of this paperwork. If you have not heard from us regarding the results in 2 weeks, please contact this office.      Bee, Wasp, or Hornet Sting, Adult Bees, wasps, and hornets are part of a family of insects that can sting people. These stings can cause pain and inflammation, but they are usually not serious. However, some people may have an allergic reaction to a sting. This can cause the symptoms to be more severe. What increases the risk? You may be at a greater risk of getting stung if you:  Provoke a stinging insect by swatting or disturbing it.  Wear strong-smelling soaps, deodorants, or body sprays.  Spend time outdoors near gardens with flowers or fruit trees or in clothes that expose skin.  Eat or drink outside.  What are the signs or symptoms? Common symptoms of this condition include:  A red lump in the skin that sometimes has a tiny hole in the center. In some cases, a stinger may be in the center of the wound.  Pain and itching at the sting site.  Redness and swelling around the sting site. If you have an allergic reaction (localized allergic reaction), the swelling and redness may spread out from the sting site. In some cases, this reaction can continue to develop over the next 24-48 hours.  In rare cases, a person may have  a severe allergic reaction (anaphylactic reaction) to a sting. Symptoms of an anaphylactic reaction may include:  Wheezing or difficulty breathing.  Raised, itchy, red patches on the skin (hives).  Nausea or vomiting.  Abdominal cramping.  Diarrhea.  Tightness in the chest or chest pain.  Dizziness or fainting.  Redness of the face (flushing).  Hoarse voice.  Swollen tongue, lips, or face.  How is this diagnosed? This condition is usually diagnosed based on your symptoms and medical history as well as a physical exam. You may have an allergy test to determine if you are allergic to the substance that the insect injected during the sting (venom). How is this treated? If you were stung by a bee, the stinger and a small sac of venom may be in the wound. It is important to remove the stinger as soon as possible. You can do this by brushing across the wound with gauze, a fingernail, or a flat card such as a credit card. Removing the stinger can help reduce the severity of your body's reaction to the sting. Most stings can be treated with:  Icing to reduce swelling in the area.  Medicines (antihistamines) to treat itching or an allergic reaction.  Medicines to help reduce pain. These may be medicines that you take by mouth, or medicated creams or lotions that you apply to your skin.  Pay close attention to your symptoms after you have been stung. If possible, have someone stay with you   to make sure you do not have an allergic reaction. If you have any signs of an allergic reaction, call your health care provider. If you have ever had a severe allergic reaction, your health care provider may give you an inhaler or injectable medicine (epinephrine auto-injector) to use if necessary. Follow these instructions at home:  Wash the sting site 2-3 times each day with soap and water as told by your health care provider.  Apply or take over-the-counter and prescription medicines only as told  by your health care provider.  If directed, apply ice to the sting area. ? Put ice in a plastic bag. ? Place a towel between your skin and the bag. ? Leave the ice on for 20 minutes, 2-3 times a day.  Do not scratch the sting area.  If you had a severe allergic reaction to a sting, you may need: ? To wear a medical bracelet or necklace that lists the allergy. ? To learn when and how to use an anaphylaxis kit or epinephrine injection. Your family members and coworkers may also need to learn this. ? To carry an anaphylaxis kit or epinephrine injection with you at all times. How is this prevented?  Avoid swatting at stinging insects and disturbing insect nests.  Do not use fragrant soaps or lotions.  Wear shoes, pants, and long sleeves when spending time outdoors, especially in grassy areas where stinging insects are common.  Keep outdoor areas free from nests or hives.  Keep food and drink containers covered when eating outdoors.  Avoid working or sitting near flowering plants, if possible.  Wear gloves if you are gardening or working outdoors.  If an attack by a stinging insect or a swarm seems likely in the moment, move away from the area or find a barrier between you and the insect(s), such as a door. Contact a health care provider if:  Your symptoms do not get better in 2-3 days.  You have redness, swelling, or pain that spreads beyond the area of the sting.  You have a fever. Get help right away if: You have symptoms of a severe allergic reaction. These include:  Wheezing or difficulty breathing.  Tightness in the chest or chest pain.  Light-headedness or fainting.  Itchy, raised, red patches on the skin.  Nausea or vomiting.  Abdominal cramping.  Diarrhea.  A swollen tongue or lips, or trouble swallowing.  Dizziness or fainting.  Summary  Stings from bees, wasps, and hornets can cause pain and inflammation, but they are usually not serious. However,  some people may have an allergic reaction to a sting. This can cause the symptoms to be more severe.  Pay close attention to your symptoms after you have been stung. If possible, have someone stay with you to make sure you do not have an allergic reaction.  Call your health care provider if you have any signs of an allergic reaction. This information is not intended to replace advice given to you by your health care provider. Make sure you discuss any questions you have with your health care provider. Document Released: 07/29/2005 Document Revised: 10/03/2016 Document Reviewed: 10/03/2016 Elsevier Interactive Patient Education  2018 Elsevier Inc.  

## 2017-12-18 NOTE — Telephone Encounter (Signed)
Have him come see me so that we can talk about it, get labs and set up a plan.

## 2017-12-19 NOTE — Telephone Encounter (Signed)
Appointment booked

## 2017-12-22 ENCOUNTER — Ambulatory Visit: Payer: BLUE CROSS/BLUE SHIELD | Admitting: Sports Medicine

## 2017-12-22 ENCOUNTER — Encounter: Payer: Self-pay | Admitting: Sports Medicine

## 2017-12-22 DIAGNOSIS — M6580 Other synovitis and tenosynovitis, unspecified site: Secondary | ICD-10-CM | POA: Diagnosis not present

## 2017-12-22 DIAGNOSIS — N281 Cyst of kidney, acquired: Secondary | ICD-10-CM

## 2017-12-22 DIAGNOSIS — Z0000 Encounter for general adult medical examination without abnormal findings: Secondary | ICD-10-CM

## 2017-12-22 DIAGNOSIS — E291 Testicular hypofunction: Secondary | ICD-10-CM | POA: Diagnosis not present

## 2017-12-22 DIAGNOSIS — E079 Disorder of thyroid, unspecified: Secondary | ICD-10-CM

## 2017-12-22 DIAGNOSIS — M119 Crystal arthropathy, unspecified: Secondary | ICD-10-CM

## 2017-12-22 MED ORDER — TESTOSTERONE CYPIONATE 200 MG/ML IM SOLN: 150.0000 mg | INTRAMUSCULAR | 0 refills | Status: DC

## 2017-12-22 NOTE — Assessment & Plan Note (Signed)
Checking lipids

## 2017-12-22 NOTE — Progress Notes (Signed)
Subjective:    CC: Follow-up  HPI: Jeremiah Webb has been MIA for some time now, he is transitioning care back here from another provider.  Needs a refill on testosterone and would like to do it once a week.  In addition he is due for some other routine labs.  I reviewed the past medical history, family history, social history, surgical history, and allergies today and no changes were needed.  Please see the problem list section below in epic for further details.  Past Medical History: Past Medical History:  Diagnosis Date  . Hypothyroidism   . Sleep apnea    no cpap  . Thyroid disease    Hypothyroid   Past Surgical History: Past Surgical History:  Procedure Laterality Date  . CERVICAL DISC SURGERY     #5  . POSTERIOR CERVICAL LAMINECTOMY WITH MET- RX Left 09/20/2014   Procedure: Left C7-T1 "Sitting" Microdiskectomy;  Surgeon: Kristeen Miss, MD;  Location: [facility];  Service: Neurosurgery;  Laterality: Left;  Left C7-T1 "Sitting" Microdiskectomy  . QUADRICEPS TENDON REPAIR    . SHOULDER SURGERY     Social History: Social History   Socioeconomic History  . Marital status: Single    Spouse name: Not on file  . Number of children: Not on file  . Years of education: Not on file  . Highest education level: Not on file  Occupational History  . Not on file  Social Needs  . Financial resource strain: Not on file  . Food insecurity:    Worry: Not on file    Inability: Not on file  . Transportation needs:    Medical: Not on file    Non-medical: Not on file  Tobacco Use  . Smoking status: Never Smoker  . Smokeless tobacco: Never Used  Substance and Sexual Activity  . Alcohol use: Yes    Alcohol/week: 0.0 oz    Comment: occasional  . Drug use: No  . Sexual activity: Never  Lifestyle  . Physical activity:    Days per week: Not on file    Minutes per session: Not on file  . Stress: Not on file  Relationships  . Social connections:    Talks on phone: Not on file   Gets together: Not on file    Attends religious service: Not on file    Active member of club or organization: Not on file    Attends meetings of clubs or organizations: Not on file    Relationship status: Not on file  Other Topics Concern  . Not on file  Social History Narrative  . Not on file   Family History: Family History  Problem Relation Age of Onset  . Heart disease Mother   . Stroke Mother   . Diabetes Father   . Colon cancer Maternal Grandfather   . Esophageal cancer Neg Hx   . Rectal cancer Neg Hx   . Stomach cancer Neg Hx    Allergies: Allergies  Allergen Reactions  . Shrimp [Shellfish Allergy]     Only shrimp causes indigestion. No epi-pen necessary.   Medications: See med rec.  Review of Systems: No fevers, chills, night sweats, weight loss, chest pain, or shortness of breath.   Objective:    General: Well Developed, well nourished, and in no acute distress.  Neuro: Alert and oriented x3, extra-ocular muscles intact, sensation grossly intact.  HEENT: Normocephalic, atraumatic, pupils equal round reactive to light, neck supple, no masses, no lymphadenopathy, thyroid nonpalpable.  Skin: Warm and  dry, no rashes. Cardiac: Regular rate and rhythm, no murmurs rubs or gallops, no lower extremity edema.  Respiratory: Clear to auscultation bilaterally. Not using accessory muscles, speaking in full sentences.  Impression and Recommendations:    Male hypogonadism Taking over testosterone supplementation. He does desire to go to weekly injections, he will do 0.75 mL injected weekly, levels can be checked approximately 3 days after his injection.   Crystalline arthropathy with gout and pseudogout Rechecking uric acid levels. Has been resistant to Uloric in the past.  Thyroid disease Rechecking TSH  Annual physical exam Checking lipids  I spent 25 minutes with this patient, greater than 50% was face-to-face time counseling regarding the above  diagnoses ___________________________________________ Gwen Her. Dianah Field, M.D., ABFM., CAQSM. Primary Care and [place] Instructor of [place] of [facility]

## 2017-12-22 NOTE — Assessment & Plan Note (Signed)
Rechecking uric acid levels. Has been resistant to Uloric in the past.

## 2017-12-22 NOTE — Assessment & Plan Note (Signed)
Taking over testosterone supplementation. He does desire to go to weekly injections, he will do 0.75 mL injected weekly, levels can be checked approximately 3 days after his injection.

## 2017-12-22 NOTE — Assessment & Plan Note (Signed)
Rechecking TSH 

## 2017-12-31 DIAGNOSIS — M1711 Unilateral primary osteoarthritis, right knee: Secondary | ICD-10-CM | POA: Diagnosis not present

## 2018-01-05 ENCOUNTER — Encounter: Payer: Self-pay | Admitting: Family Medicine

## 2018-01-21 ENCOUNTER — Other Ambulatory Visit: Payer: Self-pay | Admitting: Emergency Medicine

## 2018-01-25 ENCOUNTER — Other Ambulatory Visit: Payer: Self-pay | Admitting: Emergency Medicine

## 2018-01-29 ENCOUNTER — Institutional Professional Consult (permissible substitution): Payer: Self-pay | Admitting: Internal Medicine

## 2018-01-30 ENCOUNTER — Ambulatory Visit: Payer: BLUE CROSS/BLUE SHIELD | Admitting: Internal Medicine

## 2018-01-30 ENCOUNTER — Other Ambulatory Visit (INDEPENDENT_AMBULATORY_CARE_PROVIDER_SITE_OTHER): Payer: BLUE CROSS/BLUE SHIELD

## 2018-01-30 ENCOUNTER — Encounter: Payer: Self-pay | Admitting: Internal Medicine

## 2018-01-30 VITALS — BP 134/84 | HR 83 | Ht 75.0 in | Wt 240.0 lb

## 2018-01-30 DIAGNOSIS — R05 Cough: Secondary | ICD-10-CM

## 2018-01-30 DIAGNOSIS — R058 Other specified cough: Secondary | ICD-10-CM | POA: Insufficient documentation

## 2018-01-30 LAB — CBC WITH DIFFERENTIAL/PLATELET
BASOS ABS: 0.1 10*3/uL (ref 0.0–0.1)
BASOS PCT: 1.7 % (ref 0.0–3.0)
EOS ABS: 0.5 10*3/uL (ref 0.0–0.7)
Eosinophils Relative: 7.9 % — ABNORMAL HIGH (ref 0.0–5.0)
HCT: 52.6 % — ABNORMAL HIGH (ref 39.0–52.0)
Hemoglobin: 17.6 g/dL — ABNORMAL HIGH (ref 13.0–17.0)
Lymphocytes Relative: 18.4 % (ref 12.0–46.0)
Lymphs Abs: 1.2 10*3/uL (ref 0.7–4.0)
MCHC: 33.4 g/dL (ref 30.0–36.0)
MCV: 90.5 fl (ref 78.0–100.0)
MONO ABS: 0.6 10*3/uL (ref 0.1–1.0)
Monocytes Relative: 9.9 % (ref 3.0–12.0)
NEUTROS ABS: 4 10*3/uL (ref 1.4–7.7)
NEUTROS PCT: 62.1 % (ref 43.0–77.0)
PLATELETS: 327 10*3/uL (ref 150.0–400.0)
RBC: 5.81 Mil/uL (ref 4.22–5.81)
RDW: 13.3 % (ref 11.5–15.5)
WBC: 6.4 10*3/uL (ref 4.0–10.5)

## 2018-01-30 MED ORDER — FAMOTIDINE 20 MG PO TABS: ORAL_TABLET | ORAL | 11 refills | Status: DC

## 2018-01-30 MED ORDER — PANTOPRAZOLE SODIUM 40 MG PO TBEC: 40.0000 mg | DELAYED_RELEASE_TABLET | Freq: Every day | ORAL | 2 refills | Status: DC

## 2018-01-30 NOTE — Patient Instructions (Addendum)
Pantoprazole (protonix) 40 mg   Take  30-60 min before first meal of the day and Pepcid (famotidine)  20 mg one @  bedtime until return to office - this is the best way to tell whether stomach acid is contributing to your problem.    GERD (REFLUX)  is an extremely common cause of respiratory symptoms just like yours , many times with no obvious heartburn at all.    It can be treated with medication, but also with lifestyle changes including elevation of the head of your bed (ideally with 6 inch  bed blocks),  Smoking cessation, avoidance of late meals, excessive alcohol, and avoid fatty foods, chocolate, peppermint, colas, red wine, and acidic juices such as orange juice.  NO MINT OR MENTHOL PRODUCTS SO NO COUGH DROPS   USE SUGARLESS CANDY INSTEAD (Jolley ranchers or Stover's or Life Savers) or even ice chips will also do - the key is to swallow to prevent all throat clearing. NO OIL BASED VITAMINS - use powdered substitutes.    Please remember to go to the lab and x-ray department downstairs in the basement  for your tests - we will call you with the results when they are available.     Please schedule a follow up office visit in 4 weeks, sooner if needed with pfts on return - note did not go for cxr

## 2018-01-30 NOTE — Progress Notes (Signed)
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 Pulmonary Consult    Referred by Dr. Alvy Webb.  Pt c/o SOB off and on "for a good while".  He states that when he eats or drinks something warm is makes him start coughing.   cc sudden onset loses breath  X 3 y prior to OV  can last from 5-15 min sev times a day sev days a week but then resolves spontaneously for up  to a week with no impact at all on ex tol nor increase in palpitations assoc   During the same time noted the steaming liquids making him cough> dry hacking with urge to clear throat s overt nasal complaints, wheeze or noct complaints   NO  obvious day to day or daytime variability or assoc excess/ purulent sputum or mucus plugs or hemoptysis or cp or chest tightness, subjective wheeze or overt sinus or hb symptoms. No unusual exposure hx or h/o childhood pna/ asthma or knowledge of premature birth.  Sleeping ok flat without nocturnal  or early am exacerbation  of respiratory  c/o's or need for noct saba. Also denies any obvious fluctuation of symptoms with weather or environmental changes or other aggravating or alleviating factors except as outlined above   Current Allergies, Complete Past Medical History, Past Surgical History, Family History, and Social History were reviewed in [employer] record.  ROS  The following  are not active complaints unless bolded Hoarseness, sore throat, dysphagia, dental problems, itching, sneezing,  nasal congestion or discharge of excess mucus or purulent secretions, ear ache,   fever, chills, sweats, unintended wt loss or wt gain, classically pleuritic or exertional cp,  orthopnea pnd or leg swelling, presyncope, palpitations, abdominal pain, anorexia, nausea, vomiting, diarrhea  or change in bowel habits or change in bladder habits, change in stools or change in urine, dysuria, hematuria,  rash, arthralgias, visual complaints, headache, numbness, weakness or ataxia or problems with walking or coordination,  change in mood/affect or memory.        Current Meds  Medication Sig  . levothyroxine (SYNTHROID, LEVOTHROID) 150 MCG tablet TAKE 1 TABLET BY MOUTH ONCE DAILY ** NEEDS OFFICE VISIT FOR ADDDITIONAL REFILLS**  . nabumetone (RELAFEN) 500 MG tablet Take 500 mg by mouth 2 (two) times daily as needed.  . testosterone cypionate (DEPOTESTOSTERONE CYPIONATE) 200 MG/ML injection Inject 0.75 mLs (150 mg total) into the muscle every 7 (seven) days. Pls include 18g needle for drawing up, 23g needle for injection.          Review of Systems     Objective:   Physical Exam    pleasant bm nad/ occ throat clearing   Wt Readings from Last 3 Encounters:  01/30/18 240 lb (108.9 kg)  12/22/17 246 lb (111.6 kg)  12/17/17 247 lb (112 kg)  Vital signs reviewed - Note on arrival 02 sats  97% on RA     HEENT: nl dentition, turbinates bilaterally, and oropharynx. Nl external ear canals without cough reflex   NECK :  without JVD/Nodes/TM/ nl carotid upstrokes bilaterally   LUNGS: no acc muscle use,  Nl contour chest which is clear to A and P bilaterally without cough on insp or exp maneuvers   CV:  RRR  no s3 or murmur or increase in P2, and no edema   ABD:  soft and nontender with nl inspiratory excursion in the supine position. No bruits or organomegaly appreciated, bowel  sounds nl  MS:  Nl gait/ ext warm without deformities, calf tenderness, cyanosis or clubbing No obvious joint restrictions   SKIN: warm and dry without lesions    NEURO:  alert, approp, nl sensorium with  no motor or cerebellar deficits apparent.     I personally reviewed images and agree with radiology impression as follows:   Chest CT  10/23/16 wnl   CXR PA and Lateral:   01/30/2018 :   Did not go as rec   Labs ordered 01/30/2018  Allergy profile      Assessment:

## 2018-01-31 ENCOUNTER — Encounter: Payer: Self-pay | Admitting: Internal Medicine

## 2018-01-31 NOTE — Assessment & Plan Note (Signed)
Allergy profile 01/30/2018 >  Eos 0.5 /  IgE pending   The throat clearing and intol to all steamed liquids with lack of nocturnal sympotms are strongly indicative of Upper airway cough syndrome (previously labeled PNDS),  is so named because it's frequently impossible to sort out how much is  CR/sinusitis with freq throat clearing (which can be related to primary GERD)   vs  causing  secondary (" extra esophageal")  GERD from wide swings in gastric pressure that occur with throat clearing, often  promoting self use of mint and menthol lozenges that reduce the lower esophageal sphincter tone and exacerbate the problem further in a cyclical fashion.   These are the same pts (now being labeled as having "irritable larynx syndrome" by some cough centers) who not infrequently have a history of having failed to tolerate ace inhibitors,  dry powder inhalers or biphosphonates or report having atypical/extraesophageal reflux symptoms that don't respond to standard doses of PPI  and are easily confused as having aecopd or asthma flares by even experienced allergists/ pulmonologists (myself included).    rec max rx for gerd x one month then return to regroup and ? Do MCT if dx remains in doubt   Total time devoted to counseling  > 50 % of initial 60 min office visit:  review case with pt/ discussion of options/alternatives/ personally creating written customized instructions  in presence of pt  then going over those specific  Instructions directly with the pt including how to use all of the meds but in particular covering each new medication in detail and the difference between the maintenance= "automatic" meds and the prns using an action plan format for the latter (If this problem/symptom => do that organization reading Left to right).  Please see AVS from this visit for a full list of these instructions which I personally wrote for this pt and  are unique to this visit.

## 2018-02-02 LAB — RESPIRATORY ALLERGY PROFILE REGION II ~~LOC~~
ALLERGEN, D PTERNOYSSINUS, D1: 0.5 kU/L — AB
ALLERGEN, OAK, T7: 0.75 kU/L — AB
ASPERGILLUS FUMIGATUS M3: 0.11 kU/L — AB
Allergen, A. alternata, m6: <0.1 kU/L
Allergen, Cedar tree, t12: 0.55 kU/L — ABNORMAL HIGH
Allergen, Comm Silver Birch, t9: 0.86 kU/L — ABNORMAL HIGH
Allergen, Cottonwood, t14: 1.07 kU/L — ABNORMAL HIGH
Allergen, Mouse Urine Protein, e78: <0.1 kU/L
Allergen, Mulberry, t76: 0.11 kU/L — ABNORMAL HIGH
BERMUDA GRASS: 4.95 kU/L — AB
Box Elder IgE: 0.61 kU/L — ABNORMAL HIGH
CLASS: 0
CLASS: 0
CLASS: 1
CLASS: 1
CLASS: 1
CLASS: 1
CLASS: 2
CLASS: 2
CLASS: 3
COCKROACH: 9.84 kU/L — AB
COMMON RAGWEED (SHORT) (W1) IGE: 1.04 kU/L — AB
Cat Dander: 0.11 kU/L — ABNORMAL HIGH
Class: 0
Class: 0
Class: 0
Class: 0
Class: 0
Class: 0
Class: 1
Class: 1
Class: 2
Class: 2
Class: 2
Class: 2
Class: 3
Class: 3
Class: 4
D. FARINAE: 0.7 kU/L — AB
Dog Dander: 0.23 kU/L — ABNORMAL HIGH
ELM IGE: 1.44 kU/L — AB
IgE (Immunoglobulin E), Serum: 1092 kU/L — ABNORMAL HIGH (ref <=114)
Johnson Grass: 10.6 kU/L — ABNORMAL HIGH
Pecan/Hickory Tree IgE: 0.61 kU/L — ABNORMAL HIGH
ROUGH PIGWEED IGE: 0.5 kU/L — AB
SHEEP SORREL IGE: 0.4 kU/L — AB
Timothy Grass: 28.9 kU/L — ABNORMAL HIGH

## 2018-02-02 LAB — INTERPRETATION:

## 2018-02-03 NOTE — Progress Notes (Signed)
Spoke with pt and notified of results per Dr. Wert. Pt verbalized understanding and denied any questions. 

## 2018-02-05 DIAGNOSIS — N281 Cyst of kidney, acquired: Secondary | ICD-10-CM | POA: Diagnosis not present

## 2018-02-05 DIAGNOSIS — E079 Disorder of thyroid, unspecified: Secondary | ICD-10-CM | POA: Diagnosis not present

## 2018-02-05 DIAGNOSIS — E291 Testicular hypofunction: Secondary | ICD-10-CM | POA: Diagnosis not present

## 2018-02-05 DIAGNOSIS — M119 Crystal arthropathy, unspecified: Secondary | ICD-10-CM | POA: Diagnosis not present

## 2018-02-05 DIAGNOSIS — Z125 Encounter for screening for malignant neoplasm of prostate: Secondary | ICD-10-CM | POA: Diagnosis not present

## 2018-02-05 DIAGNOSIS — M6580 Other synovitis and tenosynovitis, unspecified site: Secondary | ICD-10-CM | POA: Diagnosis not present

## 2018-02-06 LAB — PSA, TOTAL AND FREE
PSA, % Free: 20 % (calc) — ABNORMAL LOW (ref >25)
PSA, Free: 0.3 ng/mL
PSA, Total: 1.5 ng/mL (ref <=4.0)

## 2018-02-09 LAB — COMPREHENSIVE METABOLIC PANEL
AG Ratio: 1.6 (calc) (ref 1.0–2.5)
Albumin: 3.9 g/dL (ref 3.6–5.1)
BUN/Creatinine Ratio: 9 (calc) (ref 6–22)
CO2: 23 mmol/L (ref 20–32)
Chloride: 106 mmol/L (ref 98–110)
Creat: 1.37 mg/dL — ABNORMAL HIGH (ref 0.70–1.33)
Globulin: 2.5 g/dL (calc) (ref 1.9–3.7)
Glucose, Bld: 92 mg/dL (ref 65–99)
Potassium: 3.8 mmol/L (ref 3.5–5.3)
Sodium: 139 mmol/L (ref 135–146)

## 2018-02-09 LAB — CBC
HCT: 51.8 % — ABNORMAL HIGH (ref 38.5–50.0)
Hemoglobin: 17.2 g/dL — ABNORMAL HIGH (ref 13.2–17.1)
MCH: 29.9 pg (ref 27.0–33.0)
MCHC: 33.2 g/dL (ref 32.0–36.0)
MCV: 90.1 fL (ref 80.0–100.0)
MPV: 10.1 fL (ref 7.5–12.5)
Platelets: 308 10*3/uL (ref 140–400)
RBC: 5.75 10*6/uL (ref 4.20–5.80)
RDW: 12.1 % (ref 11.0–15.0)
WBC: 6.6 10*3/uL (ref 3.8–10.8)

## 2018-02-09 LAB — COMPREHENSIVE METABOLIC PANEL WITH GFR
ALT: 17 U/L (ref 9–46)
AST: 21 U/L (ref 10–35)
Alkaline phosphatase (APISO): 42 U/L (ref 40–115)
BUN: 13 mg/dL (ref 7–25)
Calcium: 8.8 mg/dL (ref 8.6–10.3)
Total Bilirubin: 0.8 mg/dL (ref 0.2–1.2)
Total Protein: 6.4 g/dL (ref 6.1–8.1)

## 2018-02-09 LAB — LIPID PANEL W/REFLEX DIRECT LDL
Cholesterol: 140 mg/dL (ref <200)
HDL: 31 mg/dL — ABNORMAL LOW (ref >40)
LDL Cholesterol (Calc): 92 mg/dL (calc)
Non-HDL Cholesterol (Calc): 109 mg/dL (calc) (ref <130)
Total CHOL/HDL Ratio: 4.5 (calc) (ref <5.0)
Triglycerides: 76 mg/dL (ref <150)

## 2018-02-09 LAB — HEMOGLOBIN A1C
Hgb A1c MFr Bld: 4.8 %{Hb} (ref <5.7)
Mean Plasma Glucose: 91 (calc)
eAG (mmol/L): 5 (calc)

## 2018-02-09 LAB — TESTOSTERONE, FREE & TOTAL
Free Testosterone: 230.7 pg/mL — ABNORMAL HIGH (ref 35.0–155.0)
Testosterone, Total, LC-MS-MS: 1073 ng/dL (ref 250–1100)

## 2018-02-09 LAB — URIC ACID: Uric Acid, Serum: 9.8 mg/dL — ABNORMAL HIGH (ref 4.0–8.0)

## 2018-02-09 LAB — TSH: TSH: 1.5 m[IU]/L (ref 0.40–4.50)

## 2018-02-20 ENCOUNTER — Ambulatory Visit (INDEPENDENT_AMBULATORY_CARE_PROVIDER_SITE_OTHER): Payer: BLUE CROSS/BLUE SHIELD

## 2018-02-20 ENCOUNTER — Ambulatory Visit: Payer: BLUE CROSS/BLUE SHIELD | Admitting: Urgent Care

## 2018-02-20 ENCOUNTER — Encounter: Payer: Self-pay | Admitting: Urgent Care

## 2018-02-20 VITALS — BP 145/85 | HR 92 | Temp 98.5°F | Resp 18 | Ht 75.0 in | Wt 239.2 lb

## 2018-02-20 DIAGNOSIS — M7989 Other specified soft tissue disorders: Secondary | ICD-10-CM

## 2018-02-20 DIAGNOSIS — M79672 Pain in left foot: Secondary | ICD-10-CM

## 2018-02-20 DIAGNOSIS — M1A0710 Idiopathic chronic gout, right ankle and foot, without tophus (tophi): Secondary | ICD-10-CM

## 2018-02-20 MED ORDER — PREDNISONE 20 MG PO TABS: 20.0000 mg | ORAL_TABLET | Freq: Every day | ORAL | 0 refills | Status: DC

## 2018-02-20 NOTE — Patient Instructions (Addendum)
Foot Pain Many things can cause foot pain. Some common causes are:  An injury.  A sprain.  Arthritis.  Blisters.  Bunions.  Follow these instructions at home: Pay attention to any changes in your symptoms. Take these actions to help with your discomfort:  If directed, put ice on the affected area: ? Put ice in a plastic bag. ? Place a towel between your skin and the bag. ? Leave the ice on for 15-20 minutes, 3?4 times a day for 2 days.  Take over-the-counter and prescription medicines only as told by your health care provider.  Wear comfortable, supportive shoes that fit you well. Do not wear high heels.  Do not stand or walk for long periods of time.  Do not lift a lot of weight. This can put added pressure on your feet.  Do stretches to relieve foot pain and stiffness as told by your health care provider.  Rub your foot gently.  Keep your feet clean and dry.  Contact a health care provider if:  Your pain does not get better after a few days of self-care.  Your pain gets worse.  You cannot stand on your foot. Get help right away if:  Your foot is numb or tingling.  Your foot or toes are swollen.  Your foot or toes turn white or blue.  You have warmth and redness along your foot. This information is not intended to replace advice given to you by your health care provider. Make sure you discuss any questions you have with your health care provider. Document Released: 08/25/2015 Document Revised: 01/04/2016 Document Reviewed: 08/24/2014 Elsevier Interactive Patient Education  2018 [employer].     IF you received an x-ray today, you will receive an invoice from [facility]. Please contact [facility] at [phone] with questions or concerns regarding your invoice.   IF you received labwork today, you will receive an invoice from [facility]. Please contact [facility] at [phone] with questions or concerns regarding your invoice.   Our  billing staff will not be able to assist you with questions regarding bills from these companies.  You will be contacted with the lab results as soon as they are available. The fastest way to get your results is to activate your My Chart account. Instructions are located on the last page of this paperwork. If you have not heard from [place] regarding the results in 2 weeks, please contact this office.

## 2018-02-20 NOTE — Progress Notes (Signed)
   MRN: [id number] DOB: January 31, 1961  Subjective:   Jeremiah Webb is a 57 y.o. male presenting for 2 day history of left foot pain, swelling. Patient drives commercial vehicle for a living. Has not tried medications for relief. Denies fever, trauma, falls. Typically, patient gets gout on his right ankle. Has used Colcrys successfully in the past for this, used Aleve as well to follow it. He did use Colcrys 4-5 days ago. Of note, patient reports that he was using stair master extensively day before his left foot pain started.  Jeremiah Webb has a current medication list which includes the following prescription(s): levothyroxine, nabumetone, and testosterone cypionate. Also has No Known Allergies.  Jeremiah Webb  has a past medical history of Hypothyroidism, Sleep apnea, and Thyroid disease. Also  has a past surgical history that includes Quadriceps tendon repair; Cervical disc surgery; Posterior cervical laminectomy with met- rx (Left, 09/20/2014); and Shoulder surgery.  Objective:   Vitals: BP (!) 145/85   Pulse 92   Temp 98.5 F (36.9 C) (Oral)   Resp 18   Ht _0  (1.905 m)   Wt 239 lb 3.2 oz (108.5 kg)   SpO2 98%   BMI 29.90 kg/m   Physical Exam  Constitutional: He is oriented to person, place, and time. He appears well-developed and well-nourished.  Cardiovascular: Normal rate.  Pulmonary/Chest: Effort normal.  Musculoskeletal:       Left foot: There is tenderness (over area depicted with trace swelling). There is normal range of motion, no bony tenderness, normal capillary refill, no crepitus, no deformity and no laceration.       Feet:  Neurological: He is alert and oriented to person, place, and time.   Dg Foot Complete Left  Result Date: 02/20/2018 CLINICAL DATA:  Left foot pain and swelling. EXAM: LEFT FOOT - COMPLETE 3+ VIEW COMPARISON:  None. FINDINGS: There is no evidence of fracture or dislocation. There is no evidence of arthropathy or other focal bone abnormality. Soft tissues are  unremarkable. IMPRESSION: Negative. Electronically Signed   By: Lajean Manes M.D.   On: 02/20/2018 14:35   Assessment and Plan :   Left foot pain - Plan: DG Foot Complete Left  Swelling of left foot - Plan: DG Foot Complete Left  Chronic gout of right ankle, unspecified cause  Patient is very skeptical his symptoms could be due to his gout.  I counseled that his gout level was at a 9.8 weeks ago and very well could be the source of his symptoms.  He was agreeable to taking prednisone to address general inflammation which I did admit was a possibility from overuse due to the stairmaster.  Counseled that he really should check back with his orthopedist if his foot pain persists.  We also discussed possibility of allopurinol which has been brought up to patient in the past.  Patient verbalized understanding and is in agreement with treatment plan.  Jaynee Eagles, PA-C Primary Care at [facility] [phone] 02/20/2018  2:16 PM

## 2018-02-21 ENCOUNTER — Telehealth: Payer: Self-pay | Admitting: Urgent Care

## 2018-02-21 NOTE — Telephone Encounter (Signed)
Patient called to request his preferred pharmacy as well as the prednisone script sent to [facility] be changed to [facility] on [address]

## 2018-02-22 ENCOUNTER — Other Ambulatory Visit: Payer: Self-pay

## 2018-02-22 MED ORDER — PREDNISONE 20 MG PO TABS: 20.0000 mg | ORAL_TABLET | Freq: Every day | ORAL | 0 refills | Status: DC

## 2018-02-22 NOTE — Telephone Encounter (Signed)
Prednisone resent to [facility] [address]

## 2018-03-06 ENCOUNTER — Ambulatory Visit (INDEPENDENT_AMBULATORY_CARE_PROVIDER_SITE_OTHER)
Admission: RE | Admit: 2018-03-06 | Discharge: 2018-03-06 | Disposition: A | Discharge status: Home / Self Care | Payer: BLUE CROSS/BLUE SHIELD | Source: Ambulatory Visit | Attending: Internal Medicine | Admitting: Internal Medicine

## 2018-03-06 ENCOUNTER — Ambulatory Visit: Payer: BLUE CROSS/BLUE SHIELD | Admitting: Internal Medicine

## 2018-03-06 ENCOUNTER — Ambulatory Visit (INDEPENDENT_AMBULATORY_CARE_PROVIDER_SITE_OTHER): Payer: BLUE CROSS/BLUE SHIELD | Admitting: Internal Medicine

## 2018-03-06 ENCOUNTER — Encounter: Payer: Self-pay | Admitting: Internal Medicine

## 2018-03-06 VITALS — BP 126/74 | HR 83 | Ht 74.2 in | Wt 238.0 lb

## 2018-03-06 DIAGNOSIS — R05 Cough: Secondary | ICD-10-CM

## 2018-03-06 DIAGNOSIS — R058 Other specified cough: Secondary | ICD-10-CM

## 2018-03-06 LAB — PULMONARY FUNCTION TEST
DL/VA % pred: 108 %
DL/VA: 5.25 ml/min/mmHg/L
DLCO COR: 30.51 ml/min/mmHg
DLCO cor % pred: 80 %
DLCO unc % pred: 85 %
DLCO unc: 32.53 ml/min/mmHg
FEF 25-75 PRE: 2.96 L/s
FEF 25-75 Post: 3.19 L/sec
FEF2575-%Change-Post: 7 %
FEF2575-%PRED-PRE: 83 %
FEF2575-%Pred-Post: 90 %
FEV1-%Change-Post: 3 %
FEV1-%Pred-Post: 88 %
FEV1-%Pred-Pre: 84 %
FEV1-POST: 3.31 L
FEV1-PRE: 3.18 L
FEV1FVC-%Change-Post: 3 %
FEV1FVC-%Pred-Pre: 100 %
FEV6-%CHANGE-POST: 0 %
FEV6-%PRED-PRE: 86 %
FEV6-%Pred-Post: 87 %
FEV6-POST: 4.03 L
FEV6-Pre: 4 L
FEV6FVC-%Change-Post: 0 %
FEV6FVC-%PRED-POST: 103 %
FEV6FVC-%PRED-PRE: 103 %
FVC-%Change-Post: 0 %
FVC-%PRED-POST: 84 %
FVC-%Pred-Pre: 83 %
FVC-POST: 4.03 L
FVC-Pre: 4 L
POST FEV6/FVC RATIO: 100 %
PRE FEV1/FVC RATIO: 80 %
Post FEV1/FVC ratio: 82 %
Pre FEV6/FVC Ratio: 100 %
RV % PRED: 66 %
RV: 1.57 L
TLC % PRED: 71 %
TLC: 5.6 L

## 2018-03-06 NOTE — Progress Notes (Signed)
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 Pulmonary Consult    Referred by Dr. Alvy Webb.  Pt c/o SOB off and on "for a good while".  He states that when he eats or drinks something warm is makes him start coughing.   cc sudden onset loses breath  X 3 y prior to OV  can last from 5-15 min sev times a day sev days a week but then resolves spontaneously for up  to a week with no impact at all on ex tol nor increase in palpitations assoc  During the same time noted the steaming liquids making him cough> dry hacking with urge to clear throat s overt nasal complaints, wheeze or noct complaints rec Pantoprazole (protonix) 40 mg   Take  30-60 min before first meal of the day and Pepcid (famotidine)  20 mg one @  bedtime until return to office - this is the best way to tell whether stomach acid is contributing to your problem.  GERD Please remember to go to the lab and x-ray department downstairs in the basement  for your tests - we will call you with the results when they are available.    03/06/2018  f/u ov/Jeremiah Webb re: atypical cough/sob Chief Complaint  Patient presents with  . Follow-up    PFT performed today. Pt states he is still coughing but states it is more like a SOB type cough.  Dyspnea:  Great ex tolerance / never reproducible with ex  Cough:  steamed liquids bring it on "so do my pvc's"   No obvious day to day or daytime variability or assoc excess/ purulent sputum or mucus plugs or hemoptysis or cp or chest tightness, subjective wheeze or overt sinus or hb symptoms.   Sleeping: fine flat  without nocturnal  or early am exacerbation  of respiratory  c/o's or need for noct saba. Also denies any obvious fluctuation of symptoms with weather or environmental changes or other aggravating or alleviating factors except as outlined above   No unusual exposure hx or h/o childhood pna/ asthma or knowledge of premature birth.  Current Allergies, Complete Past Medical History, Past Surgical History, Family History, and Social History were reviewed in [employer] record.  ROS  The following are not active complaints unless bolded Hoarseness, sore throat, dysphagia, dental problems, itching, sneezing,  nasal congestion or discharge of excess mucus or purulent secretions, ear ache,   fever, chills, sweats, unintended wt loss or wt gain, classically pleuritic or exertional cp,  orthopnea pnd or arm/hand swelling  or leg swelling, presyncope, palpitations, abdominal pain, anorexia, nausea, vomiting, diarrhea  or change in bowel habits or change in bladder habits, change in stools or change in urine, dysuria, hematuria,  rash, arthralgias, visual complaints,  headache, numbness, weakness or ataxia or problems with walking or coordination,  change in mood or  memory.        Current Meds  Medication Sig  . levothyroxine (SYNTHROID, LEVOTHROID) 150 MCG tablet TAKE 1 TABLET BY MOUTH ONCE DAILY ** NEEDS OFFICE VISIT FOR ADDDITIONAL REFILLS**  . nabumetone (RELAFEN) 500 MG tablet Take 500 mg by mouth 2 (two) times daily as needed.  . predniSONE (DELTASONE) 20 MG tablet Take 1 tablet (20 mg total) by mouth daily with breakfast.  . testosterone cypionate (DEPOTESTOSTERONE CYPIONATE) 200 MG/ML injection Inject 0.75 mLs (150 mg total) into  the muscle every 7 (seven) days. Pls include 18g needle for drawing up, 23g needle for injection.                         Objective:   Physical Exam  amb bm nad    03/06/2018       238   01/30/18 240 lb (108.9 kg)  12/22/17 246 lb (111.6 kg)  12/17/17 247 lb (112 kg)     Vital signs reviewed - Note on arrival 02 sats  96% on RA      03/06/2018  HEENT: nl dentition, turbinates bilaterally, and oropharynx. Nl external ear canals without cough reflex   NECK :  without JVD/Nodes/TM/ nl carotid upstrokes bilaterally   LUNGS: no acc muscle use,  Nl contour chest which is clear to A and P bilaterally without cough on insp or exp maneuvers   CV:  RRR  no s3 or murmur or increase in P2, and no edema   ABD:  soft and nontender with nl inspiratory excursion in the supine position. No bruits or organomegaly appreciated, bowel sounds nl  MS:  Nl gait/ ext warm without deformities, calf tenderness, cyanosis or clubbing No obvious joint restrictions   SKIN: warm and dry without lesions    NEURO:  alert, approp, nl sensorium with  no motor or cerebellar deficits apparent.      CXR PA and Lateral:   03/06/2018 :    I personally reviewed images and agree with radiology impression as follows:   No active cardiopulmonary disease.        Assessment:

## 2018-03-06 NOTE — Progress Notes (Signed)
PFT done today. 

## 2018-03-06 NOTE — Progress Notes (Signed)
LMTCB

## 2018-03-06 NOTE — Assessment & Plan Note (Addendum)
Allergy profile 01/30/2018 >  Eos 0.5 /  IgE  1092 RAST pan positive - pft's 03/06/2018 wnl  - Methacholine challenge while on gerd rx    I had an extended final summary discussion with the patient reviewing all relevant studies completed to date and  lasting 15 to 20 minutes of a 25 minute visit on the following issues:   1) pfts completely nl but do not rule out asthma > needs MCT to complete the w/u   2) gerd not likely as no better on rx / diet but should continue rx thru day of MCT to reduce risk of false pos  3) absence of symptoms during sleep and heavy  ex strongly against any lung problem and also reassuring re cardiac issues   4) Pos allergy tests place him at risk of symptoms but do not prove cause and effect with any of the   allergens suggested by the allergy screen eg he no longer has a dog x 2 years, symptoms don't change in middle of winter vs all spring   5) pulmonary f/u can be prn if MCT neg and symptoms do not flare off gerd rx    Each maintenance medication was reviewed in detail including most importantly the difference between maintenance and as needed and under what circumstances the prns are to be used.  Please see AVS for specific  Instructions which are unique to this visit and I personally typed out  which were reviewed in detail in writing with the patient and a copy provided.

## 2018-03-06 NOTE — Patient Instructions (Addendum)
Please see patient coordinator before you leave today  to schedule  Methacholine challenge test  Then stop the reflux medications   Please remember to go to the  x-ray department downstairs in the basement  for your tests - we will call you with the results when they are available.      pulmnary follow up is as needed

## 2018-03-07 ENCOUNTER — Encounter: Payer: Self-pay | Admitting: Internal Medicine

## 2018-03-09 DIAGNOSIS — M1711 Unilateral primary osteoarthritis, right knee: Secondary | ICD-10-CM | POA: Diagnosis not present

## 2018-03-09 NOTE — Progress Notes (Signed)
Spoke with pt and notified of results per Dr. Wert. Pt verbalized understanding and denied any questions. 

## 2018-03-12 ENCOUNTER — Encounter: Payer: Self-pay | Admitting: Internal Medicine

## 2018-03-12 ENCOUNTER — Other Ambulatory Visit: Payer: Self-pay | Admitting: Sports Medicine

## 2018-03-12 DIAGNOSIS — E291 Testicular hypofunction: Secondary | ICD-10-CM

## 2018-03-13 MED ORDER — TESTOSTERONE CYPIONATE 200 MG/ML IM SOLN: 150.0000 mg | INTRAMUSCULAR | 0 refills | Status: DC

## 2018-03-13 NOTE — Telephone Encounter (Signed)
Requesting RF on Testosterone.   Pt notes: "Dr. Karie Schwalbe can please [facility] [address] [facility] [place]. I left a VM last week. Never heard back from anyone. Thanks!"  RX pended

## 2018-03-16 ENCOUNTER — Ambulatory Visit (HOSPITAL_COMMUNITY)
Admission: RE | Admit: 2018-03-16 | Discharge: 2018-03-16 | Disposition: A | Discharge status: Home / Self Care | Payer: BLUE CROSS/BLUE SHIELD | Source: Ambulatory Visit | Attending: Internal Medicine | Admitting: Internal Medicine

## 2018-03-16 DIAGNOSIS — J449 Chronic obstructive pulmonary disease, unspecified: Secondary | ICD-10-CM | POA: Diagnosis not present

## 2018-03-16 DIAGNOSIS — R058 Other specified cough: Secondary | ICD-10-CM

## 2018-03-16 DIAGNOSIS — R05 Cough: Secondary | ICD-10-CM | POA: Diagnosis not present

## 2018-03-16 LAB — PULMONARY FUNCTION TEST
FEF 25-75 Post: 3.44 L/sec
FEF 25-75 Pre: 2.53 L/sec
FEF2575-%CHANGE-POST: 35 %
FEF2575-%PRED-PRE: 71 %
FEF2575-%Pred-Post: 97 %
FEV1-%CHANGE-POST: 9 %
FEV1-%Pred-Post: 86 %
FEV1-%Pred-Pre: 79 %
FEV1-Post: 3.26 L
FEV1-Pre: 2.97 L
FEV1FVC-%CHANGE-POST: -1 %
FEV1FVC-%Pred-Pre: 99 %
FEV6-%Change-Post: 10 %
FEV6-%Pred-Post: 91 %
FEV6-%Pred-Pre: 82 %
FEV6-POST: 4.21 L
FEV6-PRE: 3.8 L
FEV6FVC-%Change-Post: 0 %
FEV6FVC-%Pred-Post: 102 %
FEV6FVC-%Pred-Pre: 103 %
FVC-%Change-Post: 11 %
FVC-%PRED-POST: 89 %
FVC-%PRED-PRE: 80 %
FVC-POST: 4.24 L
FVC-PRE: 3.8 L
PRE FEV1/FVC RATIO: 78 %
PRE FEV6/FVC RATIO: 100 %
Post FEV1/FVC ratio: 77 %
Post FEV6/FVC ratio: 99 %

## 2018-03-16 MED ORDER — METHACHOLINE 0.0625 MG/ML NEB SOLN
2.0000 mL | Freq: Once | RESPIRATORY_TRACT | Status: AC
  Administered 2018-03-16: 0.125 mg via RESPIRATORY_TRACT
  Filled 2018-03-16: qty 2

## 2018-03-16 MED ORDER — METHACHOLINE 1 MG/ML NEB SOLN
2.0000 mL | Freq: Once | RESPIRATORY_TRACT | Status: AC
  Administered 2018-03-16: 2 mg via RESPIRATORY_TRACT
  Filled 2018-03-16: qty 2

## 2018-03-16 MED ORDER — SODIUM CHLORIDE 0.9 % IN NEBU
3.0000 mL | INHALATION_SOLUTION | Freq: Once | RESPIRATORY_TRACT | Status: AC
Start: 2018-03-16 — End: 2018-03-16
  Administered 2018-03-16: 3 mL via RESPIRATORY_TRACT
  Filled 2018-03-16: qty 3

## 2018-03-16 MED ORDER — METHACHOLINE 4 MG/ML NEB SOLN
2.0000 mL | Freq: Once | RESPIRATORY_TRACT | Status: AC
  Administered 2018-03-16: 8 mg via RESPIRATORY_TRACT
  Filled 2018-03-16: qty 2

## 2018-03-16 MED ORDER — METHACHOLINE 16 MG/ML NEB SOLN
2.0000 mL | Freq: Once | RESPIRATORY_TRACT | Status: AC
  Administered 2018-03-16: 32 mg via RESPIRATORY_TRACT
  Filled 2018-03-16: qty 2

## 2018-03-16 MED ORDER — ALBUTEROL SULFATE (2.5 MG/3ML) 0.083% IN NEBU
2.5000 mg | INHALATION_SOLUTION | Freq: Once | RESPIRATORY_TRACT | Status: AC
  Administered 2018-03-16: 2.5 mg via RESPIRATORY_TRACT

## 2018-03-16 MED ORDER — METHACHOLINE 0.25 MG/ML NEB SOLN
2.0000 mL | Freq: Once | RESPIRATORY_TRACT | Status: AC
  Administered 2018-03-16: 0.5 mg via RESPIRATORY_TRACT
  Filled 2018-03-16: qty 2

## 2018-03-17 NOTE — Progress Notes (Signed)
Spoke with pt and notified of results per Dr. Wert. Pt verbalized understanding and denied any questions. 

## 2018-03-26 ENCOUNTER — Ambulatory Visit: Payer: BLUE CROSS/BLUE SHIELD | Admitting: Family Medicine

## 2018-04-05 ENCOUNTER — Ambulatory Visit (HOSPITAL_COMMUNITY)
Admission: EM | Admit: 2018-04-05 | Discharge: 2018-04-05 | Disposition: A | Discharge status: Home / Self Care | Payer: BLUE CROSS/BLUE SHIELD | Attending: Internal Medicine | Admitting: Internal Medicine

## 2018-04-05 ENCOUNTER — Ambulatory Visit (INDEPENDENT_AMBULATORY_CARE_PROVIDER_SITE_OTHER): Payer: BLUE CROSS/BLUE SHIELD

## 2018-04-05 ENCOUNTER — Encounter (HOSPITAL_COMMUNITY): Payer: Self-pay | Admitting: *Deleted

## 2018-04-05 DIAGNOSIS — R2242 Localized swelling, mass and lump, left lower limb: Secondary | ICD-10-CM | POA: Diagnosis not present

## 2018-04-05 DIAGNOSIS — E039 Hypothyroidism, unspecified: Secondary | ICD-10-CM | POA: Insufficient documentation

## 2018-04-05 DIAGNOSIS — Z79890 Hormone replacement therapy (postmenopausal): Secondary | ICD-10-CM | POA: Insufficient documentation

## 2018-04-05 DIAGNOSIS — M7989 Other specified soft tissue disorders: Secondary | ICD-10-CM

## 2018-04-05 DIAGNOSIS — Z79899 Other long term (current) drug therapy: Secondary | ICD-10-CM | POA: Insufficient documentation

## 2018-04-05 DIAGNOSIS — M109 Gout, unspecified: Secondary | ICD-10-CM | POA: Insufficient documentation

## 2018-04-05 DIAGNOSIS — M79672 Pain in left foot: Secondary | ICD-10-CM

## 2018-04-05 LAB — URIC ACID: URIC ACID, SERUM: 8.4 mg/dL (ref 3.7–8.6)

## 2018-04-05 MED ORDER — PREDNISONE 50 MG PO TABS: 50.0000 mg | ORAL_TABLET | Freq: Every day | ORAL | 0 refills | Status: AC

## 2018-04-05 NOTE — ED Triage Notes (Signed)
Started with left dorsal foot pain 4 days ago while working out on [employer].  Pain has become significantly worse with swelling. Pt also requesting uric acid level to be checked.

## 2018-04-05 NOTE — Discharge Instructions (Signed)
No fracture or other abnormality seen on x-ray  Please begin prednisone daily for the next 5 days  Outpatient ultrasound-please go to [facility] of [facility], ask for vascular lab, please arrive prior to 8 AM

## 2018-04-06 NOTE — ED Provider Notes (Signed)
[place]    CSN: [id number] Arrival date & time: 04/05/18  1413     History   Chief Complaint No chief complaint on file.   HPI Jeremiah Webb is a 57 y.o. male history of hypothyroidism, gout/pseudogout presenting today for evaluation of left foot pain.  Patient states that over the past 4 days he has had worsening pain and swelling to his left foot.  He denies any specific injury, but did notice the pain beginning while he was using the stairmaster.  He states that he typically uses a Metallurgist, he is concerned about a stress fracture.  He is also wanting a uric acid level to be checked for possible gout.  He has had gout previously, but typically has it in his right ankle.  He never had it in his foot.  Has been walking with a limp.  He has been taking ibuprofen, Aleve and Relafen without relief.  Denies previous DVT/PE, denies history of smoking, denies recent travel or immobilization, surgery.  Denies chest pain shortness of breath.  HPI  Past Medical History:  Diagnosis Date  . Hypothyroidism   . Thyroid disease    Hypothyroid    Patient Active Problem List   Diagnosis Date Noted  . Upper airway cough syndrome 01/30/2018  . Annual physical exam 12/22/2017  . Thyroid disease 08/26/2017  . Acquired cyst of kidney 01/20/2017  . Right shoulder pain 06/24/2016  . Male hypogonadism 06/06/2016  . Infrapatellar bursitis of right knee 12/19/2015  . Crystalline arthropathy with gout and pseudogout 09/22/2015  . Palpitations 02/14/2015  . Degenerative disc disease, cervical 04/05/2014    Past Surgical History:  Procedure Laterality Date  . CERVICAL DISC SURGERY     #5  . POSTERIOR CERVICAL LAMINECTOMY WITH MET- RX Left 09/20/2014   Procedure: Left C7-T1 "Sitting" Microdiskectomy;  Surgeon: Kristeen Miss, MD;  Location: [facility];  Service: Neurosurgery;  Laterality: Left;  Left C7-T1 "Sitting" Microdiskectomy  . QUADRICEPS TENDON REPAIR    . SHOULDER  SURGERY         Home Medications    Prior to Admission medications   Medication Sig Start Date End Date Taking? Authorizing Provider  levothyroxine (SYNTHROID, LEVOTHROID) 150 MCG tablet TAKE 1 TABLET BY MOUTH ONCE DAILY ** NEEDS OFFICE VISIT FOR ADDDITIONAL REFILLS** 08/27/17  Yes Sagardia, Ines Bloomer, MD  nabumetone (RELAFEN) 500 MG tablet Take 500 mg by mouth 2 (two) times daily as needed.   Yes [provider]  predniSONE (DELTASONE) 50 MG tablet Take 1 tablet (50 mg total) by mouth daily with breakfast for 5 days. 04/05/18 04/10/18  Damyah Gugel C, PA-C  testosterone cypionate (DEPOTESTOSTERONE CYPIONATE) 200 MG/ML injection Inject 0.75 mLs (150 mg total) into the muscle every 7 (seven) days. Pls include 18g needle for drawing up, 23g needle for injection. 03/13/18   Silverio Decamp, MD    Family History Family History  Problem Relation Age of Onset  . Heart disease Mother   . Stroke Mother   . Diabetes Father   . Colon cancer Maternal Grandfather   . Esophageal cancer Neg Hx   . Rectal cancer Neg Hx   . Stomach cancer Neg Hx     Social History Social History   Tobacco Use  . Smoking status: Never Smoker  . Smokeless tobacco: Never Used  Substance Use Topics  . Alcohol use: Yes    Alcohol/week: 0.0 standard drinks    Comment: occasional  . Drug use: No  Allergies   Patient has no known allergies.   Review of Systems Review of Systems  Constitutional: Negative for fatigue and fever.  Eyes: Negative for redness, itching and visual disturbance.  Respiratory: Negative for shortness of breath.   Cardiovascular: Negative for chest pain and leg swelling.  Gastrointestinal: Negative for nausea and vomiting.  Musculoskeletal: Positive for arthralgias, gait problem, joint swelling and myalgias.  Skin: Positive for color change. Negative for rash and wound.  Neurological: Negative for dizziness, syncope, weakness, light-headedness and headaches.      Physical Exam Triage Vital Signs ED Triage Vitals  Enc Vitals Group     BP 04/05/18 1428 (!) 146/100     Pulse Rate 04/05/18 1428 87     Resp 04/05/18 1428 14     Temp 04/05/18 1427 98.8 F (37.1 C)     Temp Source 04/05/18 1427 Oral     SpO2 04/05/18 1428 96 %     Weight --      Height --      Head Circumference --      Peak Flow --      Pain Score 04/05/18 1429 10     Pain Loc --      Pain Edu? --      Excl. in [place]? --    No data found.  Updated Vital Signs BP (!) 146/100   Pulse 87   Temp 98.8 F (37.1 C) (Oral)   Resp 14   SpO2 96%   Visual Acuity Right Eye Distance:   Left Eye Distance:   Bilateral Distance:    Right Eye Near:   Left Eye Near:    Bilateral Near:     Physical Exam  Constitutional: He is oriented to person, place, and time. He appears well-developed and well-nourished.  No acute distress  HENT:  Head: Normocephalic and atraumatic.  Nose: Nose normal.  Eyes: Conjunctivae are normal.  Neck: Neck supple.  Cardiovascular: Normal rate.  Pulmonary/Chest: Effort normal and breath sounds normal. No respiratory distress.  Abdominal: He exhibits no distension.  Musculoskeletal: Normal range of motion.  Left  foot with swelling and mild erythema and slight increase in warmth to dorsal aspect.  Tenderness to palpation along lateral aspect of fifth MTP.  Dorsalis pedis 2+; cap refill approximately 2 seconds  Swelling does not extend into ankle or calf, no calf tenderness, negative Homans.  Neurological: He is alert and oriented to person, place, and time.  Skin: Skin is warm and dry.  Psychiatric: He has a normal mood and affect.  Nursing note and vitals reviewed.    UC Treatments / Results  Labs (all labs ordered are listed, but only abnormal results are displayed) Labs Reviewed  URIC ACID    EKG None  Radiology Dg Foot Complete Left  Result Date: 04/05/2018 CLINICAL DATA:  Foot swelling for 5 days.  No known injury. EXAM:  LEFT FOOT - COMPLETE 3+ VIEW COMPARISON:  None FINDINGS: There is diffuse soft tissue swelling with no identified bony abnormality. IMPRESSION: Soft tissue swelling with no identified bony abnormality. Electronically Signed   By: Dorise Bullion III M.D   On: 04/05/2018 15:45    Procedures Procedures (including critical care time)  Medications Ordered in UC Medications - No data to display  Initial Impression / Assessment and Plan / UC Course  I have reviewed the triage vital signs and the nursing notes.  Pertinent labs & imaging results that were available during my care of the patient  were reviewed by me and considered in my medical decision making (see chart for details).     Patient with left foot swelling, possible overuse injury versus gout versus DVT.  Given history of gout we will go ahead and treat with prednisone as he has been taking NSAIDs without relief.  Continue icing and elevation.  Outpatient ultrasound of lower extremity ordered for patient to go first thing in the morning to rule out DVT.  Return here go to emergency room if developing worsening pain, swelling, numbness or tingling of the foot, symptoms not improving, developing chest pain or shortness of breath. Discussed strict return precautions. Patient verbalized understanding and is agreeable with plan.  Final Clinical Impressions(s) / UC Diagnoses   Final diagnoses:  Swelling of left foot     Discharge Instructions     No fracture or other abnormality seen on x-ray  Please begin prednisone daily for the next 5 days  Outpatient ultrasound-please go to main entrance of my [facility] hospital, ask for vascular lab, please arrive prior to 8 AM   ED Prescriptions    Medication Sig Dispense Auth. Provider   predniSONE (DELTASONE) 50 MG tablet Take 1 tablet (50 mg total) by mouth daily with breakfast for 5 days. 5 tablet Demani Weyrauch C, PA-C     Controlled Substance Prescriptions [place] Controlled Substance  Registry consulted? Not Applicable   [place], [place], [place] 04/06/18 1105

## 2018-04-09 ENCOUNTER — Ambulatory Visit (HOSPITAL_COMMUNITY): Admission: RE | Admit: 2018-04-09 | Payer: BLUE CROSS/BLUE SHIELD | Source: Ambulatory Visit

## 2018-04-10 ENCOUNTER — Telehealth: Payer: Self-pay | Admitting: Sports Medicine

## 2018-04-10 NOTE — Telephone Encounter (Signed)
Copied from CRM [id number]. Topic: Quick Communication - Rx Refill/Question >> Apr 10, 2018 11:39 AM Baldo Daub L wrote: Medication: Mitigar - for gout flare, covered by insurance at no cost  Has the patient contacted their pharmacy? no (Agent: If no, request that the patient contact the pharmacy for the refill.) (Agent: If yes, when and what did the pharmacy advise?)  Preferred Pharmacy (with phone number or street name[phone] [place], [place] - [address] [phone] (Phone) [phone] (Fax)  Agent: Please be advised that RX refills may take up to 3 business days. We ask that you follow-up with your pharmacy.

## 2018-04-10 NOTE — Telephone Encounter (Signed)
Patient is requesting medication for gout:  Mitigar                     Not on med list  LOV: 02/20/18  PCP: Urban Gibson  Pharmacy: verified

## 2018-04-10 NOTE — Telephone Encounter (Deleted)
Copied from CRM #[id number]. Topic: Quick Communication - Rx Refill/Question °>> Apr 10, 2018 11:39 AM Alexander, Amber L wrote: °Medication: °Mitigar - for gout flare, covered by insurance at no cost ° °Has the patient contacted their pharmacy? no °(Agent: If no, request that the patient contact the pharmacy for the refill.) °(Agent: If yes, when and what did the pharmacy advise?) ° °Preferred Pharmacy (with phone number or street name): [id number] [place] [place] - [address] [phone] (Phone) °[phone] (Fax) ° °Agent: Please be advised that RX refills may take up to 3 business days. We ask that you follow-up with your pharmacy. °

## 2018-04-14 ENCOUNTER — Encounter (HOSPITAL_COMMUNITY): Payer: Self-pay

## 2018-04-14 ENCOUNTER — Other Ambulatory Visit: Payer: Self-pay | Admitting: Emergency Medicine

## 2018-04-14 DIAGNOSIS — M1009 Idiopathic gout, multiple sites: Secondary | ICD-10-CM

## 2018-04-14 MED ORDER — COLCHICINE 0.6 MG PO CAPS: 1.0000 | ORAL_CAPSULE | Freq: Two times a day (BID) | ORAL | 3 refills | Status: DC

## 2018-06-01 ENCOUNTER — Telehealth: Payer: Self-pay | Admitting: Sports Medicine

## 2018-06-01 ENCOUNTER — Encounter: Payer: Self-pay | Admitting: Sports Medicine

## 2018-06-01 NOTE — Telephone Encounter (Signed)
Please advise 

## 2018-06-01 NOTE — Telephone Encounter (Signed)
Copied from CRM [id number]. Topic: Quick Communication - See Telephone Encounter >> Jun 01, 2018 11:33 AM Lenoria Chime wrote: CRM for notification. See Telephone encounter for: 06/01/18. Cathy from [facility] called needing Pre surgery clearance papers filled out and faxed back to her office.

## 2018-06-01 NOTE — Telephone Encounter (Signed)
He needs an appointment for surgical clearance

## 2018-06-02 NOTE — Telephone Encounter (Signed)
Please schedule

## 2018-06-02 NOTE — Telephone Encounter (Signed)
Called patient and scheduled surgical clearance with Dr.T.

## 2018-06-04 ENCOUNTER — Telehealth: Payer: Self-pay | Admitting: *Deleted

## 2018-06-04 ENCOUNTER — Other Ambulatory Visit (HOSPITAL_COMMUNITY): Payer: Self-pay

## 2018-06-04 NOTE — Telephone Encounter (Signed)
Entered in error

## 2018-06-04 NOTE — Telephone Encounter (Signed)
Faxed medical clearance form patient has an appointment 06/10/2018 with Dr Benjamin Stain. Confirmation page received at 2:00 pm.

## 2018-06-09 ENCOUNTER — Ambulatory Visit: Payer: Self-pay | Admitting: Sports Medicine

## 2018-06-10 ENCOUNTER — Ambulatory Visit (INDEPENDENT_AMBULATORY_CARE_PROVIDER_SITE_OTHER): Payer: BLUE CROSS/BLUE SHIELD | Admitting: Sports Medicine

## 2018-06-10 DIAGNOSIS — Z01818 Encounter for other preprocedural examination: Secondary | ICD-10-CM

## 2018-06-10 NOTE — Assessment & Plan Note (Addendum)
Patient will be undergoing knee arthroplasty, this is intermediate risk noncardiac surgery requiring 4 metabolic equivalents of cardiac capacity. He has this. Twelve-lead ECG unremarkable. Echocardiogram a few months ago was normal, coronary artery calcium score was normal. Routine labs a few months ago were normal. He is cleared for intermediate risk noncardiac surgery i.e. total knee arthroplasty.

## 2018-06-10 NOTE — Progress Notes (Signed)
Subjective:    CC: Surgical clearance  HPI: Patient is being scheduled for a total knee arthroplasty, he is able to walk up several flights of stairs and several city blocks as well as exercise at high level without any chest pain, shortness of breath.  I reviewed the past medical history, family history, social history, surgical history, and allergies today and no changes were needed.  Please see the problem list section below in epic for further details.  Past Medical History: Past Medical History:  Diagnosis Date  . Hypothyroidism   . Thyroid disease    Hypothyroid   Past Surgical History: Past Surgical History:  Procedure Laterality Date  . CERVICAL DISC SURGERY     #5  . POSTERIOR CERVICAL LAMINECTOMY WITH MET- RX Left 09/20/2014   Procedure: Left C7-T1 "Sitting" Microdiskectomy;  Surgeon: Kristeen Miss, MD;  Location: [facility];  Service: Neurosurgery;  Laterality: Left;  Left C7-T1 "Sitting" Microdiskectomy  . QUADRICEPS TENDON REPAIR    . SHOULDER SURGERY     Social History: Social History   Socioeconomic History  . Marital status: Single    Spouse name: Not on file  . Number of children: Not on file  . Years of education: Not on file  . Highest education level: Not on file  Occupational History  . Not on file  Social Needs  . Financial resource strain: Not on file  . Food insecurity:    Worry: Not on file    Inability: Not on file  . Transportation needs:    Medical: Not on file    Non-medical: Not on file  Tobacco Use  . Smoking status: Never Smoker  . Smokeless tobacco: Never Used  Substance and Sexual Activity  . Alcohol use: Yes    Alcohol/week: 0.0 standard drinks    Comment: occasional  . Drug use: No  . Sexual activity: Never  Lifestyle  . Physical activity:    Days per week: Not on file    Minutes per session: Not on file  . Stress: Not on file  Relationships  . Social connections:    Talks on phone: Not on file    Gets together: Not on  file    Attends religious service: Not on file    Active member of club or organization: Not on file    Attends meetings of clubs or organizations: Not on file    Relationship status: Not on file  Other Topics Concern  . Not on file  Social History Narrative  . Not on file   Family History: Family History  Problem Relation Age of Onset  . Heart disease Mother   . Stroke Mother   . Diabetes Father   . Colon cancer Maternal Grandfather   . Esophageal cancer Neg Hx   . Rectal cancer Neg Hx   . Stomach cancer Neg Hx    Allergies: No Known Allergies Medications: See med rec.  Review of Systems: No fevers, chills, night sweats, weight loss, chest pain, or shortness of breath.   Objective:    General: Well Developed, well nourished, and in no acute distress.  Neuro: Alert and oriented x3, extra-ocular muscles intact, sensation grossly intact.  HEENT: Normocephalic, atraumatic, pupils equal round reactive to light, neck supple, no masses, no lymphadenopathy, thyroid nonpalpable.  Skin: Warm and dry, no rashes. Cardiac: Regular rate and rhythm, no murmurs rubs or gallops, no lower extremity edema.  Respiratory: Clear to auscultation bilaterally. Not using accessory muscles, speaking in full  sentences.  Twelve-lead ECG reviewed and normal.  Impression and Recommendations:    Preoperative evaluation of a medical condition to rule out surgical contraindications (TAR required) Patient will be undergoing knee arthroplasty, this is intermediate risk noncardiac surgery requiring 4 metabolic equivalents of cardiac capacity. He has this. Twelve-lead ECG unremarkable. Echocardiogram a few months ago was normal, coronary artery calcium score was normal. Routine labs a few months ago were normal. He is cleared for intermediate risk noncardiac surgery i.e. total knee arthroplasty.  ___________________________________________ Gwen Her. Dianah Field, M.D., ABFM., CAQSM. Primary Care and  Sports Medicine [facility]  Adjunct Professor of [employer]

## 2018-06-12 DIAGNOSIS — Z01812 Encounter for preprocedural laboratory examination: Secondary | ICD-10-CM | POA: Diagnosis not present

## 2018-06-15 DIAGNOSIS — M1711 Unilateral primary osteoarthritis, right knee: Secondary | ICD-10-CM | POA: Diagnosis not present

## 2018-06-16 ENCOUNTER — Ambulatory Visit: Admit: 2018-06-16 | Payer: BLUE CROSS/BLUE SHIELD | Admitting: Orthopaedic Surgery

## 2018-06-16 SURGERY — ARTHROPLASTY, KNEE, TOTAL
Anesthesia: Spinal | Laterality: Right

## 2018-06-26 ENCOUNTER — Encounter: Payer: Self-pay | Admitting: Sports Medicine

## 2018-07-06 ENCOUNTER — Other Ambulatory Visit: Payer: Self-pay | Admitting: Emergency Medicine

## 2018-07-06 DIAGNOSIS — M1009 Idiopathic gout, multiple sites: Secondary | ICD-10-CM

## 2018-08-03 DIAGNOSIS — Z01812 Encounter for preprocedural laboratory examination: Secondary | ICD-10-CM | POA: Diagnosis not present

## 2018-08-10 DIAGNOSIS — M1711 Unilateral primary osteoarthritis, right knee: Secondary | ICD-10-CM | POA: Diagnosis not present

## 2018-08-11 ENCOUNTER — Ambulatory Visit (INDEPENDENT_AMBULATORY_CARE_PROVIDER_SITE_OTHER): Payer: BLUE CROSS/BLUE SHIELD | Admitting: Sports Medicine

## 2018-08-11 ENCOUNTER — Encounter: Payer: Self-pay | Admitting: Sports Medicine

## 2018-08-11 DIAGNOSIS — M119 Crystal arthropathy, unspecified: Secondary | ICD-10-CM | POA: Diagnosis not present

## 2018-08-11 DIAGNOSIS — E291 Testicular hypofunction: Secondary | ICD-10-CM

## 2018-08-11 DIAGNOSIS — M6580 Other synovitis and tenosynovitis, unspecified site: Secondary | ICD-10-CM | POA: Diagnosis not present

## 2018-08-11 DIAGNOSIS — I10 Essential (primary) hypertension: Secondary | ICD-10-CM | POA: Diagnosis not present

## 2018-08-11 HISTORY — DX: Essential (primary) hypertension: I10

## 2018-08-11 MED ORDER — TESTOSTERONE CYPIONATE 200 MG/ML IM SOLN: 150.0000 mg | INTRAMUSCULAR | 0 refills | Status: DC

## 2018-08-11 MED ORDER — LISINOPRIL 10 MG PO TABS: 10.0000 mg | ORAL_TABLET | Freq: Every day | ORAL | 3 refills | Status: DC

## 2018-08-11 MED ORDER — ASPIRIN EC 81 MG PO TBEC: 81.0000 mg | DELAYED_RELEASE_TABLET | Freq: Every day | ORAL | 3 refills | Status: DC

## 2018-08-11 NOTE — Assessment & Plan Note (Addendum)
He is doing his testosterone injections 0.75 mL weekly. Rechecking testosterone, PSA today. He will lose insurance after the new year, I am going to give him 6 months of testosterone.  Testosterone levels are definitely too high, we need to drop the dose to 0.6 mL every week, recheck right in between shots in 3 weeks.

## 2018-08-11 NOTE — Progress Notes (Addendum)
Subjective:    CC: Multiple issues  HPI: Hypertension: No headaches, visual changes, chest pain, agreeable to finally start treatment.  Hypogonadism: Doing well, most recent testosterone levels were about 1000, he is 1.75 mg every week.  Due for recheck.  Gout: Has been resistant to uric acid lowering therapy in the past.  I reviewed the past medical history, family history, social history, surgical history, and allergies today and no changes were needed.  Please see the problem list section below in epic for further details.  Past Medical History: Past Medical History:  Diagnosis Date  . Benign essential hypertension 08/11/2018  . Hypothyroidism   . Thyroid disease    Hypothyroid   Past Surgical History: Past Surgical History:  Procedure Laterality Date  . CERVICAL DISC SURGERY     #5  . POSTERIOR CERVICAL LAMINECTOMY WITH MET- RX Left 09/20/2014   Procedure: Left C7-T1 "Sitting" Microdiskectomy;  Surgeon: Kristeen Miss, MD;  Location: [facility];  Service: Neurosurgery;  Laterality: Left;  Left C7-T1 "Sitting" Microdiskectomy  . QUADRICEPS TENDON REPAIR    . SHOULDER SURGERY     Social History: Social History   Socioeconomic History  . Marital status: Single    Spouse name: Not on file  . Number of children: Not on file  . Years of education: Not on file  . Highest education level: Not on file  Occupational History  . Not on file  Social Needs  . Financial resource strain: Not on file  . Food insecurity:    Worry: Not on file    Inability: Not on file  . Transportation needs:    Medical: Not on file    Non-medical: Not on file  Tobacco Use  . Smoking status: Never Smoker  . Smokeless tobacco: Never Used  Substance and Sexual Activity  . Alcohol use: Yes    Alcohol/week: 0.0 standard drinks    Comment: occasional  . Drug use: No  . Sexual activity: Never  Lifestyle  . Physical activity:    Days per week: Not on file    Minutes per session: Not on file    . Stress: Not on file  Relationships  . Social connections:    Talks on phone: Not on file    Gets together: Not on file    Attends religious service: Not on file    Active member of club or organization: Not on file    Attends meetings of clubs or organizations: Not on file    Relationship status: Not on file  Other Topics Concern  . Not on file  Social History Narrative  . Not on file   Family History: Family History  Problem Relation Age of Onset  . Heart disease Mother   . Stroke Mother   . Diabetes Father   . Colon cancer Maternal Grandfather   . Esophageal cancer Neg Hx   . Rectal cancer Neg Hx   . Stomach cancer Neg Hx    Allergies: No Known Allergies Medications: See med rec.  Review of Systems: No fevers, chills, night sweats, weight loss, chest pain, or shortness of breath.   Objective:    General: Well Developed, well nourished, and in no acute distress.  Neuro: Alert and oriented x3, extra-ocular muscles intact, sensation grossly intact.  HEENT: Normocephalic, atraumatic, pupils equal round reactive to light, neck supple, no masses, no lymphadenopathy, thyroid nonpalpable.  Skin: Warm and dry, no rashes. Cardiac: Regular rate and rhythm, no murmurs rubs or gallops, no  lower extremity edema.  Respiratory: Clear to auscultation bilaterally. Not using accessory muscles, speaking in full sentences.  Impression and Recommendations:    Benign essential hypertension Improved considerably but ideally we need less than 120/80. His mother has had multiple strokes and heart attacks. Adding aspirin 81 and lisinopril 10. Return in 2 weeks and a nurse visit blood pressure check.  Crystalline arthropathy with gout and pseudogout Continues to be resistant to allopurinol/Uloric. Rechecking uric acid levels, if elevated we will do allopurinol.  Very elevated, adding allopurinol 300 daily, recheck in 4 weeks.  Male hypogonadism He is doing his testosterone injections  0.75 mL weekly. Rechecking testosterone, PSA today. He will lose insurance after the new year, I am going to give him 6 months of testosterone.  Testosterone levels are definitely too high, we need to drop the dose to 0.6 mL every week, recheck right in between shots in 3 weeks. ___________________________________________ Gwen Her. Dianah Field, M.D., ABFM., CAQSM. Primary Care and Sports Medicine [facility]  Adjunct Professor of [employer] [employer] Medicine

## 2018-08-11 NOTE — Assessment & Plan Note (Addendum)
Continues to be resistant to allopurinol/Uloric. Rechecking uric acid levels, if elevated we will do allopurinol.  Very elevated, adding allopurinol 300 daily, recheck in 4 weeks.

## 2018-08-11 NOTE — Patient Instructions (Signed)
7.8 % 10-year risk of MI or death for you 13 % is the average 10-year risk of MI or death in Americans your age.

## 2018-08-11 NOTE — Assessment & Plan Note (Signed)
Improved considerably but ideally we need less than 120/80. His mother has had multiple strokes and heart attacks. Adding aspirin 81 and lisinopril 10. Return in 2 weeks and a nurse visit blood pressure check.

## 2018-08-12 MED ORDER — ALLOPURINOL 300 MG PO TABS: 300.0000 mg | ORAL_TABLET | Freq: Every day | ORAL | 6 refills | Status: DC

## 2018-08-12 NOTE — Addendum Note (Signed)
Addended by: Monica Becton on: 08/12/2018 06:44 PM   Modules accepted: Orders

## 2018-08-13 DIAGNOSIS — Z96651 Presence of right artificial knee joint: Secondary | ICD-10-CM | POA: Diagnosis not present

## 2018-08-13 DIAGNOSIS — M1711 Unilateral primary osteoarthritis, right knee: Secondary | ICD-10-CM | POA: Diagnosis not present

## 2018-08-14 ENCOUNTER — Encounter: Payer: Self-pay | Admitting: Sports Medicine

## 2018-08-15 LAB — LIPID PANEL W/REFLEX DIRECT LDL
Cholesterol: 136 mg/dL (ref <200)
HDL: 32 mg/dL — ABNORMAL LOW (ref >40)
LDL Cholesterol (Calc): 87 mg/dL
Non-HDL Cholesterol (Calc): 104 mg/dL (calc) (ref <130)
Total CHOL/HDL Ratio: 4.3 (calc) (ref <5.0)
Triglycerides: 84 mg/dL (ref <150)

## 2018-08-15 LAB — COMPREHENSIVE METABOLIC PANEL WITH GFR
AG Ratio: 1.5 (calc) (ref 1.0–2.5)
ALT: 24 U/L (ref 9–46)
Alkaline phosphatase (APISO): 48 U/L (ref 40–115)
BUN: 16 mg/dL (ref 7–25)
CO2: 26 mmol/L (ref 20–32)
Potassium: 4.1 mmol/L (ref 3.5–5.3)
Sodium: 141 mmol/L (ref 135–146)
Total Bilirubin: 0.8 mg/dL (ref 0.2–1.2)
Total Protein: 6.9 g/dL (ref 6.1–8.1)

## 2018-08-15 LAB — TESTOSTERONE, FREE & TOTAL
Free Testosterone: 414 pg/mL — ABNORMAL HIGH (ref 35.0–155.0)
Testosterone, Total, LC-MS-MS: 1568 ng/dL — ABNORMAL HIGH (ref 250–1100)

## 2018-08-15 LAB — PSA, TOTAL AND FREE
PSA, % Free: 21 % — ABNORMAL LOW (ref >25)
PSA, Free: 0.4 ng/mL
PSA, Total: 1.9 ng/mL (ref <=4.0)

## 2018-08-15 LAB — CBC

## 2018-08-15 LAB — COMPREHENSIVE METABOLIC PANEL
AST: 22 U/L (ref 10–35)
Albumin: 4.1 g/dL (ref 3.6–5.1)
BUN/Creatinine Ratio: 10 (calc) (ref 6–22)
Calcium: 9.4 mg/dL (ref 8.6–10.3)
Chloride: 108 mmol/L (ref 98–110)
Creat: 1.53 mg/dL — ABNORMAL HIGH (ref 0.70–1.33)
Globulin: 2.8 g/dL (calc) (ref 1.9–3.7)
Glucose, Bld: 99 mg/dL (ref 65–99)

## 2018-08-15 LAB — URIC ACID: Uric Acid, Serum: 9.3 mg/dL — ABNORMAL HIGH (ref 4.0–8.0)

## 2018-08-15 LAB — TSH: TSH: 1.56 m[IU]/L (ref 0.40–4.50)

## 2018-08-17 NOTE — Addendum Note (Signed)
Addended by: Monica Becton on: 08/17/2018 08:17 AM   Modules accepted: Orders

## 2018-08-19 DIAGNOSIS — M1711 Unilateral primary osteoarthritis, right knee: Secondary | ICD-10-CM | POA: Diagnosis not present

## 2018-08-20 DIAGNOSIS — Z96651 Presence of right artificial knee joint: Secondary | ICD-10-CM | POA: Diagnosis not present

## 2018-08-20 DIAGNOSIS — Z9889 Other specified postprocedural states: Secondary | ICD-10-CM | POA: Diagnosis not present

## 2018-08-20 DIAGNOSIS — M1711 Unilateral primary osteoarthritis, right knee: Secondary | ICD-10-CM | POA: Diagnosis not present

## 2018-08-21 ENCOUNTER — Other Ambulatory Visit (HOSPITAL_COMMUNITY): Payer: Self-pay | Admitting: Orthopedic Surgery

## 2018-08-21 ENCOUNTER — Ambulatory Visit (HOSPITAL_COMMUNITY)
Admission: RE | Admit: 2018-08-21 | Discharge: 2018-08-21 | Disposition: A | Discharge status: Home / Self Care | Payer: BLUE CROSS/BLUE SHIELD | Source: Ambulatory Visit | Attending: Orthopedic Surgery | Admitting: Orthopedic Surgery

## 2018-08-21 DIAGNOSIS — M7989 Other specified soft tissue disorders: Principal | ICD-10-CM

## 2018-08-21 DIAGNOSIS — R599 Enlarged lymph nodes, unspecified: Secondary | ICD-10-CM | POA: Diagnosis not present

## 2018-08-21 DIAGNOSIS — M79604 Pain in right leg: Secondary | ICD-10-CM

## 2018-08-21 NOTE — Progress Notes (Signed)
Right lower extremity venous duplex completed. Refer to "CV Proc" under chart review to view preliminary results.  08/21/2018 11:37 AM Gertie Fey, MHA, RVT, RDCS, RDMS

## 2018-08-23 ENCOUNTER — Encounter (HOSPITAL_COMMUNITY): Payer: Self-pay

## 2018-08-23 ENCOUNTER — Inpatient Hospital Stay (HOSPITAL_COMMUNITY)
Admission: EM | Admit: 2018-08-23 | Discharge: 2018-08-24 | DRG: 603 | Disposition: A | Discharge status: Home / Self Care | Payer: BLUE CROSS/BLUE SHIELD | Attending: Internal Medicine | Admitting: Internal Medicine

## 2018-08-23 ENCOUNTER — Other Ambulatory Visit: Payer: Self-pay

## 2018-08-23 DIAGNOSIS — E039 Hypothyroidism, unspecified: Secondary | ICD-10-CM | POA: Diagnosis present

## 2018-08-23 DIAGNOSIS — M25469 Effusion, unspecified knee: Secondary | ICD-10-CM | POA: Diagnosis not present

## 2018-08-23 DIAGNOSIS — L03115 Cellulitis of right lower limb: Secondary | ICD-10-CM | POA: Diagnosis not present

## 2018-08-23 DIAGNOSIS — L0390 Cellulitis, unspecified: Secondary | ICD-10-CM | POA: Diagnosis present

## 2018-08-23 DIAGNOSIS — Z96651 Presence of right artificial knee joint: Secondary | ICD-10-CM | POA: Diagnosis not present

## 2018-08-23 DIAGNOSIS — I10 Essential (primary) hypertension: Secondary | ICD-10-CM | POA: Diagnosis not present

## 2018-08-23 DIAGNOSIS — Z7982 Long term (current) use of aspirin: Secondary | ICD-10-CM | POA: Diagnosis not present

## 2018-08-23 DIAGNOSIS — Z79890 Hormone replacement therapy (postmenopausal): Secondary | ICD-10-CM | POA: Diagnosis not present

## 2018-08-23 DIAGNOSIS — Y848 Other medical procedures as the cause of abnormal reaction of the patient, or of later complication, without mention of misadventure at the time of the procedure: Secondary | ICD-10-CM | POA: Diagnosis not present

## 2018-08-23 DIAGNOSIS — N183 Chronic kidney disease, stage 3 unspecified: Secondary | ICD-10-CM

## 2018-08-23 DIAGNOSIS — Z79899 Other long term (current) drug therapy: Secondary | ICD-10-CM | POA: Diagnosis not present

## 2018-08-23 DIAGNOSIS — M109 Gout, unspecified: Secondary | ICD-10-CM | POA: Diagnosis not present

## 2018-08-23 DIAGNOSIS — N289 Disorder of kidney and ureter, unspecified: Secondary | ICD-10-CM | POA: Diagnosis not present

## 2018-08-23 DIAGNOSIS — L7682 Other postprocedural complications of skin and subcutaneous tissue: Secondary | ICD-10-CM | POA: Diagnosis not present

## 2018-08-23 DIAGNOSIS — T8140XA Infection following a procedure, unspecified, initial encounter: Secondary | ICD-10-CM | POA: Diagnosis present

## 2018-08-23 DIAGNOSIS — E291 Testicular hypofunction: Secondary | ICD-10-CM | POA: Diagnosis present

## 2018-08-23 DIAGNOSIS — N2889 Other specified disorders of kidney and ureter: Secondary | ICD-10-CM

## 2018-08-23 LAB — URIC ACID: Uric Acid, Serum: 8.3 mg/dL (ref 3.7–8.6)

## 2018-08-23 LAB — COMPREHENSIVE METABOLIC PANEL
ALT: 74 U/L — ABNORMAL HIGH (ref 0–44)
ANION GAP: 9 (ref 5–15)
AST: 53 U/L — ABNORMAL HIGH (ref 15–41)
Albumin: 3.1 g/dL — ABNORMAL LOW (ref 3.5–5.0)
Alkaline Phosphatase: 69 U/L (ref 38–126)
BUN: 15 mg/dL (ref 6–20)
CHLORIDE: 106 mmol/L (ref 98–111)
CO2: 25 mmol/L (ref 22–32)
Calcium: 8.7 mg/dL — ABNORMAL LOW (ref 8.9–10.3)
Creatinine, Ser: 1.59 mg/dL — ABNORMAL HIGH (ref 0.61–1.24)
GFR calc non Af Amer: 48 mL/min — ABNORMAL LOW (ref >60)
GFR, EST AFRICAN AMERICAN: 56 mL/min — AB (ref >60)
Glucose, Bld: 123 mg/dL — ABNORMAL HIGH (ref 70–99)
Potassium: 3.6 mmol/L (ref 3.5–5.1)
SODIUM: 140 mmol/L (ref 135–145)
Total Bilirubin: 1.7 mg/dL — ABNORMAL HIGH (ref 0.3–1.2)
Total Protein: 7 g/dL (ref 6.5–8.1)

## 2018-08-23 LAB — LACTIC ACID, PLASMA
LACTIC ACID, VENOUS: 0.7 mmol/L (ref 0.5–1.9)
Lactic Acid, Venous: 0.8 mmol/L (ref 0.5–1.9)

## 2018-08-23 LAB — CBC WITH DIFFERENTIAL/PLATELET
Abs Immature Granulocytes: 0.15 10*3/uL — ABNORMAL HIGH (ref 0.00–0.07)
BASOS ABS: 0.1 10*3/uL (ref 0.0–0.1)
BASOS PCT: 0 %
EOS PCT: 6 %
Eosinophils Absolute: 0.8 10*3/uL — ABNORMAL HIGH (ref 0.0–0.5)
HCT: 38.7 % — ABNORMAL LOW (ref 39.0–52.0)
Hemoglobin: 12.4 g/dL — ABNORMAL LOW (ref 13.0–17.0)
Immature Granulocytes: 1 %
Lymphocytes Relative: 11 %
Lymphs Abs: 1.4 10*3/uL (ref 0.7–4.0)
MCH: 28.8 pg (ref 26.0–34.0)
MCHC: 32 g/dL (ref 30.0–36.0)
MCV: 89.8 fL (ref 80.0–100.0)
Monocytes Absolute: 1.3 10*3/uL — ABNORMAL HIGH (ref 0.1–1.0)
Monocytes Relative: 11 %
NEUTROS PCT: 71 %
NRBC: 0 % (ref 0.0–0.2)
Neutro Abs: 8.7 10*3/uL — ABNORMAL HIGH (ref 1.7–7.7)
PLATELETS: 467 10*3/uL — AB (ref 150–400)
RBC: 4.31 MIL/uL (ref 4.22–5.81)
RDW: 12.3 % (ref 11.5–15.5)
WBC: 12.3 10*3/uL — ABNORMAL HIGH (ref 4.0–10.5)

## 2018-08-23 MED ORDER — PIPERACILLIN-TAZOBACTAM 3.375 G IVPB
3.3750 g | Freq: Three times a day (TID) | INTRAVENOUS | Status: DC
  Administered 2018-08-23 – 2018-08-24 (×3): 3.375 g via INTRAVENOUS
  Filled 2018-08-23 (×4): qty 50

## 2018-08-23 MED ORDER — PREDNISONE 20 MG PO TABS
40.0000 mg | ORAL_TABLET | Freq: Every day | ORAL | Status: DC
  Administered 2018-08-23 – 2018-08-24 (×2): 40 mg via ORAL
  Filled 2018-08-23 (×2): qty 2

## 2018-08-23 MED ORDER — HYDROMORPHONE HCL 1 MG/ML IJ SOLN
1.0000 mg | Freq: Once | INTRAMUSCULAR | Status: AC
  Administered 2018-08-23: 1 mg via INTRAVENOUS
  Filled 2018-08-23: qty 1

## 2018-08-23 MED ORDER — ACETAMINOPHEN 325 MG PO TABS
650.0000 mg | ORAL_TABLET | Freq: Four times a day (QID) | ORAL | Status: DC | PRN
  Administered 2018-08-23: 650 mg via ORAL
  Filled 2018-08-23: qty 2

## 2018-08-23 MED ORDER — ACETAMINOPHEN 650 MG RE SUPP: 650.0000 mg | Freq: Four times a day (QID) | RECTAL | Status: DC | PRN

## 2018-08-23 MED ORDER — ADULT MULTIVITAMIN W/MINERALS CH
1.0000 | ORAL_TABLET | Freq: Every day | ORAL | Status: DC
  Filled 2018-08-23: qty 1

## 2018-08-23 MED ORDER — MORPHINE SULFATE (PF) 4 MG/ML IV SOLN
4.0000 mg | Freq: Once | INTRAVENOUS | Status: AC
  Administered 2018-08-23: 4 mg via INTRAVENOUS
  Filled 2018-08-23: qty 1

## 2018-08-23 MED ORDER — OXYCODONE HCL 5 MG PO TABS
5.0000 mg | ORAL_TABLET | ORAL | Status: DC | PRN
  Administered 2018-08-23 (×2): 5 mg via ORAL
  Filled 2018-08-23 (×2): qty 1

## 2018-08-23 MED ORDER — ENOXAPARIN SODIUM 40 MG/0.4ML ~~LOC~~ SOLN
40.0000 mg | SUBCUTANEOUS | Status: DC
  Administered 2018-08-23: 40 mg via SUBCUTANEOUS
  Filled 2018-08-23: qty 0.4

## 2018-08-23 MED ORDER — VANCOMYCIN HCL IN DEXTROSE 1-5 GM/200ML-% IV SOLN: 1000.0000 mg | Freq: Once | INTRAVENOUS | Status: DC

## 2018-08-23 MED ORDER — SODIUM CHLORIDE 0.9 % IV SOLN: 250.0000 mL | INTRAVENOUS | Status: DC | PRN

## 2018-08-23 MED ORDER — SODIUM CHLORIDE 0.9% FLUSH
3.0000 mL | INTRAVENOUS | Status: DC | PRN
  Administered 2018-08-23: 3 mL via INTRAVENOUS
  Filled 2018-08-23: qty 3

## 2018-08-23 MED ORDER — HYDROMORPHONE HCL 1 MG/ML IJ SOLN
1.5000 mg | Freq: Once | INTRAMUSCULAR | Status: AC
  Administered 2018-08-23: 1.5 mg via INTRAVENOUS
  Filled 2018-08-23: qty 2

## 2018-08-23 MED ORDER — SODIUM CHLORIDE 0.9% FLUSH
3.0000 mL | Freq: Two times a day (BID) | INTRAVENOUS | Status: DC
  Administered 2018-08-23 – 2018-08-24 (×2): 3 mL via INTRAVENOUS

## 2018-08-23 MED ORDER — VANCOMYCIN HCL 10 G IV SOLR
2000.0000 mg | INTRAVENOUS | Status: DC
  Administered 2018-08-23: 2000 mg via INTRAVENOUS
  Filled 2018-08-23: qty 2000

## 2018-08-23 MED ORDER — LEVOTHYROXINE SODIUM 75 MCG PO TABS
150.0000 ug | ORAL_TABLET | Freq: Every day | ORAL | Status: DC
  Administered 2018-08-24: 150 ug via ORAL
  Filled 2018-08-23: qty 2

## 2018-08-23 MED ORDER — PIPERACILLIN-TAZOBACTAM 3.375 G IVPB 30 MIN
3.3750 g | Freq: Once | INTRAVENOUS | Status: AC
  Administered 2018-08-23: 3.375 g via INTRAVENOUS
  Filled 2018-08-23: qty 50

## 2018-08-23 MED ORDER — LISINOPRIL 10 MG PO TABS
10.0000 mg | ORAL_TABLET | Freq: Every day | ORAL | Status: DC
  Filled 2018-08-23 (×2): qty 1

## 2018-08-23 MED ORDER — VANCOMYCIN HCL 10 G IV SOLR
2000.0000 mg | INTRAVENOUS | Status: AC
  Administered 2018-08-23: 2000 mg via INTRAVENOUS
  Filled 2018-08-23: qty 2000

## 2018-08-23 MED ORDER — KETOROLAC TROMETHAMINE 30 MG/ML IJ SOLN
30.0000 mg | Freq: Four times a day (QID) | INTRAMUSCULAR | Status: DC | PRN
  Administered 2018-08-23: 30 mg via INTRAVENOUS
  Filled 2018-08-23: qty 1

## 2018-08-23 MED ORDER — SODIUM CHLORIDE 0.9 % IV BOLUS
1000.0000 mL | Freq: Once | INTRAVENOUS | Status: AC
  Administered 2018-08-23: 1000 mL via INTRAVENOUS

## 2018-08-23 MED ORDER — ASPIRIN EC 81 MG PO TBEC
81.0000 mg | DELAYED_RELEASE_TABLET | Freq: Every day | ORAL | Status: DC
  Administered 2018-08-23 – 2018-08-24 (×2): 81 mg via ORAL
  Filled 2018-08-23 (×2): qty 1

## 2018-08-23 NOTE — Progress Notes (Addendum)
Pharmacy Antibiotic Note  GEOVANY SCHOENBACHLER is a 58 y.o. male admitted on 08/23/2018 with cellulitis s/p TKA 1/2.  Pharmacy has been consulted for Vancomycin and Zosyn  Plan: Zosyn 3.375gm IV q8h - each dose over 4 hours Vancomycin 2gm IV  Q 24 hrs. Goal AUC 400-550. Expected AUC: 470 SCr used: 1.59 Will f/u micro data, pt's clinical condition, and renal function Levels prn   Height: 6\' 3"  (190.5 cm) Weight: 230 lb (104.3 kg) IBW/kg (Calculated) : 84.5  Temp (24hrs), Avg:99.6 F (37.6 C), Min:99.5 F (37.5 C), Max:99.6 F (37.6 C)  Recent Labs  Lab 08/23/18 0124  WBC 12.3*  CREATININE 1.59*  LATICACIDVEN 0.8    Estimated Creatinine Clearance: 68.6 mL/min (A) (by C-G formula based on SCr of 1.59 mg/dL (H)).    No Known Allergies  Antimicrobials this admission: 1/12 Vanc >>  1/12 Zosyn >>  Microbiology results: 1/12 BCx:   Thank you for allowing pharmacy to be a part of this patient's care.  Lavonia Dana 08/23/2018 2:49 AM

## 2018-08-23 NOTE — ED Notes (Signed)
Breakfast tray ordered 

## 2018-08-23 NOTE — ED Triage Notes (Addendum)
Patient had TKR (total right knee replacement) a few days ago and has had increased swelling, pain, redness and fever.

## 2018-08-23 NOTE — ED Provider Notes (Signed)
Medical screening examination/treatment/procedure(s) were conducted as a shared visit with non-physician practitioner(s) and myself.  I personally evaluated the patient during the encounter.  None  Patient is a 58 year old male who presents to the emergency department with extensive cellulitis to the right lower extremity.  He is status post total knee arthroplasty with Dr. Yisroel Ramming 08/13/2018.  Was seen yesterday and started on Keflex and had negative DVT study yesterday.  Since that time he states symptoms progressively worse and he has had fever of 101 at home.  Pain uncontrolled.  Given broad-spectrum antibiotics here and admitted.  Joint does not appear to be infected at this time.  Orthopedic surgery has been consulted.   Jeremiah Webb, Layla Maw, DO 08/23/18 [phone]

## 2018-08-23 NOTE — ED Notes (Signed)
Attempted to call report to floor 

## 2018-08-23 NOTE — ED Provider Notes (Signed)
[facility] EMERGENCY DEPARTMENT Provider Note   CSN: [id number] Arrival date & time: 08/23/18  0035     History   Chief Complaint Chief Complaint  Patient presents with  . Leg Swelling    right    HPI Jeremiah Webb is a 58 y.o. male.  Patient to ED with complaint of right lower extremity pain, swelling, redness and fever. He had a total knee replacement on 08/13/18 (Dr. Rhona Raider). He has had some degree of lower leg swelling that has increased significantly over the last several days. He reports a fever with Tmax of 101.2 at home. The leg has become increasing red and painful, citing intense, burning type pain with ambulation. He was seen in the orthopedic office yesterday by Dr. Mayer Camel who had a doppler study done that, per patient, was negative for DVT, and he was started on Keflex late yesterday. Symptoms worsened today prompting ED evaluation. No nausea, vomiting, SOB, congestion.  The history is provided by the patient. No language interpreter was used.    Past Medical History:  Diagnosis Date  . Benign essential hypertension 08/11/2018  . Hypothyroidism   . Thyroid disease    Hypothyroid    Patient Active Problem List   Diagnosis Date Noted  . Benign essential hypertension 08/11/2018  . Preoperative evaluation of a medical condition to rule out surgical contraindications (TAR required) 06/10/2018  . Upper airway cough syndrome 01/30/2018  . Annual physical exam 12/22/2017  . Thyroid disease 08/26/2017  . Acquired cyst of kidney 01/20/2017  . Right shoulder pain 06/24/2016  . Male hypogonadism 06/06/2016  . Infrapatellar bursitis of right knee 12/19/2015  . Crystalline arthropathy with gout and pseudogout 09/22/2015  . Palpitations 02/14/2015  . Degenerative disc disease, cervical 04/05/2014    Past Surgical History:  Procedure Laterality Date  . CERVICAL DISC SURGERY     #5  . POSTERIOR CERVICAL LAMINECTOMY WITH MET- RX Left 09/20/2014   Procedure: Left C7-T1 "Sitting" Microdiskectomy;  Surgeon: Kristeen Miss, MD;  Location: [facility];  Service: Neurosurgery;  Laterality: Left;  Left C7-T1 "Sitting" Microdiskectomy  . QUADRICEPS TENDON REPAIR    . SHOULDER SURGERY          Home Medications    Prior to Admission medications   Medication Sig Start Date End Date Taking? Authorizing Provider  allopurinol (ZYLOPRIM) 300 MG tablet Take 1 tablet (300 mg total) by mouth daily. 08/12/18   Silverio Decamp, MD  aspirin EC 81 MG tablet Take 1 tablet (81 mg total) by mouth daily. 08/11/18   Silverio Decamp, MD  levothyroxine (SYNTHROID, LEVOTHROID) 150 MCG tablet TAKE 1 TABLET BY MOUTH ONCE DAILY ** NEEDS OFFICE VISIT FOR ADDDITIONAL REFILLS** 08/27/17   Horald Pollen, MD  lisinopril (PRINIVIL,ZESTRIL) 10 MG tablet Take 1 tablet (10 mg total) by mouth daily. 08/11/18   Silverio Decamp, MD  MITIGARE 0.6 MG CAPS TAKE 1 CAPSULE BY MOUTH TWICE A DAY 07/07/18   Horald Pollen, MD  testosterone cypionate (DEPOTESTOSTERONE CYPIONATE) 200 MG/ML injection Inject 0.6 mLs (120 mg total) into the muscle every 7 (seven) days. Pls include 18g needle for drawing up, 23g needle for injection. 08/17/18   Silverio Decamp, MD    Family History Family History  Problem Relation Age of Onset  . Heart disease Mother   . Stroke Mother   . Diabetes Father   . Colon cancer Maternal Grandfather   . Esophageal cancer Neg Hx   . Rectal  cancer Neg Hx   . Stomach cancer Neg Hx     Social History Social History   Tobacco Use  . Smoking status: Never Smoker  . Smokeless tobacco: Never Used  Substance Use Topics  . Alcohol use: Yes    Alcohol/week: 0.0 standard drinks    Comment: occasional  . Drug use: No     Allergies   Patient has no known allergies.   Review of Systems Review of Systems  Constitutional: Positive for fever. Negative for chills.  Respiratory: Negative.  Negative for shortness of  breath.   Cardiovascular: Negative.  Negative for chest pain.  Gastrointestinal: Negative.  Negative for abdominal pain, nausea and vomiting.  Musculoskeletal:       See HPI.  Skin: Positive for color change.       Right lower leg  Neurological: Negative.      Physical Exam Updated Vital Signs BP (!) 159/112 (BP Location: Right Arm)   Pulse (!) 101   Temp 99.6 F (37.6 C) (Oral)   Resp 18   Ht _0  (1.905 m)   Wt 104.3 kg   SpO2 98%   BMI 28.75 kg/m   Physical Exam Constitutional:      Appearance: He is well-developed.  Neck:     Musculoskeletal: Normal range of motion.  Pulmonary:     Effort: Pulmonary effort is normal.  Musculoskeletal: Normal range of motion.     Comments: Right lower extremity significantly swollen with 1-2+ pitting distally. There is erythema of circumferential lower leg with blistering of venous stasis. Post-surgical bandage to anterior knee which was not removed. Distal pulses 2+.  Skin:    General: Skin is warm and dry.  Neurological:     Mental Status: He is alert and oriented to person, place, and time.      ED Treatments / Results  Labs (all labs ordered are listed, but only abnormal results are displayed) Labs Reviewed  CULTURE, BLOOD (ROUTINE X 2)  CULTURE, BLOOD (ROUTINE X 2)  CBC WITH DIFFERENTIAL/PLATELET  COMPREHENSIVE METABOLIC PANEL  LACTIC ACID, PLASMA  LACTIC ACID, PLASMA    EKG None  Radiology Vas [place] Lower Extremity Venous (dvt)  Result Date: 08/21/2018  Lower Venous Study Indications: Pain, Swelling, Recent knee replacement x8 days, and Erythema.  Performing Technologist: Maudry Mayhew MHA, RDMS, RVT, RDCS  Examination Guidelines: A complete evaluation includes B-mode imaging, spectral Doppler, color Doppler, and power Doppler as needed of all accessible portions of each vessel. Bilateral testing is considered an integral part of a complete examination. Limited examinations for reoccurring indications may be  performed as noted.  Right Venous Findings: +---------+---------------+---------+-----------+----------+-------+          CompressibilityPhasicitySpontaneityPropertiesSummary +---------+---------------+---------+-----------+----------+-------+ CFV      Full           Yes      Yes                          +---------+---------------+---------+-----------+----------+-------+ SFJ      Full                                                 +---------+---------------+---------+-----------+----------+-------+ FV Prox  Full                                                 +---------+---------------+---------+-----------+----------+-------+  FV Mid   Full                                                 +---------+---------------+---------+-----------+----------+-------+ FV DistalFull                                                 +---------+---------------+---------+-----------+----------+-------+ PFV      Full                                                 +---------+---------------+---------+-----------+----------+-------+ POP      Full           Yes      Yes                          +---------+---------------+---------+-----------+----------+-------+ PTV      Full                                                 +---------+---------------+---------+-----------+----------+-------+ PERO     Full                                                 +---------+---------------+---------+-----------+----------+-------+  Left Venous Findings: +---+---------------+---------+-----------+----------+-------+    CompressibilityPhasicitySpontaneityPropertiesSummary +---+---------------+---------+-----------+----------+-------+ CFVFull           Yes      Yes                          +---+---------------+---------+-----------+----------+-------+    Summary: Right: There is no evidence of deep vein thrombosis in the lower extremity. A cystic structure is found in  the popliteal fossa. Ultrasound characteristics of enlarged lymph nodes are noted in the groin. Left: No evidence of common femoral vein obstruction.  *See table(s) above for measurements and observations. Electronically signed by Monica Martinez MD on 08/21/2018 at 12:28:19 PM.    Final     Procedures Procedures (including critical care time)  Medications Ordered in ED Medications  HYDROmorphone (DILAUDID) injection 1 mg (has no administration in time range)  vancomycin (VANCOCIN) IVPB 1000 mg/200 mL premix (has no administration in time range)  piperacillin-tazobactam (ZOSYN) IVPB 3.375 g (has no administration in time range)     Initial Impression / Assessment and Plan / ED Course  I have reviewed the triage vital signs and the nursing notes.  Pertinent labs & imaging results that were available during my care of the patient were reviewed by me and considered in my medical decision making (see chart for details).     Patient to ED with complaint of pain, redness and swelling of the right lower extremity - calf, foot, ankle. He reports a fever at home of Tmax 102.1. He is s/p right total knee replacement 08/13/18. Seen by Dr. Mayer Camel yesterday, had  a negative DVT study and started on Keflex for suspected infection. The patient comes to ED for worsening pain, redness and swelling extending to foot and ankle, with fever at home.  The patient has no fever in the ED. Labs show a mild leukocytosis of 12.3. The leg, however, is significantly red, hot to touch, swollen with swelling extending to foot and ankle. Knee is mildly swollen with bruising bilaterally. Findings consistent with cellulitis of LE. IV abx started.   Discussed with dr. Lynann Bologna (ortho, on call for Dr. Rhona Raider). He feels it is reassuring he had an orthopedic evaluation last night and is appropriate for medicine to admit and orthopedics will consult in am.   Final Clinical Impressions(s) / ED Diagnoses   Final diagnoses:    None   1. Post-operative infection, right LE  ED Discharge Orders    None       Charlann Lange, PA-C 08/24/18 [place], Delice Bison, DO 08/24/18 [phone]

## 2018-08-23 NOTE — Plan of Care (Signed)
  Problem: Activity: Goal: Risk for activity intolerance will decrease Outcome: Progressing   Problem: Pain Managment: Goal: General experience of comfort will improve Outcome: Progressing   

## 2018-08-23 NOTE — Progress Notes (Signed)
    Patient currently admitted to medicine with right leg cellulitis, he is now 10 days s/p right TKA. He states he was doing very well until about a week ago He denies pain in the knee, but has been having a stabbing sensation distal to the knee into the leg and foot Has been on Vanc and zosyn. Per the patient, his pain has improved overnight   Physical Exam: Vitals:   08/23/18 0600 08/23/18 0650  BP: (!) 163/96 (!) 147/65  Pulse: 83 88  Resp:  18  Temp:  98.9 F (37.2 C)  SpO2: 98% 100%    + erythema distal to knee into foot + warmth to touch Knee incision is c/d/i with no active drainage NVI  Pt 10 days s/p right TKA with right LE cellulitis  - continue abx and elevation of right LE - I suspect this will improve significantly over next 24 hours, at which point pt may be able to transition to oral abx - will discuss with Dr. Jerl Santos - someone from [facility] will reevaluate patient in the morning

## 2018-08-23 NOTE — H&P (Signed)
History and Physical    Jeremiah MEINTS WUJ:[id number] DOB: 11-May-1961 DOA: 08/23/2018  PCP: Silverio Decamp, MD  Patient coming from: Home  Chief Complaint: Right knee leg swelling and red and painful  HPI: Jeremiah Webb is a 58 y.o. male with medical history significant of right total knee replacement on August 13, 2018, hypothyroidism, hypertension who presents to the emergency department because of worsening right leg swelling erythema and pain for several days now.  Patient saw his orthopedic surgeon group leader yesterday at which point they started him on Keflex for early cellulitis and did a DVT study ultrasound which was negative yesterday.  Patient has been on Keflex for approximately 24 hours the swelling is progressively gotten worse the redness is gotten worse and is become very painful to where he cannot even participate in physical therapy anymore.  He has not had any nausea or vomiting.  He does report a fever of 102 at home.  He denies any drainage or issues with his incision at the knee.  The redness started at the ankle and spread upward.  Patient be referred for admission for postoperative infection.  Review of Systems: As per HPI otherwise 10 point review of systems negative.   Past Medical History:  Diagnosis Date  . Benign essential hypertension 08/11/2018  . Hypothyroidism   . Thyroid disease    Hypothyroid    Past Surgical History:  Procedure Laterality Date  . CERVICAL DISC SURGERY     #5  . POSTERIOR CERVICAL LAMINECTOMY WITH MET- RX Left 09/20/2014   Procedure: Left C7-T1 "Sitting" Microdiskectomy;  Surgeon: Kristeen Miss, MD;  Location: [facility];  Service: Neurosurgery;  Laterality: Left;  Left C7-T1 "Sitting" Microdiskectomy  . QUADRICEPS TENDON REPAIR    . SHOULDER SURGERY       reports that he has never smoked. He has never used smokeless tobacco. He reports current alcohol use. He reports that he does not use drugs.  No Known  Allergies  Family History  Problem Relation Age of Onset  . Heart disease Mother   . Stroke Mother   . Diabetes Father   . Colon cancer Maternal Grandfather   . Esophageal cancer Neg Hx   . Rectal cancer Neg Hx   . Stomach cancer Neg Hx     Prior to Admission medications   Medication Sig Start Date End Date Taking? Authorizing Provider  allopurinol (ZYLOPRIM) 300 MG tablet Take 1 tablet (300 mg total) by mouth daily. 08/12/18   Silverio Decamp, MD  aspirin EC 81 MG tablet Take 1 tablet (81 mg total) by mouth daily. 08/11/18   Silverio Decamp, MD  levothyroxine (SYNTHROID, LEVOTHROID) 150 MCG tablet TAKE 1 TABLET BY MOUTH ONCE DAILY ** NEEDS OFFICE VISIT FOR ADDDITIONAL REFILLS** 08/27/17   Horald Pollen, MD  lisinopril (PRINIVIL,ZESTRIL) 10 MG tablet Take 1 tablet (10 mg total) by mouth daily. 08/11/18   Silverio Decamp, MD  MITIGARE 0.6 MG CAPS TAKE 1 CAPSULE BY MOUTH TWICE A DAY 07/07/18   Horald Pollen, MD  testosterone cypionate (DEPOTESTOSTERONE CYPIONATE) 200 MG/ML injection Inject 0.6 mLs (120 mg total) into the muscle every 7 (seven) days. Pls include 18g needle for drawing up, 23g needle for injection. 08/17/18   Silverio Decamp, MD    Physical Exam: Vitals:   08/23/18 0043 08/23/18 0051  BP: (!) 152/114 (!) 159/112  Pulse: 98 (!) 101  Resp: 18 18  Temp: 99.5 F (37.5 C) 99.6  F (37.6 C)  TempSrc:  Oral  SpO2: 96% 98%  Weight: 104.3 kg   Height: '6\' 3"'$  (1.905 m)       Constitutional: NAD, calm, comfortable Vitals:   08/23/18 0043 08/23/18 0051  BP: (!) 152/114 (!) 159/112  Pulse: 98 (!) 101  Resp: 18 18  Temp: 99.5 F (37.5 C) 99.6 F (37.6 C)  TempSrc:  Oral  SpO2: 96% 98%  Weight: 104.3 kg   Height: '6\' 3"'$  (1.905 m)    Eyes: PERRL, lids and conjunctivae normal ENMT: Mucous membranes are moist. Posterior pharynx clear of any exudate or lesions.Normal dentition.  Neck: normal, supple, no masses, no  thyromegaly Respiratory: clear to auscultation bilaterally, no wheezing, no crackles. Normal respiratory effort. No accessory muscle use.  Cardiovascular: Regular rate and rhythm, no murmurs / rubs / gallops. No extremity edema. 2+ pedal pulses. No carotid bruits.  Abdomen: no tenderness, no masses palpated. No hepatosplenomegaly. Bowel sounds positive.  Musculoskeletal: no clubbing / cyanosis. No joint deformity upper and lower extremities. Good ROM, no contractures. Normal muscle tone.  Skin: Right lower extremity with 3+ edema up to the knee skin incision at the right knee looks clean dry and intact with no purulent discharge below the knee there is significant erythema from below the knee to the ankle that is associated with the swelling consistent with cellulitis there are no areas of fluctuance or induration suggestive of abscess  neurologic: CN 2-12 grossly intact. Sensation intact, DTR normal. Strength 5/5 in all 4.  Psychiatric: Normal judgment and insight. Alert and oriented x 3. Normal mood.    Labs on Admission: I have personally reviewed following labs and imaging studies  CBC: Recent Labs  Lab 08/23/18 0124  WBC 12.3*  NEUTROABS 8.7*  HGB 12.4*  HCT 38.7*  MCV 89.8  PLT 854*   Basic Metabolic Panel: Recent Labs  Lab 08/23/18 0124  NA 140  K 3.6  CL 106  CO2 25  GLUCOSE 123*  BUN 15  CREATININE 1.59*  CALCIUM 8.7*   GFR: Estimated Creatinine Clearance: 68.6 mL/min (A) (by C-G formula based on SCr of 1.59 mg/dL (H)). Liver Function Tests: Recent Labs  Lab 08/23/18 0124  AST 53*  ALT 74*  ALKPHOS 69  BILITOT 1.7*  PROT 7.0  ALBUMIN 3.1*   No results for input(s): LIPASE, AMYLASE in the last 168 hours. No results for input(s): AMMONIA in the last 168 hours. Coagulation Profile: No results for input(s): INR, PROTIME in the last 168 hours. Cardiac Enzymes: No results for input(s): CKTOTAL, CKMB, CKMBINDEX, TROPONINI in the last 168 hours. BNP (last 3  results) No results for input(s): PROBNP in the last 8760 hours. HbA1C: No results for input(s): HGBA1C in the last 72 hours. CBG: No results for input(s): GLUCAP in the last 168 hours. Lipid Profile: No results for input(s): CHOL, HDL, LDLCALC, TRIG, CHOLHDL, LDLDIRECT in the last 72 hours. Thyroid Function Tests: No results for input(s): TSH, T4TOTAL, FREET4, T3FREE, THYROIDAB in the last 72 hours. Anemia Panel: No results for input(s): VITAMINB12, FOLATE, FERRITIN, TIBC, IRON, RETICCTPCT in the last 72 hours. Urine analysis:    Component Value Date/Time   BILIRUBINUR negative 04/29/2016 1010   KETONESUR negative 04/29/2016 1010   PROTEINUR negative 04/29/2016 1010   UROBILINOGEN 0.2 04/29/2016 1010   NITRITE Negative 04/29/2016 1010   LEUKOCYTESUR Negative 04/29/2016 1010   Sepsis Labs: !!!!!!!!!!!!!!!!!!!!!!!!!!!!!!!!!!!!!!!!!!!! '@LABRCNTIP'$ (procalcitonin:4,lacticidven:4) )No results found for this or any previous visit (from the past 240 hour(s)).  Radiological Exams on Admission: Vas [place] Lower Extremity Venous (dvt)  Result Date: 08/21/2018  Lower Venous Study Indications: Pain, Swelling, Recent knee replacement x8 days, and Erythema.  Performing Technologist: Maudry Mayhew MHA, RDMS, RVT, RDCS  Examination Guidelines: A complete evaluation includes B-mode imaging, spectral Doppler, color Doppler, and power Doppler as needed of all accessible portions of each vessel. Bilateral testing is considered an integral part of a complete examination. Limited examinations for reoccurring indications may be performed as noted.  Right Venous Findings: +---------+---------------+---------+-----------+----------+-------+          CompressibilityPhasicitySpontaneityPropertiesSummary +---------+---------------+---------+-----------+----------+-------+ CFV      Full           Yes      Yes                          +---------+---------------+---------+-----------+----------+-------+  SFJ      Full                                                 +---------+---------------+---------+-----------+----------+-------+ FV Prox  Full                                                 +---------+---------------+---------+-----------+----------+-------+ FV Mid   Full                                                 +---------+---------------+---------+-----------+----------+-------+ FV DistalFull                                                 +---------+---------------+---------+-----------+----------+-------+ PFV      Full                                                 +---------+---------------+---------+-----------+----------+-------+ POP      Full           Yes      Yes                          +---------+---------------+---------+-----------+----------+-------+ PTV      Full                                                 +---------+---------------+---------+-----------+----------+-------+ PERO     Full                                                 +---------+---------------+---------+-----------+----------+-------+  Left Venous Findings: +---+---------------+---------+-----------+----------+-------+    CompressibilityPhasicitySpontaneityPropertiesSummary +---+---------------+---------+-----------+----------+-------+ CFVFull  Yes      Yes                          +---+---------------+---------+-----------+----------+-------+    Summary: Right: There is no evidence of deep vein thrombosis in the lower extremity. A cystic structure is found in the popliteal fossa. Ultrasound characteristics of enlarged lymph nodes are noted in the groin. Left: No evidence of common femoral vein obstruction.  *See table(s) above for measurements and observations. Electronically signed by Monica Martinez MD on 08/21/2018 at 12:28:19 PM.    Final     Old chart reviewed Case discussed with Ms. Judeen Hammans PA in the emergency  department  Assessment/Plan 58 year old male status post total knee replacement on the right January 2020 comes in with postoperative cellulitis to the right lower extremity  Principal Problem:   Cellulitis-has been on Keflex for 24 hours.  Placed on vancomycin and Zosyn at this time.  Elevate leg.  Orthopedic surgery has been called and will see patient in consultation.  Patient had ultrasound of right lower extremity ruling out DVT yesterday..  And oxycodone and Toradol for pain.  Active Problems:   S/P TKR (total knee replacement), right  1/20-placed on prophylactic dosing of Lovenox.  Elevate knee.  Obtain orthopedic surgery consultation for postoperative infection.    Benign essential hypertension-clarify and resume home meds    Renal insufficiency, mild-creatinine mildly elevated at 1.5.  Give a liter of normal saline fluid.  Monitor closely while patient is on Toradol for pain.   Med rec pending pharmacy review  DVT prophylaxis: Lovenox Code Status: Full Family Communication: None Disposition Plan: Days Consults called: Orthopedic surgery Admission status: Admission   Harvy Riera A MD [facility]  If 7PM-7AM, please contact night-coverage [email or web address] Password [email or web address]  08/23/2018, 5:25 AM

## 2018-08-23 NOTE — Progress Notes (Signed)
PROGRESS NOTE    Jeremiah Webb  SLH:[id number] DOB: [birth year] DOA: 08/23/2018 PCP: Monica Becton, MD   Brief Narrative:  58 yo male with pmhx of right sided knee replacement on Jan 2,2020, HypoTSH, HTN, came with complaints of worsening right sided knee swelling, erythema and Pain. Seen by ortho outpatient was placed Keflex and did LE Dopplers which was neg. Due to worsening of swelling, he came to the ED.    Assessment & Plan:   Principal Problem:   Cellulitis Active Problems:   Benign essential hypertension   S/P TKR (total knee replacement), right  1/20   Renal insufficiency, mild  Extensive right lower extremity cellulitis and swelling around the surgical site of the right knee Status post right-sided total knee replacement on 08/13/2018 - Given the extent of the infection especially at the surgical site, will continue vancomycin and Zosyn. -Pain control, lower extremity Dopplers is negative for DVT - Spoke with Dr. Yevette Edwards from orthopedic who will evaluate the patient today.  Defer the need for advanced imaging to orthopedic.  Hypothyroidism -Continue Synthroid  Essential hypertension -Continue lisinopril  Hypogonadism -On outpatient weekly testosterone.  DVT prophylaxis: Lovenox Code Status: Full code Family Communication: None at bedside Disposition Plan: Maintain inpatient stay for IV antibiotics and further management of his extensive cellulitis  Consultants:   [facility]   Procedures:   None during this hospitalization  Antimicrobials:   Vancomycin 1/12  Zosyn 1/11   Subjective: Patient seen and examined at the bedside, he reports of extensive amount of right lower extremity pain and swelling extending from his knee down to his toes.  It appears quite swollen and very tender to touch.  Review of Systems Otherwise negative except as per HPI, including: General: Denies fever, chills, night sweats or unintended weight  loss. Resp: Denies cough, wheezing, shortness of breath. Cardiac: Denies chest pain, palpitations, orthopnea, paroxysmal nocturnal dyspnea. GI: Denies abdominal pain, nausea, vomiting, diarrhea or constipation GU: Denies dysuria, frequency, hesitancy or incontinence MS: Right lower extremity pain Neuro: Denies headache, neurologic deficits (focal weakness, numbness, tingling), abnormal gait Psych: Denies anxiety, depression, SI/HI/AVH Skin: Denies new rashes or lesions ID: Denies sick contacts, exotic exposures, travel  Objective: Vitals:   08/23/18 0538 08/23/18 0545 08/23/18 0600 08/23/18 0650  BP: (!) 161/97 (!) 168/93 (!) 163/96 (!) 147/65  Pulse: 89 84 83 88  Resp:    18  Temp:    98.9 F (37.2 C)  TempSrc:    Oral  SpO2: 98% 99% 98% 100%  Weight:      Height:        Intake/Output Summary (Last 24 hours) at 08/23/2018 0923 Last data filed at 08/23/2018 0406 Gross per 24 hour  Intake 494.21 ml  Output -  Net 494.21 ml   Filed Weights   08/23/18 0043  Weight: 104.3 kg    Examination:  General exam: Appears calm and comfortable  Respiratory system: Clear to auscultation. Respiratory effort normal. Cardiovascular system: S1 & S2 heard, RRR. No JVD, murmurs, rubs, gallops or clicks. No pedal edema. Gastrointestinal system: Abdomen is nondistended, soft and nontender. No organomegaly or masses felt. Normal bowel sounds heard. Central nervous system: Alert and oriented. No focal neurological deficits. Extremities: Symmetric 5 x 5 power. Skin: Right lower extremity surgical scar noted of his right knee with surrounding erythema and swelling extending all the way down to his toes.  It is extremely tender to touch as well. Psychiatry: Judgement and insight appear normal. Mood &  affect appropriate.     Data Reviewed:   CBC: Recent Labs  Lab 08/23/18 0124  WBC 12.3*  NEUTROABS 8.7*  HGB 12.4*  HCT 38.7*  MCV 89.8  PLT 467*   Basic Metabolic Panel: Recent Labs   Lab 08/23/18 0124  NA 140  K 3.6  CL 106  CO2 25  GLUCOSE 123*  BUN 15  CREATININE 1.59*  CALCIUM 8.7*   GFR: Estimated Creatinine Clearance: 68.6 mL/min (A) (by C-G formula based on SCr of 1.59 mg/dL (H)). Liver Function Tests: Recent Labs  Lab 08/23/18 0124  AST 53*  ALT 74*  ALKPHOS 69  BILITOT 1.7*  PROT 7.0  ALBUMIN 3.1*   No results for input(s): LIPASE, AMYLASE in the last 168 hours. No results for input(s): AMMONIA in the last 168 hours. Coagulation Profile: No results for input(s): INR, PROTIME in the last 168 hours. Cardiac Enzymes: No results for input(s): CKTOTAL, CKMB, CKMBINDEX, TROPONINI in the last 168 hours. BNP (last 3 results) No results for input(s): PROBNP in the last 8760 hours. HbA1C: No results for input(s): HGBA1C in the last 72 hours. CBG: No results for input(s): GLUCAP in the last 168 hours. Lipid Profile: No results for input(s): CHOL, HDL, LDLCALC, TRIG, CHOLHDL, LDLDIRECT in the last 72 hours. Thyroid Function Tests: No results for input(s): TSH, T4TOTAL, FREET4, T3FREE, THYROIDAB in the last 72 hours. Anemia Panel: No results for input(s): VITAMINB12, FOLATE, FERRITIN, TIBC, IRON, RETICCTPCT in the last 72 hours. Sepsis Labs: Recent Labs  Lab 08/23/18 0124 08/23/18 0321  LATICACIDVEN 0.8 0.7    No results found for this or any previous visit (from the past 240 hour(s)).       Radiology Studies: Vas [place] Lower Extremity Venous (dvt)  Result Date: 08/21/2018  Lower Venous Study Indications: Pain, Swelling, Recent knee replacement x8 days, and Erythema.  Performing Technologist: Gertie Fey MHA, RDMS, RVT, RDCS  Examination Guidelines: A complete evaluation includes B-mode imaging, spectral Doppler, color Doppler, and power Doppler as needed of all accessible portions of each vessel. Bilateral testing is considered an integral part of a complete examination. Limited examinations for reoccurring indications may be performed  as noted.  Right Venous Findings: +---------+---------------+---------+-----------+----------+-------+          CompressibilityPhasicitySpontaneityPropertiesSummary +---------+---------------+---------+-----------+----------+-------+ CFV      Full           Yes      Yes                          +---------+---------------+---------+-----------+----------+-------+ SFJ      Full                                                 +---------+---------------+---------+-----------+----------+-------+ FV Prox  Full                                                 +---------+---------------+---------+-----------+----------+-------+ FV Mid   Full                                                 +---------+---------------+---------+-----------+----------+-------+  FV DistalFull                                                 +---------+---------------+---------+-----------+----------+-------+ PFV      Full                                                 +---------+---------------+---------+-----------+----------+-------+ POP      Full           Yes      Yes                          +---------+---------------+---------+-----------+----------+-------+ PTV      Full                                                 +---------+---------------+---------+-----------+----------+-------+ PERO     Full                                                 +---------+---------------+---------+-----------+----------+-------+  Left Venous Findings: +---+---------------+---------+-----------+----------+-------+    CompressibilityPhasicitySpontaneityPropertiesSummary +---+---------------+---------+-----------+----------+-------+ CFVFull           Yes      Yes                          +---+---------------+---------+-----------+----------+-------+    Summary: Right: There is no evidence of deep vein thrombosis in the lower extremity. A cystic structure is found in the  popliteal fossa. Ultrasound characteristics of enlarged lymph nodes are noted in the groin. Left: No evidence of common femoral vein obstruction.  *See table(s) above for measurements and observations. Electronically signed by Sherald Hess MD on 08/21/2018 at 12:28:19 PM.    Final         Scheduled Meds: . aspirin EC  81 mg Oral Daily  . enoxaparin (LOVENOX) injection  40 mg Subcutaneous Q24H  . lisinopril  10 mg Oral Daily  . sodium chloride flush  3 mL Intravenous Q12H   Continuous Infusions: . sodium chloride    . piperacillin-tazobactam (ZOSYN)  IV    . vancomycin       LOS: 0 days   Time spent= 25 mins    Jeremiah Depaoli Joline Maxcy, MD [facility]  If 7PM-7AM, please contact night-coverage [email or web address] 08/23/2018, 9:23 AM

## 2018-08-24 ENCOUNTER — Other Ambulatory Visit: Payer: Self-pay | Admitting: Orthopaedic Surgery

## 2018-08-24 DIAGNOSIS — Z96651 Presence of right artificial knee joint: Secondary | ICD-10-CM

## 2018-08-24 DIAGNOSIS — L03115 Cellulitis of right lower limb: Principal | ICD-10-CM

## 2018-08-24 DIAGNOSIS — M109 Gout, unspecified: Secondary | ICD-10-CM

## 2018-08-24 DIAGNOSIS — I10 Essential (primary) hypertension: Secondary | ICD-10-CM

## 2018-08-24 DIAGNOSIS — N289 Disorder of kidney and ureter, unspecified: Secondary | ICD-10-CM

## 2018-08-24 LAB — BASIC METABOLIC PANEL
Anion gap: 7 (ref 5–15)
BUN: 19 mg/dL (ref 6–20)
CO2: 23 mmol/L (ref 22–32)
CREATININE: 1.63 mg/dL — AB (ref 0.61–1.24)
Calcium: 8.4 mg/dL — ABNORMAL LOW (ref 8.9–10.3)
Chloride: 107 mmol/L (ref 98–111)
GFR calc Af Amer: 54 mL/min — ABNORMAL LOW (ref >60)
GFR calc non Af Amer: 47 mL/min — ABNORMAL LOW (ref >60)
Glucose, Bld: 137 mg/dL — ABNORMAL HIGH (ref 70–99)
Potassium: 4.4 mmol/L (ref 3.5–5.1)
Sodium: 137 mmol/L (ref 135–145)

## 2018-08-24 LAB — CBC
HCT: 37 % — ABNORMAL LOW (ref 39.0–52.0)
Hemoglobin: 12 g/dL — ABNORMAL LOW (ref 13.0–17.0)
MCH: 28.6 pg (ref 26.0–34.0)
MCHC: 32.4 g/dL (ref 30.0–36.0)
MCV: 88.1 fL (ref 80.0–100.0)
Platelets: 431 10*3/uL — ABNORMAL HIGH (ref 150–400)
RBC: 4.2 MIL/uL — ABNORMAL LOW (ref 4.22–5.81)
RDW: 12.1 % (ref 11.5–15.5)
WBC: 12.8 10*3/uL — ABNORMAL HIGH (ref 4.0–10.5)
nRBC: 0 % (ref 0.0–0.2)

## 2018-08-24 MED ORDER — CEPHALEXIN 500 MG PO CAPS: 500.0000 mg | ORAL_CAPSULE | Freq: Three times a day (TID) | ORAL | 0 refills | Status: AC

## 2018-08-24 MED ORDER — OXYCODONE HCL 5 MG PO TABS: 5.0000 mg | ORAL_TABLET | ORAL | 0 refills | Status: DC | PRN

## 2018-08-24 MED ORDER — DOXYCYCLINE HYCLATE 100 MG PO TABS
100.0000 mg | ORAL_TABLET | Freq: Two times a day (BID) | ORAL | Status: DC
  Administered 2018-08-24: 100 mg via ORAL
  Filled 2018-08-24: qty 1

## 2018-08-24 MED ORDER — DOXYCYCLINE HYCLATE 100 MG PO TABS: 100.0000 mg | ORAL_TABLET | Freq: Two times a day (BID) | ORAL | 0 refills | Status: AC

## 2018-08-24 MED ORDER — CEPHALEXIN 500 MG PO CAPS
500.0000 mg | ORAL_CAPSULE | Freq: Three times a day (TID) | ORAL | Status: DC
  Administered 2018-08-24: 500 mg via ORAL
  Filled 2018-08-24: qty 1

## 2018-08-24 MED ORDER — PREDNISONE 20 MG PO TABS: 40.0000 mg | ORAL_TABLET | Freq: Every day | ORAL | Status: DC

## 2018-08-24 MED ORDER — PREDNISONE 20 MG PO TABS: 40.0000 mg | ORAL_TABLET | Freq: Every day | ORAL | 0 refills | Status: AC

## 2018-08-24 NOTE — Progress Notes (Signed)
PROGRESS NOTE    Jeremiah Webb  ONG:[id number] DOB: [birth year] DOA: 08/23/2018 PCP: Monica Becton, MD   Brief Narrative:  58 yo male with pmhx of right sided knee replacement on Jan 2,2020, HypoTSH, HTN, came with complaints of worsening right sided knee swelling, erythema and Pain. Seen by ortho outpatient was placed Keflex and did LE Dopplers which was neg. Due to worsening of swelling, he came to the ED. patient was seen by orthopedic service and recommended continue medical management with IV antibiotics.  His uric acid level was slightly elevated and patient insisted on treating his gout given his previous history therefore started on oral prednisone.   Assessment & Plan:   Principal Problem:   Cellulitis Active Problems:   Benign essential hypertension   S/P TKR (total knee replacement), right  1/20   Renal insufficiency, mild  Extensive right lower extremity cellulitis and swelling around the surgical site of the right knee, improved Status post right-sided total knee replacement on 08/13/2018 - Patient's infection appears to have improved as the swelling has improved but still has pain.  We will continue vancomycin and Zosyn.  Orthopedic to consult infectious disease for their input. -Pain control, lower extremity Dopplers is negative for DVT  Acute gout flare; right lower extremity toes -Slightly elevated uric acid levels.  Will treat with prednisone per patient's request.  Hypothyroidism -Continue Synthroid  Essential hypertension -Continue lisinopril  Hypogonadism -On outpatient weekly testosterone.  DVT prophylaxis: Lovenox Code Status: Full code Family Communication: None at bedside Disposition Plan: Maintain inpatient stay for IV antibiotics and further recommendations by infectious disease.  Consultants:   [facility]   Infectious diseases to be consulted by orthopedic  Procedures:   None during this  hospitalization  Antimicrobials:   Vancomycin 1/12  Zosyn 1/11   Subjective: Patient states his right lower extremity pain has improved from yesterday and is able to tolerate more weight now.  Review of Systems Otherwise negative except as per HPI, including: General = no fevers, chills, dizziness, malaise, fatigue HEENT/EYES = negative for pain, redness, loss of vision, double vision, blurred vision, loss of hearing, sore throat, hoarseness, dysphagia Cardiovascular= negative for chest pain, palpitation, murmurs, lower extremity swelling Respiratory/lungs= negative for shortness of breath, cough, hemoptysis, wheezing, mucus production Gastrointestinal= negative for nausea, vomiting,, abdominal pain, melena, hematemesis Genitourinary= negative for Dysuria, Hematuria, Change in Urinary Frequency MSK = Negative for arthralgia, myalgias, Back Pain, Joint swelling  Neurology= Negative for headache, seizures, numbness, tingling  Psychiatry= Negative for anxiety, depression, suicidal and homocidal ideation Allergy/Immunology= Medication/Food allergy as listed  Skin= Negative for Rash, lesions, ulcers, itching  Objective: Vitals:   08/23/18 2158 08/24/18 0155 08/24/18 0500 08/24/18 0617  BP: (!) 148/86   (!) 137/91  Pulse: 84   72  Resp:  18    Temp: 98.7 F (37.1 C)   98.5 F (36.9 C)  TempSrc: Oral   Oral  SpO2: 98%   98%  Weight:   107 kg   Height:        Intake/Output Summary (Last 24 hours) at 08/24/2018 1033 Last data filed at 08/24/2018 0618 Gross per 24 hour  Intake 1667.53 ml  Output 1850 ml  Net -182.47 ml   Filed Weights   08/23/18 0043 08/24/18 0500  Weight: 104.3 kg 107 kg    Examination:  Constitutional: NAD, calm, comfortable Eyes: PERRL, lids and conjunctivae normal ENMT: Mucous membranes are moist. Posterior pharynx clear of any exudate or lesions.Normal dentition.  Neck: normal, supple, no masses, no thyromegaly Respiratory: clear to auscultation  bilaterally, no wheezing, no crackles. Normal respiratory effort. No accessory muscle use.  Cardiovascular: Regular rate and rhythm, no murmurs / rubs / gallops. No extremity edema. 2+ pedal pulses. No carotid bruits.  Abdomen: no tenderness, no masses palpated. No hepatosplenomegaly. Bowel sounds positive.  Musculoskeletal: no clubbing / cyanosis. No joint deformity upper and lower extremities. Good ROM, no contractures. Normal muscle tone.  Skin: Right lower extremity erythema, swelling noted.  Warm to touch-improved from yesterday.  Surgical dressing in place which appears to be dry and clean. Neurologic: CN 2-12 grossly intact. Sensation intact, DTR normal. Strength 5/5 in all 4.  Psychiatric: Normal judgment and insight. Alert and oriented x 3. Normal mood.    Data Reviewed:   CBC: Recent Labs  Lab 08/23/18 0124 08/24/18 0248  WBC 12.3* 12.8*  NEUTROABS 8.7*  --   HGB 12.4* 12.0*  HCT 38.7* 37.0*  MCV 89.8 88.1  PLT 467* 431*   Basic Metabolic Panel: Recent Labs  Lab 08/23/18 0124 08/24/18 0248  NA 140 137  K 3.6 4.4  CL 106 107  CO2 25 23  GLUCOSE 123* 137*  BUN 15 19  CREATININE 1.59* 1.63*  CALCIUM 8.7* 8.4*   GFR: Estimated Creatinine Clearance: 67.7 mL/min (A) (by C-G formula based on SCr of 1.63 mg/dL (H)). Liver Function Tests: Recent Labs  Lab 08/23/18 0124  AST 53*  ALT 74*  ALKPHOS 69  BILITOT 1.7*  PROT 7.0  ALBUMIN 3.1*   No results for input(s): LIPASE, AMYLASE in the last 168 hours. No results for input(s): AMMONIA in the last 168 hours. Coagulation Profile: No results for input(s): INR, PROTIME in the last 168 hours. Cardiac Enzymes: No results for input(s): CKTOTAL, CKMB, CKMBINDEX, TROPONINI in the last 168 hours. BNP (last 3 results) No results for input(s): PROBNP in the last 8760 hours. HbA1C: No results for input(s): HGBA1C in the last 72 hours. CBG: No results for input(s): GLUCAP in the last 168 hours. Lipid Profile: No  results for input(s): CHOL, HDL, LDLCALC, TRIG, CHOLHDL, LDLDIRECT in the last 72 hours. Thyroid Function Tests: No results for input(s): TSH, T4TOTAL, FREET4, T3FREE, THYROIDAB in the last 72 hours. Anemia Panel: No results for input(s): VITAMINB12, FOLATE, FERRITIN, TIBC, IRON, RETICCTPCT in the last 72 hours. Sepsis Labs: Recent Labs  Lab 08/23/18 0124 08/23/18 0321  LATICACIDVEN 0.8 0.7    Recent Results (from the past 240 hour(s))  Culture, blood (routine x 2)     Status: None (Preliminary result)   Collection Time: 08/23/18  1:30 AM  Result Value Ref Range Status   Specimen Description BLOOD RIGHT ANTECUBITAL  Final   Special Requests   Final    BOTTLES DRAWN AEROBIC AND ANAEROBIC Blood Culture results may not be optimal due to an excessive volume of blood received in culture bottles   Culture   Final    NO GROWTH 1 DAY Performed at [facility], [address] [place], [place] [address]    Report Status PENDING  Incomplete  Culture, blood (routine x 2)     Status: None (Preliminary result)   Collection Time: 08/23/18  1:35 AM  Result Value Ref Range Status   Specimen Description BLOOD RIGHT ANTECUBITAL  Final   Special Requests   Final    BOTTLES DRAWN AEROBIC ONLY Blood Culture results may not be optimal due to an excessive volume of blood received in culture bottles  Culture   Final    NO GROWTH 1 DAY Performed at [facility], [address] [place], [place] [address]    Report Status PENDING  Incomplete         Radiology Studies: No results found.      Scheduled Meds: . aspirin EC  81 mg Oral Daily  . enoxaparin (LOVENOX) injection  40 mg Subcutaneous Q24H  . levothyroxine  150 mcg Oral QAC breakfast  . lisinopril  10 mg Oral Daily  . multivitamin with minerals  1 tablet Oral Daily  . predniSONE  40 mg Oral Daily  . sodium chloride flush  3 mL Intravenous Q12H   Continuous Infusions: . sodium chloride    . piperacillin-tazobactam  (ZOSYN)  IV 3.375 g (08/24/18 0800)  . vancomycin 2,000 mg (08/23/18 2224)     LOS: 1 day   Time spent= 25 mins    Rito Lecomte Joline Maxcy, MD [facility]  If 7PM-7AM, please contact night-coverage [email or web address] 08/24/2018, 10:33 AM

## 2018-08-24 NOTE — Consult Note (Signed)
[facility]    Date of Admission:  08/23/2018   Total days of antibiotics 4        Day 3 vancomycin        Day 3 piperacillin tazobactam              Reason for Consult: Right leg cellulitis    Referring Provider: Elodia Florence, PA  Assessment: He has acute right lower leg cellulitis is improving on broad empiric antibiotic therapy.  There is no evidence of involvement with his new right prosthetic knee.  I recommend converting back to oral with cephalexin and doxycycline.  Plan: 1. Recommend discharge home on cephalexin 500 mg 3 times a day and doxycycline 100 mg twice a day for 5 days.  Principal Problem:   Cellulitis Active Problems:   S/P TKR (total knee replacement), right  1/20   Benign essential hypertension   Renal insufficiency, mild   Scheduled Meds: . aspirin EC  81 mg Oral Daily  . cephALEXin  500 mg Oral Q8H  . doxycycline  100 mg Oral Q12H  . enoxaparin (LOVENOX) injection  40 mg Subcutaneous Q24H  . levothyroxine  150 mcg Oral QAC breakfast  . lisinopril  10 mg Oral Daily  . multivitamin with minerals  1 tablet Oral Daily  . [START ON 08/25/2018] predniSONE  40 mg Oral Daily  . sodium chloride flush  3 mL Intravenous Q12H   Continuous Infusions: . sodium chloride     PRN Meds:.sodium chloride, acetaminophen **OR** acetaminophen, ketorolac, oxyCODONE, sodium chloride flush  HPI: Jeremiah Webb is a 58 y.o. male who underwent right total knee arthroplasty on 08/13/2018.  He did well initially until 08/19/2018 when he had sudden onset of burning pain, swelling and redness from his right mid shin to his ankle.  He was seen in his orthopedic surgeon's office on 08/21/2018 and diagnosed with cellulitis.  Doppler showed no evidence of DVT.  He was started on empiric cephalexin but had such severe pain that he was admitted to the hospital 1 day later. He was started on empiric vancomycin and piperacillin tazobactam.  He was having fevers  at home but has defervesced and is feeling much better now.  He still has some discomfort in his right leg but it is much improved.  He has been walking in his room this morning.   Review of Systems: Review of Systems  Constitutional: Positive for chills, diaphoresis and fever.  Musculoskeletal: Positive for joint pain.  Skin: Positive for rash.    Past Medical History:  Diagnosis Date  . Benign essential hypertension 08/11/2018  . Hypothyroidism   . Thyroid disease    Hypothyroid    Social History   Tobacco Use  . Smoking status: Never Smoker  . Smokeless tobacco: Never Used  Substance Use Topics  . Alcohol use: Yes    Alcohol/week: 0.0 standard drinks    Comment: occasional  . Drug use: No    Family History  Problem Relation Age of Onset  . Heart disease Mother   . Stroke Mother   . Diabetes Father   . Colon cancer Maternal Grandfather   . Esophageal cancer Neg Hx   . Rectal cancer Neg Hx   . Stomach cancer Neg Hx    No Known Allergies  OBJECTIVE: Blood pressure (!) 137/91, pulse 72, temperature 98.5 F (36.9 C), temperature source Oral, resp. rate 18, height 6\' 3"  (1.905 m), weight  107 kg, SpO2 98 %.  Physical Exam Constitutional:      Comments: He is resting quietly in bed.  He is in good spirits.  Musculoskeletal: Normal range of motion.        General: No swelling.     Right lower leg: Edema present.     Comments: He has a clean dressing over his right knee incision.  There is no unusual swelling redness or warmth of his right knee.  He has good range of motion.  He has pitting edema from mid shin to ankle with some fading erythema and warmth.  Psychiatric:        Mood and Affect: Mood normal.     Lab Results Lab Results  Component Value Date   WBC 12.8 (H) 08/24/2018   HGB 12.0 (L) 08/24/2018   HCT 37.0 (L) 08/24/2018   MCV 88.1 08/24/2018   PLT 431 (H) 08/24/2018    Lab Results  Component Value Date   CREATININE 1.63 (H) 08/24/2018   BUN  19 08/24/2018   NA 137 08/24/2018   K 4.4 08/24/2018   CL 107 08/24/2018   CO2 23 08/24/2018    Lab Results  Component Value Date   ALT 74 (H) 08/23/2018   AST 53 (H) 08/23/2018   ALKPHOS 69 08/23/2018   BILITOT 1.7 (H) 08/23/2018     Microbiology: Recent Results (from the past 240 hour(s))  Culture, blood (routine x 2)     Status: None (Preliminary result)   Collection Time: 08/23/18  1:30 AM  Result Value Ref Range Status   Specimen Description BLOOD RIGHT ANTECUBITAL  Final   Special Requests   Final    BOTTLES DRAWN AEROBIC AND ANAEROBIC Blood Culture results may not be optimal due to an excessive volume of blood received in culture bottles   Culture   Final    NO GROWTH 1 DAY Performed at [facility], [address], [place], [place] [address]    Report Status PENDING  Incomplete  Culture, blood (routine x 2)     Status: None (Preliminary result)   Collection Time: 08/23/18  1:35 AM  Result Value Ref Range Status   Specimen Description BLOOD RIGHT ANTECUBITAL  Final   Special Requests   Final    BOTTLES DRAWN AEROBIC ONLY Blood Culture results may not be optimal due to an excessive volume of blood received in culture bottles   Culture   Final    NO GROWTH 1 DAY Performed at [facility], [address] [place], [place] [address]    Report Status PENDING  Incomplete    Cliffton Asters, MD [facility] [phone] pager   [phone] cell 08/24/2018, 11:07 AM

## 2018-08-24 NOTE — Care Plan (Signed)
Patient is part of an outpatient orthopedic bundle. He has been seen by [facility] at [facility] for [facility] and was transitioning to OPPT at [facility]. When admitted to service.  He has all needed equipment at home.  I will be glad to assist with any post acute needs. Please contact me with concerns.    Shauna Hugh, RNCM  [phone]

## 2018-08-24 NOTE — Progress Notes (Signed)
Pt started having small amount of serosanguinous drainage from the distal aspect of his knee incision after ambulating in his room. Area cleaned and pink foam applied. He states his leg is feeling much better this morning.

## 2018-08-24 NOTE — Discharge Summary (Signed)
Physician Discharge Summary  Jeremiah Webb TFT:[id number] DOB: [birth year] DOA: 08/23/2018  PCP: Monica Becton, MD  Admit date: 08/23/2018 Discharge date: 08/24/2018  Admitted From: Home home Disposition:    Recommendations for Outpatient Follow-up:  1. Follow up with PCP in 1-2 weeks 2. Please obtain BMP/CBC in one week your next doctors visit.  3. Take oral cephalexin and doxycycline for 5 days 4. Follow-up outpatient with orthopedic in 1-2 days. 5. Oral prednisone for gout flare for 4 more days.  Discharge Condition: Stable CODE STATUS: Full code Diet recommendation: 2 g salt diet  Brief/Interim Summary: 58 yo male with pmhx of right sided knee replacement on Jan 2,2020, HypoTSH, HTN, came with complaints of worsening right sided knee swelling, erythema and Pain. Seen by ortho outpatient was placed Keflex and did LE Dopplers which was neg. Due to worsening of swelling, he came to the ED. patient was seen by orthopedic service and recommended continue medical management with IV antibiotics.  His uric acid level was slightly elevated and patient insisted on treating his gout given his previous history therefore started on oral prednisone. Infectious disease was consulted who recommended converting antibiotics to oral cephalexin and doxycycline for total of 5 days.  Patient needs to follow-up outpatient with orthopedic in about 1-2 days and primary care provider in about 1 week.   Discharge Diagnoses:  Principal Problem:   Cellulitis Active Problems:   Benign essential hypertension   S/P TKR (total knee replacement), right  1/20   Renal insufficiency, mild   Extensive right lower extremity cellulitis and swelling around the surgical site of the right knee, improved Status post right-sided total knee replacement on 08/13/2018 -  Erythema and swelling is doing better.  We will transition IV antibiotics to oral cephalexin and doxycycline for 5 more days and follow-up  outpatient with orthopedic in 1-2 days.  Appreciate input from infectious disease provider. -Pain control, lower extremity Dopplers is negative for DVT  Acute gout flare; right lower extremity toes -4 more days of oral prednisone..  Hypothyroidism -Continue Synthroid  Essential hypertension -Continue lisinopril  Hypogonadism -On outpatient weekly testosterone.  Patient on Lovenox during the hospitalization Discharge today in stable condition  Discharge Instructions   Allergies as of 08/24/2018   No Known Allergies     Medication List    TAKE these medications   allopurinol 300 MG tablet Commonly known as:  ZYLOPRIM Take 1 tablet (300 mg total) by mouth daily.   aspirin EC 81 MG tablet Take 1 tablet (81 mg total) by mouth daily.   cephALEXin 500 MG capsule Commonly known as:  KEFLEX Take 1 capsule (500 mg total) by mouth every 8 (eight) hours for 5 days.   doxycycline 100 MG tablet Commonly known as:  VIBRA-TABS Take 1 tablet (100 mg total) by mouth every 12 (twelve) hours for 5 days.   levothyroxine 150 MCG tablet Commonly known as:  SYNTHROID, LEVOTHROID TAKE 1 TABLET BY MOUTH ONCE DAILY ** NEEDS OFFICE VISIT FOR ADDDITIONAL REFILLS** What changed:  See the new instructions.   lisinopril 10 MG tablet Commonly known as:  PRINIVIL,ZESTRIL Take 1 tablet (10 mg total) by mouth daily.   MENS MULTIVITAMIN PO Take 1 tablet by mouth daily.   MITIGARE 0.6 MG Caps Generic drug:  Colchicine TAKE 1 CAPSULE BY MOUTH TWICE A DAY   oxyCODONE 5 MG immediate release tablet Commonly known as:  Oxy IR/ROXICODONE Take 1 tablet (5 mg total) by mouth every 4 (four) hours as  needed for moderate pain.   predniSONE 20 MG tablet Commonly known as:  DELTASONE Take 2 tablets (40 mg total) by mouth daily for 4 days. Start taking on:  August 25, 2018   testosterone cypionate 200 MG/ML injection Commonly known as:  DEPOTESTOSTERONE CYPIONATE Inject 0.6 mLs (120 mg total)  into the muscle every 7 (seven) days. Pls include 18g needle for drawing up, 23g needle for injection.      Follow-up Information    Monica Bectonhekkekandam, Thomas J, MD. Schedule an appointment as soon as possible for a visit in 1 week(s).   Specialties:  Family Medicine, Sports Medicine, Radiology Contact information: [phone] [place] [address] [place] [id number] [phone]        [facility]. Schedule an appointment as soon as possible for a visit in 1 day(s).   Contact information: [address] [place] [id number] [phone]         No Known Allergies  You were cared for by a hospitalist during your hospital stay. If you have any questions about your discharge medications or the care you received while you were in the hospital after you are discharged, you can call the unit and asked to speak with the hospitalist on call if the hospitalist that took care of you is not available. Once you are discharged, your primary care physician will handle any further medical issues. Please note that no refills for any discharge medications will be authorized once you are discharged, as it is imperative that you return to your primary care physician (or establish a relationship with a primary care physician if you do not have one) for your aftercare needs so that they can reassess your need for medications and monitor your lab values.  Consultations:  Orthopedic  Infectious disease   Procedures/Studies: Vas Koreas Lower Extremity Venous (dvt)  Result Date: 08/21/2018  Lower Venous Study Indications: Pain, Swelling, Recent knee replacement x8 days, and Erythema.  Performing Technologist: Gertie FeyMichelle Simonetti MHA, RDMS, RVT, RDCS  Examination Guidelines: A complete evaluation includes B-mode imaging, spectral Doppler, color Doppler, and power Doppler as needed of all accessible portions of each vessel. Bilateral testing is considered an integral part  of a complete examination. Limited examinations for reoccurring indications may be performed as noted.  Right Venous Findings: +---------+---------------+---------+-----------+----------+-------+          CompressibilityPhasicitySpontaneityPropertiesSummary +---------+---------------+---------+-----------+----------+-------+ CFV      Full           Yes      Yes                          +---------+---------------+---------+-----------+----------+-------+ SFJ      Full                                                 +---------+---------------+---------+-----------+----------+-------+ FV Prox  Full                                                 +---------+---------------+---------+-----------+----------+-------+ FV Mid   Full                                                 +---------+---------------+---------+-----------+----------+-------+  FV DistalFull                                                 +---------+---------------+---------+-----------+----------+-------+ PFV      Full                                                 +---------+---------------+---------+-----------+----------+-------+ POP      Full           Yes      Yes                          +---------+---------------+---------+-----------+----------+-------+ PTV      Full                                                 +---------+---------------+---------+-----------+----------+-------+ PERO     Full                                                 +---------+---------------+---------+-----------+----------+-------+  Left Venous Findings: +---+---------------+---------+-----------+----------+-------+    CompressibilityPhasicitySpontaneityPropertiesSummary +---+---------------+---------+-----------+----------+-------+ CFVFull           Yes      Yes                          +---+---------------+---------+-----------+----------+-------+    Summary: Right: There is no  evidence of deep vein thrombosis in the lower extremity. A cystic structure is found in the popliteal fossa. Ultrasound characteristics of enlarged lymph nodes are noted in the groin. Left: No evidence of common femoral vein obstruction.  *See table(s) above for measurements and observations. Electronically signed by Sherald Hesshristopher Clark MD on 08/21/2018 at 12:28:19 PM.    Final       Subjective: Feels better, his right lower extremity swelling and pain has improved quite a bit.     Discharge Exam: Vitals:   08/24/18 0617 08/24/18 1253  BP: (!) 137/91 (!) 150/95  Pulse: 72 94  Resp:  16  Temp: 98.5 F (36.9 C) 98.5 F (36.9 C)  SpO2: 98% 96%   Vitals:   08/24/18 0155 08/24/18 0500 08/24/18 0617 08/24/18 1253  BP:   (!) 137/91 (!) 150/95  Pulse:   72 94  Resp: 18   16  Temp:   98.5 F (36.9 C) 98.5 F (36.9 C)  TempSrc:   Oral Oral  SpO2:   98% 96%  Weight:  107 kg    Height:        General: Pt is alert, awake, not in acute distress Cardiovascular: RRR, S1/S2 +, no rubs, no gallops Respiratory: CTA bilaterally, no wheezing, no rhonchi Abdominal: Soft, NT, ND, bowel sounds + Extremities: no edema, no cyanosis Right lower extremity swelling and erythema has improved.  He is able to bear weight now.  Clean bandage in place at the site of surgical dressing.   The results of significant diagnostics from this hospitalization (including  imaging, microbiology, ancillary and laboratory) are listed below for reference.     Microbiology: Recent Results (from the past 240 hour(s))  Culture, blood (routine x 2)     Status: None (Preliminary result)   Collection Time: 08/23/18  1:30 AM  Result Value Ref Range Status   Specimen Description BLOOD RIGHT ANTECUBITAL  Final   Special Requests   Final    BOTTLES DRAWN AEROBIC AND ANAEROBIC Blood Culture results may not be optimal due to an excessive volume of blood received in culture bottles   Culture   Final    NO GROWTH 1  DAY Performed at [facility], [address], [place], [place] [address]    Report Status PENDING  Incomplete  Culture, blood (routine x 2)     Status: None (Preliminary result)   Collection Time: 08/23/18  1:35 AM  Result Value Ref Range Status   Specimen Description BLOOD RIGHT ANTECUBITAL  Final   Special Requests   Final    BOTTLES DRAWN AEROBIC ONLY Blood Culture results may not be optimal due to an excessive volume of blood received in culture bottles   Culture   Final    NO GROWTH 1 DAY Performed at [facility], [address], [place], [place] [address]    Report Status PENDING  Incomplete     Labs: BNP (last 3 results) No results for input(s): BNP in the last 8760 hours. Basic Metabolic Panel: Recent Labs  Lab 08/23/18 0124 08/24/18 0248  NA 140 137  K 3.6 4.4  CL 106 107  CO2 25 23  GLUCOSE 123* 137*  BUN 15 19  CREATININE 1.59* 1.63*  CALCIUM 8.7* 8.4*   Liver Function Tests: Recent Labs  Lab 08/23/18 0124  AST 53*  ALT 74*  ALKPHOS 69  BILITOT 1.7*  PROT 7.0  ALBUMIN 3.1*   No results for input(s): LIPASE, AMYLASE in the last 168 hours. No results for input(s): AMMONIA in the last 168 hours. CBC: Recent Labs  Lab 08/23/18 0124 08/24/18 0248  WBC 12.3* 12.8*  NEUTROABS 8.7*  --   HGB 12.4* 12.0*  HCT 38.7* 37.0*  MCV 89.8 88.1  PLT 467* 431*   Cardiac Enzymes: No results for input(s): CKTOTAL, CKMB, CKMBINDEX, TROPONINI in the last 168 hours. BNP: Invalid input(s): POCBNP CBG: No results for input(s): GLUCAP in the last 168 hours. D-Dimer No results for input(s): DDIMER in the last 72 hours. Hgb A1c No results for input(s): HGBA1C in the last 72 hours. Lipid Profile No results for input(s): CHOL, HDL, LDLCALC, TRIG, CHOLHDL, LDLDIRECT in the last 72 hours. Thyroid function studies No results for input(s): TSH, T4TOTAL, T3FREE, THYROIDAB in the last 72 hours.  Invalid input(s): FREET3 Anemia work up No results for  input(s): VITAMINB12, FOLATE, FERRITIN, TIBC, IRON, RETICCTPCT in the last 72 hours. Urinalysis    Component Value Date/Time   BILIRUBINUR negative 04/29/2016 1010   KETONESUR negative 04/29/2016 1010   PROTEINUR negative 04/29/2016 1010   UROBILINOGEN 0.2 04/29/2016 1010   NITRITE Negative 04/29/2016 1010   LEUKOCYTESUR Negative 04/29/2016 1010   Sepsis Labs Invalid input(s): PROCALCITONIN,  WBC,  LACTICIDVEN Microbiology Recent Results (from the past 240 hour(s))  Culture, blood (routine x 2)     Status: None (Preliminary result)   Collection Time: 08/23/18  1:30 AM  Result Value Ref Range Status   Specimen Description BLOOD RIGHT ANTECUBITAL  Final   Special Requests   Final    BOTTLES  DRAWN AEROBIC AND ANAEROBIC Blood Culture results may not be optimal due to an excessive volume of blood received in culture bottles   Culture   Final    NO GROWTH 1 DAY Performed at [facility], [address], [place], [place] [address]    Report Status PENDING  Incomplete  Culture, blood (routine x 2)     Status: None (Preliminary result)   Collection Time: 08/23/18  1:35 AM  Result Value Ref Range Status   Specimen Description BLOOD RIGHT ANTECUBITAL  Final   Special Requests   Final    BOTTLES DRAWN AEROBIC ONLY Blood Culture results may not be optimal due to an excessive volume of blood received in culture bottles   Culture   Final    NO GROWTH 1 DAY Performed at [facility], [address] [place], [place] [address]    Report Status PENDING  Incomplete     Time coordinating discharge:  I have spent 35 minutes face to face with the patient and on the ward discussing the patients care, assessment, plan and disposition with other care givers. >50% of the time was devoted counseling the patient about the risks and benefits of treatment/Discharge disposition and coordinating care.   SIGNED:   Dimple Nanas, MD  [facility] 08/24/2018, 2:57 PM   If  7PM-7AM, please contact night-coverage [email or web address]

## 2018-08-24 NOTE — Progress Notes (Signed)
RN gave pt discharge instructions pt is going home. prescriptions escribed to pharmacy. Pt has all equipment at home already

## 2018-08-24 NOTE — Progress Notes (Signed)
Subjective:   Right lower leg cellulitis  Patient is feeling better on IV abx. HE has no pain or swelling in his knee. He feels he is heading in the right direction.  Activity level:  wbat Diet tolerance:  ok Voiding:  ok Patient reports pain as mild.    Objective: Vital signs in last 24 hours: Temp:  [98.5 F (36.9 C)-99.1 F (37.3 C)] 98.5 F (36.9 C) (01/13 0617) Pulse Rate:  [72-93] 72 (01/13 0617) Resp:  [18] 18 (01/13 0155) BP: (137-156)/(84-91) 137/91 (01/13 0617) SpO2:  [98 %-100 %] 98 % (01/13 0617) Weight:  [155 kg] 107 kg (01/13 0500)  Labs: Recent Labs    08/23/18 0124 08/24/18 0248  HGB 12.4* 12.0*   Recent Labs    08/23/18 0124 08/24/18 0248  WBC 12.3* 12.8*  RBC 4.31 4.20*  HCT 38.7* 37.0*  PLT 467* 431*   Recent Labs    08/23/18 0124 08/24/18 0248  NA 140 137  K 3.6 4.4  CL 106 107  CO2 25 23  BUN 15 19  CREATININE 1.59* 1.63*  GLUCOSE 123* 137*  CALCIUM 8.7* 8.4*   No results for input(s): LABPT, INR in the last 72 hours.  Physical Exam:  Neurologically intact ABD soft Neurovascular intact Sensation intact distally Intact pulses distally Dorsiflexion/Plantar flexion intact Incision: dressing C/D/I and no drainage Compartment soft  Assessment/Plan: Right lower leg cellulitis    We greatly appreciate medical management. He seems to be improving on IV abx. We are going to consult infectious disease team for home management oral vs IV abx. HE will follow up in office 1 to 2 days after discharge.  We will continue to follow but no need for further imaging at this time.   Aleighya Mcanelly, Ginger Organ 08/24/2018, 9:59 AM

## 2018-08-25 DIAGNOSIS — M1711 Unilateral primary osteoarthritis, right knee: Secondary | ICD-10-CM | POA: Diagnosis not present

## 2018-08-26 DIAGNOSIS — M1711 Unilateral primary osteoarthritis, right knee: Secondary | ICD-10-CM | POA: Diagnosis not present

## 2018-08-27 DIAGNOSIS — M1711 Unilateral primary osteoarthritis, right knee: Secondary | ICD-10-CM | POA: Diagnosis not present

## 2018-08-28 ENCOUNTER — Telehealth: Payer: Self-pay | Admitting: *Deleted

## 2018-08-28 ENCOUNTER — Encounter: Payer: Self-pay | Admitting: Sports Medicine

## 2018-08-28 LAB — CULTURE, BLOOD (ROUTINE X 2)
CULTURE: NO GROWTH
Culture: NO GROWTH

## 2018-08-28 NOTE — Telephone Encounter (Signed)
Yes, that is fine. 

## 2018-08-28 NOTE — Telephone Encounter (Signed)
Can plug in with someone next week. I will be in [facility] Monday, then full day Tues and half day WEd then I am out of town rest of woeek next week

## 2018-08-28 NOTE — Telephone Encounter (Signed)
Tammy Sours, will you be ok seeing this patient Monday?

## 2018-08-28 NOTE — Progress Notes (Deleted)
 Subjective:    Patient ID: Jeremiah Webb, male    DOB: 01/11/1961, 58 y.o.   MRN: [id number]  No chief complaint on file.   HPI:  Jeremiah Webb is a 58 y.o. male who presents today for office visit following hospitalization.  Mr. Weimann was recently admitted to [facility] with acute onset burning pain, swelling and redness from his right mid shin to ankle in the setting of recent right total knee arthroplasty on 08/13/2018. Initially seen in his orthopedics office and diagnosed with cellulitis with doppler of the LE showing no evidence of DVT. On admission to the hospital he was started on broad spectrum vancomycin and piperacillin-tazobactam. Initially febrile at home with no fevers in the hospital. Blood cultures were negative. He was diagnosed with cellulitis with the recommendation for cephalexin and doxycycline for 5 days. On 08/28/18 he called the ID office with continued redness, pain and swelling of the right lower extremity.   No Known Allergies    Outpatient Medications Prior to Visit  Medication Sig Dispense Refill  . allopurinol (ZYLOPRIM) 300 MG tablet Take 1 tablet (300 mg total) by mouth daily. (Patient not taking: Reported on 08/23/2018) 30 tablet 6  . aspirin EC 81 MG tablet Take 1 tablet (81 mg total) by mouth daily. (Patient not taking: Reported on 08/23/2018) 90 tablet 3  . cephALEXin (KEFLEX) 500 MG capsule Take 1 capsule (500 mg total) by mouth every 8 (eight) hours for 5 days. 15 capsule 0  . doxycycline (VIBRA-TABS) 100 MG tablet Take 1 tablet (100 mg total) by mouth every 12 (twelve) hours for 5 days. 10 tablet 0  . levothyroxine (SYNTHROID, LEVOTHROID) 150 MCG tablet TAKE 1 TABLET BY MOUTH ONCE DAILY ** NEEDS OFFICE VISIT FOR ADDDITIONAL REFILLS** (Patient taking differently: Take 150 mcg by mouth daily before breakfast. ) 90 tablet 3  . lisinopril (PRINIVIL,ZESTRIL) 10 MG tablet Take 1 tablet (10 mg total) by mouth daily. (Patient not taking: Reported on  08/23/2018) 30 tablet 3  . MITIGARE 0.6 MG CAPS TAKE 1 CAPSULE BY MOUTH TWICE A DAY (Patient not taking: Reported on 08/23/2018) 180 capsule 1  . Multiple Vitamins-Minerals (MENS MULTIVITAMIN PO) Take 1 tablet by mouth daily.    . oxyCODONE (OXY IR/ROXICODONE) 5 MG immediate release tablet Take 1 tablet (5 mg total) by mouth every 4 (four) hours as needed for moderate pain. 10 tablet 0  . predniSONE (DELTASONE) 20 MG tablet Take 2 tablets (40 mg total) by mouth daily for 4 days. 8 tablet 0  . testosterone cypionate (DEPOTESTOSTERONE CYPIONATE) 200 MG/ML injection Inject 0.6 mLs (120 mg total) into the muscle every 7 (seven) days. Pls include 18g needle for drawing up, 23g needle for injection. 20 mL 0   No facility-administered medications prior to visit.      Past Medical History:  Diagnosis Date  . Benign essential hypertension 08/11/2018  . Hypothyroidism   . Thyroid disease    Hypothyroid      Past Surgical History:  Procedure Laterality Date  . CERVICAL DISC SURGERY     #5  . POSTERIOR CERVICAL LAMINECTOMY WITH MET- RX Left 09/20/2014   Procedure: Left C7-T1 "Sitting" Microdiskectomy;  Surgeon: Henry Elsner, MD;  Location: [facility];  Service: Neurosurgery;  Laterality: Left;  Left C7-T1 "Sitting" Microdiskectomy  . QUADRICEPS TENDON REPAIR    . SHOULDER SURGERY        Family History  Problem Relation Age of Onset  . Heart disease Mother   .   Stroke Mother   . Diabetes Father   . Colon cancer Maternal Grandfather   . Esophageal cancer Neg Hx   . Rectal cancer Neg Hx   . Stomach cancer Neg Hx       Social History   Socioeconomic History  . Marital status: Single    Spouse name: Not on file  . Number of children: Not on file  . Years of education: Not on file  . Highest education level: Not on file  Occupational History  . Not on file  Social Needs  . Financial resource strain: Not on file  . Food insecurity:    Worry: Not on file    Inability: Not on file   . Transportation needs:    Medical: Not on file    Non-medical: Not on file  Tobacco Use  . Smoking status: Never Smoker  . Smokeless tobacco: Never Used  Substance and Sexual Activity  . Alcohol use: Yes    Alcohol/week: 0.0 standard drinks    Comment: occasional  . Drug use: No  . Sexual activity: Never  Lifestyle  . Physical activity:    Days per week: Not on file    Minutes per session: Not on file  . Stress: Not on file  Relationships  . Social connections:    Talks on phone: Not on file    Gets together: Not on file    Attends religious service: Not on file    Active member of club or organization: Not on file    Attends meetings of clubs or organizations: Not on file    Relationship status: Not on file  . Intimate partner violence:    Fear of current or ex partner: Not on file    Emotionally abused: Not on file    Physically abused: Not on file    Forced sexual activity: Not on file  Other Topics Concern  . Not on file  Social History Narrative  . Not on file      Review of Systems     Objective:    There were no vitals taken for this visit. Nursing note and vital signs reviewed.  Physical Exam      Assessment & Plan:   Problem List Items Addressed This Visit    None       I am having [place] A. Gladson maintain his levothyroxine, MITIGARE, lisinopril, aspirin EC, allopurinol, testosterone cypionate, Multiple Vitamins-Minerals (MENS MULTIVITAMIN PO), oxyCODONE, cephALEXin, doxycycline, and predniSONE.   No orders of the defined types were placed in this encounter.    Follow-up: No follow-ups on file.    Greg Calone, MSN, FNP-C Nurse Practitioner [facility] Office phone: [phone] Pager: [phone] [facility] Main number: [phone]  

## 2018-08-28 NOTE — Telephone Encounter (Signed)
THANK YOU

## 2018-08-28 NOTE — Telephone Encounter (Signed)
Patient was hospitalized recently for cellulitis. He was seen by Dr Orvan Falconer, was discharged 1/13 with 5 day course of cephalexin and doxycycline.  He states the swelling, redness, pain are still the same and he is concerned.  He has seen his surgeon and physical therapist since discharge - they asked him to follow up with ID.  Patient is scheduled to see his PCP for hospital follow up on 1/21. Please advise. Andree Coss, RN

## 2018-08-29 NOTE — Telephone Encounter (Signed)
Thanks so much Tammy Sours!

## 2018-08-31 ENCOUNTER — Ambulatory Visit: Payer: BLUE CROSS/BLUE SHIELD | Admitting: Family

## 2018-08-31 NOTE — Telephone Encounter (Signed)
Patient called front desk this morning to cancel his appointment today. He did not reschedule.

## 2018-09-01 ENCOUNTER — Ambulatory Visit: Payer: Self-pay

## 2018-09-01 DIAGNOSIS — M1711 Unilateral primary osteoarthritis, right knee: Secondary | ICD-10-CM | POA: Diagnosis not present

## 2018-09-01 NOTE — Telephone Encounter (Signed)
Help we do not have another urgent request for him to be seen

## 2018-09-03 DIAGNOSIS — M1711 Unilateral primary osteoarthritis, right knee: Secondary | ICD-10-CM | POA: Diagnosis not present

## 2018-09-07 ENCOUNTER — Telehealth: Payer: Self-pay

## 2018-09-07 NOTE — Telephone Encounter (Signed)
Needs PA for Testosterone.

## 2018-09-08 ENCOUNTER — Encounter: Payer: Self-pay | Admitting: Sports Medicine

## 2018-09-08 DIAGNOSIS — M1711 Unilateral primary osteoarthritis, right knee: Secondary | ICD-10-CM | POA: Diagnosis not present

## 2018-09-09 ENCOUNTER — Encounter: Payer: Self-pay | Admitting: Sports Medicine

## 2018-09-09 NOTE — Telephone Encounter (Signed)
Jeremiah Webb, would you prioritize this please.

## 2018-09-09 NOTE — Telephone Encounter (Signed)
PA approved through 08/11/2038. Pharmacy and patient aware.

## 2018-09-09 NOTE — Telephone Encounter (Signed)
I have called the patient and let him know its approved. No MyChart response is needed since the about was addressed and pharmacy is aware to fill the medication and PA is covered through 08/11/2038.

## 2018-09-10 ENCOUNTER — Encounter: Payer: Self-pay | Admitting: Sports Medicine

## 2018-09-10 DIAGNOSIS — M1711 Unilateral primary osteoarthritis, right knee: Secondary | ICD-10-CM | POA: Diagnosis not present

## 2018-09-13 ENCOUNTER — Other Ambulatory Visit: Payer: Self-pay | Admitting: Emergency Medicine

## 2018-09-15 DIAGNOSIS — M1711 Unilateral primary osteoarthritis, right knee: Secondary | ICD-10-CM | POA: Diagnosis not present

## 2018-09-16 ENCOUNTER — Encounter: Payer: Self-pay | Admitting: Emergency Medicine

## 2018-09-16 ENCOUNTER — Other Ambulatory Visit: Payer: Self-pay

## 2018-09-16 ENCOUNTER — Ambulatory Visit (INDEPENDENT_AMBULATORY_CARE_PROVIDER_SITE_OTHER): Payer: BLUE CROSS/BLUE SHIELD

## 2018-09-16 ENCOUNTER — Ambulatory Visit: Payer: BLUE CROSS/BLUE SHIELD | Admitting: Emergency Medicine

## 2018-09-16 VITALS — BP 147/68 | HR 91 | Temp 98.6°F | Resp 16 | Ht 73.25 in | Wt 210.6 lb

## 2018-09-16 DIAGNOSIS — R634 Abnormal weight loss: Secondary | ICD-10-CM | POA: Diagnosis not present

## 2018-09-16 DIAGNOSIS — I10 Essential (primary) hypertension: Secondary | ICD-10-CM | POA: Diagnosis not present

## 2018-09-16 DIAGNOSIS — M25571 Pain in right ankle and joints of right foot: Secondary | ICD-10-CM | POA: Insufficient documentation

## 2018-09-16 DIAGNOSIS — M109 Gout, unspecified: Secondary | ICD-10-CM | POA: Diagnosis not present

## 2018-09-16 DIAGNOSIS — M1A0710 Idiopathic chronic gout, right ankle and foot, without tophus (tophi): Secondary | ICD-10-CM | POA: Insufficient documentation

## 2018-09-16 DIAGNOSIS — E079 Disorder of thyroid, unspecified: Secondary | ICD-10-CM

## 2018-09-16 DIAGNOSIS — M19071 Primary osteoarthritis, right ankle and foot: Secondary | ICD-10-CM | POA: Diagnosis not present

## 2018-09-16 MED ORDER — LEVOTHYROXINE SODIUM 150 MCG PO TABS: 150.0000 ug | ORAL_TABLET | Freq: Every day | ORAL | 3 refills | Status: DC

## 2018-09-16 MED ORDER — PREDNISONE 20 MG PO TABS: 40.0000 mg | ORAL_TABLET | Freq: Every day | ORAL | 0 refills | Status: AC

## 2018-09-16 MED ORDER — TRAMADOL HCL 50 MG PO TABS: 50.0000 mg | ORAL_TABLET | Freq: Three times a day (TID) | ORAL | 0 refills | Status: DC | PRN

## 2018-09-16 NOTE — Progress Notes (Signed)
Jeremiah Webb 58 y.o.   Chief Complaint  Patient presents with  . Medication Refill    Levothryroxine  . Ankle Pain    RIGHT x 1 month - per patient he want an Xray to check for bone spur    HISTORY OF PRESENT ILLNESS: This is a 58 y.o. male complaining of pain and swelling to the right ankle for several days.  Denies injury.  Has a history of gout. Also has a history of thyroid disease.  Needs medication refill. No other significant symptoms  HPI   Prior to Admission medications   Medication Sig Start Date End Date Taking? Authorizing Provider  levothyroxine (SYNTHROID, LEVOTHROID) 150 MCG tablet Take 1 tablet (150 mcg total) by mouth daily before breakfast. 09/16/18 12/15/18 Yes Jadiel Schmieder, Ines Bloomer, MD  Multiple Vitamins-Minerals (MENS MULTIVITAMIN PO) Take 1 tablet by mouth daily.   Yes [provider]  lisinopril (PRINIVIL,ZESTRIL) 10 MG tablet Take 1 tablet (10 mg total) by mouth daily. Patient not taking: Reported on 09/16/2018 08/11/18   Silverio Decamp, MD  oxyCODONE (OXY IR/ROXICODONE) 5 MG immediate release tablet Take 1 tablet (5 mg total) by mouth every 4 (four) hours as needed for moderate pain. Patient not taking: Reported on 09/16/2018 08/24/18   Damita Lack, MD  testosterone cypionate (DEPOTESTOSTERONE CYPIONATE) 200 MG/ML injection Inject 0.6 mLs (120 mg total) into the muscle every 7 (seven) days. Pls include 18g needle for drawing up, 23g needle for injection. 08/17/18   Silverio Decamp, MD    No Known Allergies  Patient Active Problem List   Diagnosis Date Noted  . Cellulitis 08/23/2018  . S/P TKR (total knee replacement), right  1/20 08/23/2018  . Renal insufficiency, mild 08/23/2018  . Benign essential hypertension 08/11/2018  . Preoperative evaluation of a medical condition to rule out surgical contraindications (TAR required) 06/10/2018  . Upper airway cough syndrome 01/30/2018  . Annual physical exam 12/22/2017  . Thyroid  disease 08/26/2017  . Acquired cyst of kidney 01/20/2017  . Right shoulder pain 06/24/2016  . Male hypogonadism 06/06/2016  . Infrapatellar bursitis of right knee 12/19/2015  . Crystalline arthropathy with gout and pseudogout 09/22/2015  . Palpitations 02/14/2015  . Degenerative disc disease, cervical 04/05/2014    Past Medical History:  Diagnosis Date  . Benign essential hypertension 08/11/2018  . Hypothyroidism   . Thyroid disease    Hypothyroid    Past Surgical History:  Procedure Laterality Date  . CERVICAL DISC SURGERY     #5  . POSTERIOR CERVICAL LAMINECTOMY WITH MET- RX Left 09/20/2014   Procedure: Left C7-T1 "Sitting" Microdiskectomy;  Surgeon: Kristeen Miss, MD;  Location: [facility];  Service: Neurosurgery;  Laterality: Left;  Left C7-T1 "Sitting" Microdiskectomy  . QUADRICEPS TENDON REPAIR    . SHOULDER SURGERY      Social History   Socioeconomic History  . Marital status: Single    Spouse name: Not on file  . Number of children: Not on file  . Years of education: Not on file  . Highest education level: Not on file  Occupational History  . Not on file  Social Needs  . Financial resource strain: Not on file  . Food insecurity:    Worry: Not on file    Inability: Not on file  . Transportation needs:    Medical: Not on file    Non-medical: Not on file  Tobacco Use  . Smoking status: Never Smoker  . Smokeless tobacco: Never Used  Substance and Sexual Activity  . Alcohol use: Yes    Alcohol/week: 0.0 standard drinks    Comment: occasional  . Drug use: No  . Sexual activity: Never  Lifestyle  . Physical activity:    Days per week: Not on file    Minutes per session: Not on file  . Stress: Not on file  Relationships  . Social connections:    Talks on phone: Not on file    Gets together: Not on file    Attends religious service: Not on file    Active member of club or organization: Not on file    Attends meetings of clubs or organizations: Not on  file    Relationship status: Not on file  . Intimate partner violence:    Fear of current or ex partner: Not on file    Emotionally abused: Not on file    Physically abused: Not on file    Forced sexual activity: Not on file  Other Topics Concern  . Not on file  Social History Narrative  . Not on file    Family History  Problem Relation Age of Onset  . Heart disease Mother   . Stroke Mother   . Diabetes Father   . Colon cancer Maternal Grandfather   . Esophageal cancer Neg Hx   . Rectal cancer Neg Hx   . Stomach cancer Neg Hx      Review of Systems  Constitutional: Negative.  Negative for chills and fever.  Respiratory: Negative for cough and shortness of breath.   Cardiovascular: Negative.  Negative for chest pain and palpitations.  Gastrointestinal: Negative for abdominal pain, nausea and vomiting.  Genitourinary: Negative.   Musculoskeletal:       Right ankle pain and swelling  Skin: Negative.  Negative for rash.  Neurological: Negative.  Negative for dizziness and headaches.  All other systems reviewed and are negative.  Vitals:   09/16/18 1110  BP: (!) 147/68  Pulse: 91  Resp: 16  Temp: 98.6 F (37 C)  SpO2: 98%    Physical Exam Vitals signs reviewed.  Constitutional:      Appearance: Normal appearance.  HENT:     Head: Normocephalic.  Eyes:     Extraocular Movements: Extraocular movements intact.     Pupils: Pupils are equal, round, and reactive to light.  Cardiovascular:     Rate and Rhythm: Normal rate and regular rhythm.     Heart sounds: Normal heart sounds.  Pulmonary:     Effort: Pulmonary effort is normal.     Breath sounds: Normal breath sounds.  Musculoskeletal:     Comments: Right ankle: Positive lateral swelling with tenderness and decreased range of motion due to pain.  Skin:    General: Skin is warm and dry.     Capillary Refill: Capillary refill takes less than 2 seconds.  Neurological:     General: No focal deficit present.      Mental Status: He is alert and oriented to person, place, and time.    Dg Ankle Complete Right  Result Date: 09/16/2018 CLINICAL DATA:  Right ankle swelling and pain. EXAM: RIGHT ANKLE - COMPLETE 3+ VIEW COMPARISON:  None. FINDINGS: Soft tissue swelling, particularly laterally. No fractures. The ankle mortise is intact. No other acute abnormalities. Degenerative changes in the ankle. IMPRESSION: Degenerative changes in the ankle. Soft tissue swelling, particularly laterally. No acute fracture noted. Electronically Signed   By: Dorise Bullion III M.D   On: 09/16/2018 12:05  A total of 25 minutes was spent in the room with the patient, greater than 50% of which was in counseling/coordination of care regarding differential diagnosis, treatment, x-ray review, medication side effects, and need for follow-up if no better or worse.   ASSESSMENT & PLAN: Christianjames was seen today for medication refill and ankle pain.  Diagnoses and all orders for this visit:  Acute gouty arthritis -     predniSONE (DELTASONE) 20 MG tablet; Take 2 tablets (40 mg total) by mouth daily with breakfast for 5 days.  Benign essential hypertension -     Comprehensive metabolic panel  Chronic gout of right ankle, unspecified cause -     Uric Acid  Acute right ankle pain -     DG Ankle Complete Right; Future -     traMADol (ULTRAM) 50 MG tablet; Take 1 tablet (50 mg total) by mouth every 8 (eight) hours as needed for severe pain.  Thyroid disease -     Thyroid Panel With TSH -     levothyroxine (SYNTHROID, LEVOTHROID) 150 MCG tablet; Take 1 tablet (150 mcg total) by mouth daily before breakfast.    Patient Instructions       If you have lab work done today you will be contacted with your lab results within the next 2 weeks.  If you have not heard from [place] then please contact us. The fastest way to get your results is to register for My Chart.   IF you received an x-ray today, you will receive an invoice from  [facility]. Please contact [facility] at [phone] with questions or concerns regarding your invoice.   IF you received labwork today, you will receive an invoice from [facility]. Please contact [facility] at [phone] with questions or concerns regarding your invoice.   Our billing staff will not be able to assist you with questions regarding bills from these companies.  You will be contacted with the lab results as soon as they are available. The fastest way to get your results is to activate your My Chart account. Instructions are located on the last page of this paperwork. If you have not heard from [place] regarding the results in 2 weeks, please contact this office.     Gout  Gout is painful swelling of your joints. Gout is a type of arthritis. It is caused by having too much uric acid in your body. Uric acid is a chemical that is made when your body breaks down substances called purines. If your body has too much uric acid, sharp crystals can form and build up in your joints. This causes pain and swelling. Gout attacks can happen quickly and be very painful (acute gout). Over time, the attacks can affect more joints and happen more often (chronic gout). What are the causes?  Too much uric acid in your blood. This can happen because: ? Your kidneys do not remove enough uric acid from your blood. ? Your body makes too much uric acid. ? You eat too many foods that are high in purines. These foods include organ meats, some seafood, and beer.  Trauma or stress. What increases the risk?  Having a family history of gout.  Being male and middle-aged.  Being male and having gone through menopause.  Being very overweight (obese).  Drinking alcohol, especially beer.  Not having enough water in the body (being dehydrated).  Losing weight too quickly.  Having an organ transplant.  Having lead poisoning.  Taking certain medicines.  Having kidney  disease.  Having a skin condition called psoriasis. What are the signs or symptoms? An attack of acute gout usually happens in just one joint. The most common place is the big toe. Attacks often start at night. Other joints that may be affected include joints of the feet, ankle, knee, fingers, wrist, or elbow. Symptoms of an attack may include:  Very bad pain.  Warmth.  Swelling.  Stiffness.  Shiny, red, or purple skin.  Tenderness. The affected joint may be very painful to touch.  Chills and fever. Chronic gout may cause symptoms more often. More joints may be involved. You may also have white or yellow lumps (tophi) on your hands or feet or in other areas near your joints. How is this treated?  Treatment for this condition has two phases: treating an acute attack and preventing future attacks.  Acute gout treatment may include: ? NSAIDs. ? Steroids. These are taken by mouth or injected into a joint. ? Colchicine. This medicine relieves pain and swelling. It can be given by mouth or through an IV tube.  Preventive treatment may include: ? Taking small doses of NSAIDs or colchicine daily. ? Using a medicine that reduces uric acid levels in your blood. ? Making changes to your diet. You may need to see a food expert (dietitian) about what to eat and drink to prevent gout. Follow these instructions at home: During a gout attack   If told, put ice on the painful area: ? Put ice in a plastic bag. ? Place a towel between your skin and the bag. ? Leave the ice on for 20 minutes, 2-3 times a day.  Raise (elevate) the painful joint above the level of your heart as often as you can.  Rest the joint as much as possible. If the joint is in your leg, you may be given crutches.  Follow instructions from your doctor about what you cannot eat or drink. Avoiding future gout attacks  Eat a low-purine diet. Avoid foods and drinks such  as: ? Liver. ? Kidney. ? Anchovies. ? Asparagus. ? Herring. ? Mushrooms. ? Mussels. ? Beer.  Stay at a healthy weight. If you want to lose weight, talk with your doctor. Do not lose weight too fast.  Start or continue an exercise plan as told by your doctor. Eating and drinking  Drink enough fluids to keep your pee (urine) pale yellow.  If you drink alcohol: ? Limit how much you use to:  0-1 drink a day for women.  0-2 drinks a day for men. ? Be aware of how much alcohol is in your drink. In the [place] one drink equals one 12 oz bottle of beer (355 mL), one 5 oz glass of wine (148 mL), or one 1 oz glass of hard liquor (44 mL). General instructions  Take over-the-counter and prescription medicines only as told by your doctor.  Do not drive or use heavy machinery while taking prescription pain medicine.  Return to your normal activities as told by your doctor. Ask your doctor what activities are safe for you.  Keep all follow-up visits as told by your doctor. This is important. Contact a doctor if:  You have another gout attack.  You still have symptoms of a gout attack after 10 days of treatment.  You have problems (side effects) because of your medicines.  You have chills or a fever.  You have burning pain when you pee (urinate).  You have pain in your  lower back or belly. Get help right away if:  You have very bad pain.  Your pain cannot be controlled.  You cannot pee. Summary  Gout is painful swelling of the joints.  The most common site of pain is the big toe, but it can affect other joints.  Medicines and avoiding some foods can help to prevent and treat gout attacks. This information is not intended to replace advice given to you by your health care provider. Make sure you discuss any questions you have with your health care provider. Document Released: 05/07/2008 Document Revised: 02/18/2018 Document Reviewed: 02/18/2018 Elsevier Interactive Patient  Education  2019 Elsevier Inc.     Agustina Caroli, MD [facility]

## 2018-09-16 NOTE — Patient Instructions (Addendum)
   If you have lab work done today you will be contacted with your lab results within the next 2 weeks.  If you have not heard from us then please contact us. The fastest way to get your results is to register for My Chart.   IF you received an x-ray today, you will receive an invoice from [facility]. Please contact [facility] at [phone] with questions or concerns regarding your invoice.   IF you received labwork today, you will receive an invoice from [facility]. Please contact [facility] at [phone] with questions or concerns regarding your invoice.   Our billing staff will not be able to assist you with questions regarding bills from these companies.  You will be contacted with the lab results as soon as they are available. The fastest way to get your results is to activate your My Chart account. Instructions are located on the last page of this paperwork. If you have not heard from us regarding the results in 2 weeks, please contact this office.     Gout  Gout is painful swelling of your joints. Gout is a type of arthritis. It is caused by having too much uric acid in your body. Uric acid is a chemical that is made when your body breaks down substances called purines. If your body has too much uric acid, sharp crystals can form and build up in your joints. This causes pain and swelling. Gout attacks can happen quickly and be very painful (acute gout). Over time, the attacks can affect more joints and happen more often (chronic gout). What are the causes?  Too much uric acid in your blood. This can happen because: ? Your kidneys do not remove enough uric acid from your blood. ? Your body makes too much uric acid. ? You eat too many foods that are high in purines. These foods include organ meats, some seafood, and beer.  Trauma or stress. What increases the risk?  Having a family history of gout.  Being male and middle-aged.  Being male and having gone  through menopause.  Being very overweight (obese).  Drinking alcohol, especially beer.  Not having enough water in the body (being dehydrated).  Losing weight too quickly.  Having an organ transplant.  Having lead poisoning.  Taking certain medicines.  Having kidney disease.  Having a skin condition called psoriasis. What are the signs or symptoms? An attack of acute gout usually happens in just one joint. The most common place is the big toe. Attacks often start at night. Other joints that may be affected include joints of the feet, ankle, knee, fingers, wrist, or elbow. Symptoms of an attack may include:  Very bad pain.  Warmth.  Swelling.  Stiffness.  Shiny, red, or purple skin.  Tenderness. The affected joint may be very painful to touch.  Chills and fever. Chronic gout may cause symptoms more often. More joints may be involved. You may also have white or yellow lumps (tophi) on your hands or feet or in other areas near your joints. How is this treated?  Treatment for this condition has two phases: treating an acute attack and preventing future attacks.  Acute gout treatment may include: ? NSAIDs. ? Steroids. These are taken by mouth or injected into a joint. ? Colchicine. This medicine relieves pain and swelling. It can be given by mouth or through an IV tube.  Preventive treatment may include: ? Taking small doses of NSAIDs or colchicine daily. ? Using a   medicine that reduces uric acid levels in your blood. ? Making changes to your diet. You may need to see a food expert (dietitian) about what to eat and drink to prevent gout. Follow these instructions at home: During a gout attack   If told, put ice on the painful area: ? Put ice in a plastic bag. ? Place a towel between your skin and the bag. ? Leave the ice on for 20 minutes, 2-3 times a day.  Raise (elevate) the painful joint above the level of your heart as often as you can.  Rest the joint as  much as possible. If the joint is in your leg, you may be given crutches.  Follow instructions from your doctor about what you cannot eat or drink. Avoiding future gout attacks  Eat a low-purine diet. Avoid foods and drinks such as: ? Liver. ? Kidney. ? Anchovies. ? Asparagus. ? Herring. ? Mushrooms. ? Mussels. ? Beer.  Stay at a healthy weight. If you want to lose weight, talk with your doctor. Do not lose weight too fast.  Start or continue an exercise plan as told by your doctor. Eating and drinking  Drink enough fluids to keep your pee (urine) pale yellow.  If you drink alcohol: ? Limit how much you use to:  0-1 drink a day for women.  0-2 drinks a day for men. ? Be aware of how much alcohol is in your drink. In the [place] one drink equals one 12 oz bottle of beer (355 mL), one 5 oz glass of wine (148 mL), or one 1 oz glass of hard liquor (44 mL). General instructions  Take over-the-counter and prescription medicines only as told by your doctor.  Do not drive or use heavy machinery while taking prescription pain medicine.  Return to your normal activities as told by your doctor. Ask your doctor what activities are safe for you.  Keep all follow-up visits as told by your doctor. This is important. Contact a doctor if:  You have another gout attack.  You still have symptoms of a gout attack after 10 days of treatment.  You have problems (side effects) because of your medicines.  You have chills or a fever.  You have burning pain when you pee (urinate).  You have pain in your lower back or belly. Get help right away if:  You have very bad pain.  Your pain cannot be controlled.  You cannot pee. Summary  Gout is painful swelling of the joints.  The most common site of pain is the big toe, but it can affect other joints.  Medicines and avoiding some foods can help to prevent and treat gout attacks. This information is not intended to replace advice given  to you by your health care provider. Make sure you discuss any questions you have with your health care provider. Document Released: 05/07/2008 Document Revised: 02/18/2018 Document Reviewed: 02/18/2018 Elsevier Interactive Patient Education  2019 Elsevier Inc.  

## 2018-09-17 DIAGNOSIS — M1711 Unilateral primary osteoarthritis, right knee: Secondary | ICD-10-CM | POA: Diagnosis not present

## 2018-09-17 LAB — COMPREHENSIVE METABOLIC PANEL
ALT: 18 IU/L (ref 0–44)
AST: 19 IU/L (ref 0–40)
Albumin/Globulin Ratio: 1.4 (ref 1.2–2.2)
Albumin: 3.9 g/dL (ref 3.8–4.9)
Alkaline Phosphatase: 64 IU/L (ref 39–117)
BUN/Creatinine Ratio: 10 (ref 9–20)
BUN: 15 mg/dL (ref 6–24)
Bilirubin Total: 0.6 mg/dL (ref 0.0–1.2)
CO2: 22 mmol/L (ref 20–29)
Calcium: 9.7 mg/dL (ref 8.7–10.2)
Chloride: 106 mmol/L (ref 96–106)
Creatinine, Ser: 1.47 mg/dL — ABNORMAL HIGH (ref 0.76–1.27)
GFR calc Af Amer: 61 mL/min/{1.73_m2} (ref >59)
GFR calc non Af Amer: 53 mL/min/{1.73_m2} — ABNORMAL LOW (ref >59)
GLUCOSE: 98 mg/dL (ref 65–99)
Globulin, Total: 2.8 g/dL (ref 1.5–4.5)
Potassium: 4.9 mmol/L (ref 3.5–5.2)
Sodium: 143 mmol/L (ref 134–144)
Total Protein: 6.7 g/dL (ref 6.0–8.5)

## 2018-09-17 LAB — THYROID PANEL WITH TSH
Free Thyroxine Index: 2.3 (ref 1.2–4.9)
T3 Uptake Ratio: 31 % (ref 24–39)
T4, Total: 7.4 ug/dL (ref 4.5–12.0)
TSH: 6.04 u[IU]/mL — ABNORMAL HIGH (ref 0.450–4.500)

## 2018-09-17 LAB — URIC ACID: Uric Acid: 6.7 mg/dL (ref 3.7–8.6)

## 2018-09-21 ENCOUNTER — Encounter: Payer: Self-pay | Admitting: Sports Medicine

## 2018-09-21 DIAGNOSIS — M1711 Unilateral primary osteoarthritis, right knee: Secondary | ICD-10-CM | POA: Diagnosis not present

## 2018-09-21 DIAGNOSIS — E291 Testicular hypofunction: Secondary | ICD-10-CM

## 2018-09-22 ENCOUNTER — Encounter: Payer: Self-pay | Admitting: Emergency Medicine

## 2018-09-22 DIAGNOSIS — M1711 Unilateral primary osteoarthritis, right knee: Secondary | ICD-10-CM | POA: Diagnosis not present

## 2018-09-23 ENCOUNTER — Other Ambulatory Visit: Payer: Self-pay | Admitting: Emergency Medicine

## 2018-09-23 DIAGNOSIS — M25571 Pain in right ankle and joints of right foot: Secondary | ICD-10-CM

## 2018-09-23 NOTE — Telephone Encounter (Signed)
Rest of thyroid hormone levels are normal.  Continue present dose.  Thanks.

## 2018-09-23 NOTE — Telephone Encounter (Signed)
Needs Ortho evaluation first.  Referral sent.  Thanks.

## 2018-09-24 DIAGNOSIS — M1711 Unilateral primary osteoarthritis, right knee: Secondary | ICD-10-CM | POA: Diagnosis not present

## 2018-09-25 DIAGNOSIS — Z96659 Presence of unspecified artificial knee joint: Secondary | ICD-10-CM | POA: Diagnosis not present

## 2018-09-25 DIAGNOSIS — M1711 Unilateral primary osteoarthritis, right knee: Secondary | ICD-10-CM | POA: Diagnosis not present

## 2018-09-25 NOTE — Telephone Encounter (Signed)
Copied from CRM [id number]. Topic: Referral - Status >> Sep 25, 2018 11:05 AM Maia Petties wrote: Reason for CRM: Pt called to check on request for MRI. Advised him that Dr. Alvy Bimler placed ortho referral. Pt states that he would like referral sent to [facility] instead because he is current pt with Dr. Marcene Corning. Please send asap for evaluation on right ankle.

## 2018-09-26 ENCOUNTER — Telehealth: Payer: Self-pay | Admitting: Emergency Medicine

## 2018-09-26 NOTE — Telephone Encounter (Signed)
Copied from CRM [id number]. Topic: Referral - Status >> Sep 25, 2018 11:05 AM Maia Petties wrote: Reason for CRM: Pt called to check on request for MRI. Advised him that Dr. Alvy Bimler placed ortho referral. Pt states that he would like referral sent to [facility] instead because he is current pt with Dr. Marcene Corning. Please send asap for evaluation on right ankle. >> Sep 25, 2018  6:04 PM Jilda Roche wrote: Patient would like the referral for the MRI to go to Dr. Rodney Langton instead of the previous call from this morning. >> Sep 25, 2018  6:05 PM Jilda Roche wrote: Phone is [phone] Fax is [phone]

## 2018-09-28 ENCOUNTER — Encounter: Payer: Self-pay | Admitting: Sports Medicine

## 2018-09-28 DIAGNOSIS — M1711 Unilateral primary osteoarthritis, right knee: Secondary | ICD-10-CM | POA: Diagnosis not present

## 2018-09-28 NOTE — Telephone Encounter (Signed)
SENT PATIENTS REFERRAL TO [facility] PER HIS REQUEST 09/28/2018

## 2018-09-29 ENCOUNTER — Ambulatory Visit: Payer: Self-pay | Admitting: Sports Medicine

## 2018-09-30 ENCOUNTER — Ambulatory Visit (INDEPENDENT_AMBULATORY_CARE_PROVIDER_SITE_OTHER): Payer: BLUE CROSS/BLUE SHIELD | Admitting: Sports Medicine

## 2018-09-30 ENCOUNTER — Encounter: Payer: Self-pay | Admitting: Sports Medicine

## 2018-09-30 DIAGNOSIS — M25571 Pain in right ankle and joints of right foot: Secondary | ICD-10-CM | POA: Diagnosis not present

## 2018-09-30 DIAGNOSIS — I10 Essential (primary) hypertension: Secondary | ICD-10-CM

## 2018-09-30 DIAGNOSIS — M1711 Unilateral primary osteoarthritis, right knee: Secondary | ICD-10-CM | POA: Diagnosis not present

## 2018-09-30 NOTE — Assessment & Plan Note (Signed)
Ankle effusion, arthrocentesis today, crystal analysis. X-rays did show some osteoarthritis. Reviewed MRI, he has persistent pain and mechanical symptoms. Return to see me in a month for this. If there are uric acid crystals then we will probably need to start allopurinol.

## 2018-09-30 NOTE — Assessment & Plan Note (Signed)
Tends to run normal at home, usually 120s to 130s systolic.  No headaches, visual changes, chest pain.  He has not yet started his lisinopril.

## 2018-09-30 NOTE — Progress Notes (Signed)
Subjective:    CC: Right ankle swelling  HPI: Pleasant 58 year old male, he is post right knee arthroplasty.  Unfortunately he developed some swelling of the right lower leg, no DVTs, suspected to be a cellulitis.  Treated with antibiotics.  Overall is improved, he continues to have some swelling and pain in his right ankle, he is here for further evaluation and definitive treatment.  Hypertension: Tends to run normal at home, usually 161W to 960A systolic.  No headaches, visual changes, chest pain.  He has not yet started his lisinopril.  I reviewed the past medical history, family history, social history, surgical history, and allergies today and no changes were needed.  Please see the problem list section below in epic for further details.  Past Medical History: Past Medical History:  Diagnosis Date  . Benign essential hypertension 08/11/2018  . Hypothyroidism   . Thyroid disease    Hypothyroid   Past Surgical History: Past Surgical History:  Procedure Laterality Date  . CERVICAL DISC SURGERY     #5  . POSTERIOR CERVICAL LAMINECTOMY WITH MET- RX Left 09/20/2014   Procedure: Left C7-T1 "Sitting" Microdiskectomy;  Surgeon: Kristeen Miss, MD;  Location: [place] [facility];  Service: Neurosurgery;  Laterality: Left;  Left C7-T1 "Sitting" Microdiskectomy  . QUADRICEPS TENDON REPAIR    . SHOULDER SURGERY     Social History: Social History   Socioeconomic History  . Marital status: Single    Spouse name: Not on file  . Number of children: Not on file  . Years of education: Not on file  . Highest education level: Not on file  Occupational History  . Not on file  Social Needs  . Financial resource strain: Not on file  . Food insecurity:    Worry: Not on file    Inability: Not on file  . Transportation needs:    Medical: Not on file    Non-medical: Not on file  Tobacco Use  . Smoking status: Never Smoker  . Smokeless tobacco: Never Used  Substance and Sexual Activity  . Alcohol  use: Yes    Alcohol/week: 0.0 standard drinks    Comment: occasional  . Drug use: No  . Sexual activity: Never  Lifestyle  . Physical activity:    Days per week: Not on file    Minutes per session: Not on file  . Stress: Not on file  Relationships  . Social connections:    Talks on phone: Not on file    Gets together: Not on file    Attends religious service: Not on file    Active member of club or organization: Not on file    Attends meetings of clubs or organizations: Not on file    Relationship status: Not on file  Other Topics Concern  . Not on file  Social History Narrative  . Not on file   Family History: Family History  Problem Relation Age of Onset  . Heart disease Mother   . Stroke Mother   . Diabetes Father   . Colon cancer Maternal Grandfather   . Esophageal cancer Neg Hx   . Rectal cancer Neg Hx   . Stomach cancer Neg Hx    Allergies: No Known Allergies Medications: See med rec.  Review of Systems: No fevers, chills, night sweats, weight loss, chest pain, or shortness of breath.   Objective:    General: Well Developed, well nourished, and in no acute distress.  Neuro: Alert and oriented x3, extra-ocular muscles intact, sensation  grossly intact.  HEENT: Normocephalic, atraumatic, pupils equal round reactive to light, neck supple, no masses, no lymphadenopathy, thyroid nonpalpable.  Skin: Warm and dry, no rashes. Cardiac: Regular rate and rhythm, no murmurs rubs or gallops, no lower extremity edema.  Respiratory: Clear to auscultation bilaterally. Not using accessory muscles, speaking in full sentences. Right ankle: Swollen, palpable fluid wave and effusion Range of motion is full in all directions. Strength is 5/5 in all directions. Stable lateral and medial ligaments; squeeze test and kleiger test unremarkable; Talar dome nontender; No pain at base of 5th MT; No tenderness over cuboid; No tenderness over N spot or navicular prominence No tenderness  on posterior aspects of lateral and medial malleolus No sign of peroneal tendon subluxations; Negative tarsal tunnel tinel's Able to walk 4 steps.  Procedure: Real-time Ultrasound Guided aspiration/injection of right ankle Device: GE Logiq E  Verbal informed consent obtained.  Time-out conducted.  Noted no overlying erythema, induration, or other signs of local infection.  Skin prepped in a sterile fashion.  Local anesthesia: Topical Ethyl chloride.  With sterile technique and under real time ultrasound guidance: Using a 22-gauge needle advanced into the tibiotalar joint, aspirated 2 cc of clear, straw-colored fluid, syringe switched and 1 cc Kenalog 40, 1 cc lidocaine, 1 cc bupivacaine injected easily Completed without difficulty  Pain immediately resolved suggesting accurate placement of the medication.  Advised to call if fevers/chills, erythema, induration, drainage, or persistent bleeding.  Images permanently stored and available for review in the ultrasound unit.  Impression: Technically successful ultrasound guided injection.  Impression and Recommendations:    Acute right ankle pain Ankle effusion, arthrocentesis today, crystal analysis. X-rays did show some osteoarthritis. Reviewed MRI, he has persistent pain and mechanical symptoms. Return to see me in a month for this. If there are uric acid crystals then we will probably need to start allopurinol.  Benign essential hypertension Tends to run normal at home, usually 582P to 189Q systolic.  No headaches, visual changes, chest pain.  He has not yet started his lisinopril. ___________________________________________ Gwen Her. Dianah Field, M.D., ABFM., CAQSM. Primary Care and Sports Medicine [facility]  Adjunct Professor of [employer] [facility] [employer]

## 2018-10-01 LAB — SYNOVIAL FLUID, CRYSTAL

## 2018-10-08 ENCOUNTER — Encounter: Payer: Self-pay | Admitting: Sports Medicine

## 2018-10-08 DIAGNOSIS — E291 Testicular hypofunction: Secondary | ICD-10-CM | POA: Diagnosis not present

## 2018-10-12 ENCOUNTER — Inpatient Hospital Stay: Admission: RE | Admit: 2018-10-12 | Payer: Self-pay | Source: Ambulatory Visit

## 2018-10-12 LAB — CBC
HCT: 44.5 % (ref 38.5–50.0)
Hemoglobin: 14.9 g/dL (ref 13.2–17.1)
MCH: 30.2 pg (ref 27.0–33.0)
MCHC: 33.5 g/dL (ref 32.0–36.0)
MCV: 90.1 fL (ref 80.0–100.0)
MPV: 9.3 fL (ref 7.5–12.5)
Platelets: 356 10*3/uL (ref 140–400)
RBC: 4.94 10*6/uL (ref 4.20–5.80)
RDW: 13.5 % (ref 11.0–15.0)
WBC: 9.4 10*3/uL (ref 3.8–10.8)

## 2018-10-12 LAB — TESTOSTERONE, FREE & TOTAL
Free Testosterone: 235.3 pg/mL — ABNORMAL HIGH (ref 35.0–155.0)
Testosterone, Total, LC-MS-MS: 774 ng/dL (ref 250–1100)

## 2018-10-13 ENCOUNTER — Telehealth: Payer: Self-pay | Admitting: Emergency Medicine

## 2018-10-13 ENCOUNTER — Encounter: Payer: Self-pay | Admitting: Sports Medicine

## 2018-10-13 NOTE — Telephone Encounter (Signed)
10/13/2018 - PATIENT CALLED TO TELL THE CALL CENTER THAT HE NEEDED A COPY OF HIS AFTER VISIT SUMMARY FROM WHEN HE SAW DR. SAGARDIA IN June OF 2018. THE DATE HE GAVE THE CALL CENTER WAS 01/27/2017. HE WAS NOT SEEN ON THAT DAY, BUT WAS SEEN ON 01/20/2017. I HAVE PRINTED IT OUT AND CALLED TO LEAVE HIM A MESSAGE THAT HE CAN PICK IT UP AT THE FRONT DEST. I DID O.K. THIS WITH CHANDA. MBC

## 2018-10-14 ENCOUNTER — Ambulatory Visit (INDEPENDENT_AMBULATORY_CARE_PROVIDER_SITE_OTHER): Payer: BLUE CROSS/BLUE SHIELD | Admitting: Sports Medicine

## 2018-10-14 ENCOUNTER — Encounter: Payer: Self-pay | Admitting: Sports Medicine

## 2018-10-14 DIAGNOSIS — I10 Essential (primary) hypertension: Secondary | ICD-10-CM | POA: Diagnosis not present

## 2018-10-14 DIAGNOSIS — R634 Abnormal weight loss: Secondary | ICD-10-CM | POA: Diagnosis not present

## 2018-10-14 DIAGNOSIS — Z96651 Presence of right artificial knee joint: Secondary | ICD-10-CM

## 2018-10-14 NOTE — Progress Notes (Signed)
Subjective:    CC: Weight loss  HPI: Jeremiah Webb returns, he is a pleasant 58 year old male, over the past few months he is lost about 30 pounds.  This has been unintentional.  His labs are for the most part been normal, he is interested in an age-appropriate cancer screening.  He also has some concerns about his knee replacement, he is 2 months post arthroplasty, he has almost 100 degrees of flexion.  He is concerned that he will develop adhesions and not be able to achieve full flexion.  Benign essential hypertension: Well-controlled today, he has not started his lisinopril.  I reviewed the past medical history, family history, social history, surgical history, and allergies today and no changes were needed.  Please see the problem list section below in epic for further details.  Past Medical History: Past Medical History:  Diagnosis Date  . Benign essential hypertension 08/11/2018  . Hypothyroidism   . Thyroid disease    Hypothyroid   Past Surgical History: Past Surgical History:  Procedure Laterality Date  . CERVICAL DISC SURGERY     #5  . POSTERIOR CERVICAL LAMINECTOMY WITH MET- RX Left 09/20/2014   Procedure: Left C7-T1 "Sitting" Microdiskectomy;  Surgeon: Kristeen Miss, MD;  Location: [place] [facility];  Service: Neurosurgery;  Laterality: Left;  Left C7-T1 "Sitting" Microdiskectomy  . QUADRICEPS TENDON REPAIR    . SHOULDER SURGERY     Social History: Social History   Socioeconomic History  . Marital status: Single    Spouse name: Not on file  . Number of children: Not on file  . Years of education: Not on file  . Highest education level: Not on file  Occupational History  . Not on file  Social Needs  . Financial resource strain: Not on file  . Food insecurity:    Worry: Not on file    Inability: Not on file  . Transportation needs:    Medical: Not on file    Non-medical: Not on file  Tobacco Use  . Smoking status: Never Smoker  . Smokeless tobacco: Never Used    Substance and Sexual Activity  . Alcohol use: Yes    Alcohol/week: 0.0 standard drinks    Comment: occasional  . Drug use: No  . Sexual activity: Never  Lifestyle  . Physical activity:    Days per week: Not on file    Minutes per session: Not on file  . Stress: Not on file  Relationships  . Social connections:    Talks on phone: Not on file    Gets together: Not on file    Attends religious service: Not on file    Active member of club or organization: Not on file    Attends meetings of clubs or organizations: Not on file    Relationship status: Not on file  Other Topics Concern  . Not on file  Social History Narrative  . Not on file   Family History: Family History  Problem Relation Age of Onset  . Heart disease Mother   . Stroke Mother   . Diabetes Father   . Colon cancer Maternal Grandfather   . Esophageal cancer Neg Hx   . Rectal cancer Neg Hx   . Stomach cancer Neg Hx    Allergies: No Known Allergies Medications: See med rec.  Review of Systems: No fevers, chills, night sweats, weight loss, chest pain, or shortness of breath.   Objective:    General: Well Developed, well nourished, and in no acute distress.  Neuro: Alert and oriented x3, extra-ocular muscles intact, sensation grossly intact.  HEENT: Normocephalic, atraumatic, pupils equal round reactive to light, neck supple, no masses, no lymphadenopathy, thyroid nonpalpable.  Skin: Warm and dry, no rashes. Cardiac: Regular rate and rhythm, no murmurs rubs or gallops, no lower extremity edema.  Respiratory: Clear to auscultation bilaterally. Not using accessory muscles, speaking in full sentences.  Impression and Recommendations:    Abnormal weight loss Patient has for the most part had an age-appropriate cancer screening, recent colonoscopy was negative, PSA normal, CBC, CMP all normal.  TSH was a little bit high but T3-T4 were normal. He does have a smoking history, adding a screening chest CT. I do  think he could lose some more weight.  Benign essential hypertension Diet controlled with his weight loss, no need for lisinopril.  S/P TKR (total knee replacement), right  1/20 2 months post total knee arthroplasty, 100 degrees of flexion, I think he is doing extremely well. I did advise him that he needs to be somewhat more patient with regards to residual pain in the back of his knee, and that is way too early to consider a manipulation under anesthesia. ___________________________________________ Gwen Her. Dianah Field, M.D., ABFM., CAQSM. Primary Care and Sports Medicine [facility]  Adjunct Professor of [employer] [facility] of Medicine

## 2018-10-14 NOTE — Assessment & Plan Note (Signed)
2 months post total knee arthroplasty, 100 degrees of flexion, I think he is doing extremely well. I did advise him that he needs to be somewhat more patient with regards to residual pain in the back of his knee, and that is way too early to consider a manipulation under anesthesia.

## 2018-10-14 NOTE — Assessment & Plan Note (Signed)
Diet controlled with his weight loss, no need for lisinopril.

## 2018-10-14 NOTE — Assessment & Plan Note (Signed)
Patient has for the most part had an age-appropriate cancer screening, recent colonoscopy was negative, PSA normal, CBC, CMP all normal.  TSH was a little bit high but T3-T4 were normal. He does have a smoking history, adding a screening chest CT. I do think he could lose some more weight.

## 2018-10-15 ENCOUNTER — Telehealth: Payer: Self-pay | Admitting: Internal Medicine

## 2018-10-15 NOTE — Telephone Encounter (Signed)
Will route this over to [place]. Please advise.

## 2018-10-17 ENCOUNTER — Other Ambulatory Visit: Payer: Self-pay

## 2018-10-19 ENCOUNTER — Encounter: Payer: Self-pay | Admitting: Sports Medicine

## 2018-10-19 DIAGNOSIS — M25661 Stiffness of right knee, not elsewhere classified: Secondary | ICD-10-CM | POA: Diagnosis not present

## 2018-10-19 DIAGNOSIS — Q6650 Congenital pes planus, unspecified foot: Secondary | ICD-10-CM | POA: Insufficient documentation

## 2018-10-19 DIAGNOSIS — M79671 Pain in right foot: Secondary | ICD-10-CM

## 2018-10-19 NOTE — Telephone Encounter (Signed)
Ok thanks, will route to [employer]

## 2018-10-19 NOTE — Telephone Encounter (Signed)
Spoke with pt, he states we have to send more diagnostic codes on his last visit with Dr. Sherene Sires. He was billed under upper airway cough syndrome and he received a 800 dollar bill. Dr. Sherene Sires is there any more codes we could add to this OV?  Assessment & Plan Note by Nyoka Cowden, MD at 03/06/2018 1:19 PM  Author: Nyoka Cowden, MD Author Type: Physician Filed: 03/07/2018 9:03 AM  Note Status: [place] Cater: Cosign Not Required Encounter Date: 03/06/2018  Problem: Upper airway cough syndrome  Editor: Nyoka Cowden, MD (Physician)  Prior Versions: 1. Nyoka Cowden, MD (Physician) at 03/06/2018 1:23 PM - Written    Allergy profile 01/30/2018 >  Eos 0.5 /  IgE  1092 RAST pan positive - pft's 03/06/2018 wnl  - Methacholine challenge while on gerd rx    I had an extended final summary discussion with the patient reviewing all relevant studies completed to date and  lasting 15 to 20 minutes of a 25 minute visit on the following issues:   1) pfts completely nl but do not rule out asthma > needs MCT to complete the w/u   2) gerd not likely as no better on rx / diet but should continue rx thru day of MCT to reduce risk of false pos  3) absence of symptoms during sleep and heavy  ex strongly against any lung problem and also reassuring re cardiac issues   4) Pos allergy tests place him at risk of symptoms but do not prove cause and effect with any of the   allergens suggested by the allergy screen eg he no longer has a dog x 2 years, symptoms don't change in middle of winter vs all spring   5) pulmonary f/u can be prn if MCT neg and symptoms do not flare off gerd rx    Each maintenance medication was reviewed in detail including most importantly the difference between maintenance and as needed and under what circumstances the prns are to be used.  Please see AVS for specific  Instructions which are unique to this visit and I personally typed out  which were reviewed in detail in writing  with the patient and a copy provided.

## 2018-10-19 NOTE — Assessment & Plan Note (Signed)
Adding an MRI of the right foot, he does endorse pain running around the bottom of his foot. Certainly if unremarkable we can consider further evaluation of his lumbar spine.

## 2018-10-19 NOTE — Telephone Encounter (Signed)
All we charged for on that day was Level IV f/u ov so he must be getting confused re dates of service  - send back to Marisue Ivan to see if she can contact pt to clarify what charges he received from this office on what dates

## 2018-10-19 NOTE — Telephone Encounter (Signed)
Pt returning call [phone]//kob

## 2018-10-20 NOTE — Telephone Encounter (Signed)
Will wait on follow up from [place]. Thank you.

## 2018-10-21 ENCOUNTER — Other Ambulatory Visit: Payer: Self-pay | Admitting: Sports Medicine

## 2018-10-21 DIAGNOSIS — M25571 Pain in right ankle and joints of right foot: Secondary | ICD-10-CM

## 2018-10-21 NOTE — Telephone Encounter (Signed)
Waiting on follow up or update from [place].

## 2018-10-22 ENCOUNTER — Other Ambulatory Visit: Payer: Self-pay

## 2018-10-22 ENCOUNTER — Encounter: Payer: Self-pay | Admitting: Sports Medicine

## 2018-10-22 ENCOUNTER — Ambulatory Visit (INDEPENDENT_AMBULATORY_CARE_PROVIDER_SITE_OTHER): Payer: BLUE CROSS/BLUE SHIELD | Admitting: Sports Medicine

## 2018-10-22 DIAGNOSIS — Q6650 Congenital pes planus, unspecified foot: Secondary | ICD-10-CM

## 2018-10-22 NOTE — Assessment & Plan Note (Signed)
Awaiting MRI of the right foot and ankle. He does likely have some intertarsal degenerative changes as well as low level crystalline arthropathy. Referral for custom molded orthotics.

## 2018-10-22 NOTE — Progress Notes (Signed)
Subjective:    CC: Foot pain  HPI: This is a pleasant 58 year old male, for years he has had pain on both feet, moderate, persistent, localized to the dorsum of the midfoot.  He has mild swelling.  We have trouble for ankle swelling and pain, we did an arthrocentesis, injection.  Ultimately he is going for a MRI of the ankle and foot this weekend.  Foot pain is fairly diffuse and bilateral, also present significantly over the base of the fifth metatarsal.  I reviewed the past medical history, family history, social history, surgical history, and allergies today and no changes were needed.  Please see the problem list section below in epic for further details.  Past Medical History: Past Medical History:  Diagnosis Date  . Benign essential hypertension 08/11/2018  . Hypothyroidism   . Thyroid disease    Hypothyroid   Past Surgical History: Past Surgical History:  Procedure Laterality Date  . CERVICAL DISC SURGERY     #5  . POSTERIOR CERVICAL LAMINECTOMY WITH MET- RX Left 09/20/2014   Procedure: Left C7-T1 "Sitting" Microdiskectomy;  Surgeon: Kristeen Miss, MD;  Location: [place] [facility];  Service: Neurosurgery;  Laterality: Left;  Left C7-T1 "Sitting" Microdiskectomy  . QUADRICEPS TENDON REPAIR    . SHOULDER SURGERY     Social History: Social History   Socioeconomic History  . Marital status: Single    Spouse name: Not on file  . Number of children: Not on file  . Years of education: Not on file  . Highest education level: Not on file  Occupational History  . Not on file  Social Needs  . Financial resource strain: Not on file  . Food insecurity:    Worry: Not on file    Inability: Not on file  . Transportation needs:    Medical: Not on file    Non-medical: Not on file  Tobacco Use  . Smoking status: Never Smoker  . Smokeless tobacco: Never Used  Substance and Sexual Activity  . Alcohol use: Yes    Alcohol/week: 0.0 standard drinks    Comment: occasional  . Drug use: No   . Sexual activity: Never  Lifestyle  . Physical activity:    Days per week: Not on file    Minutes per session: Not on file  . Stress: Not on file  Relationships  . Social connections:    Talks on phone: Not on file    Gets together: Not on file    Attends religious service: Not on file    Active member of club or organization: Not on file    Attends meetings of clubs or organizations: Not on file    Relationship status: Not on file  Other Topics Concern  . Not on file  Social History Narrative  . Not on file   Family History: Family History  Problem Relation Age of Onset  . Heart disease Mother   . Stroke Mother   . Diabetes Father   . Colon cancer Maternal Grandfather   . Esophageal cancer Neg Hx   . Rectal cancer Neg Hx   . Stomach cancer Neg Hx    Allergies: No Known Allergies Medications: See med rec.  Review of Systems: No fevers, chills, night sweats, weight loss, chest pain, or shortness of breath.   Objective:    General: Well Developed, well nourished, and in no acute distress.  Neuro: Alert and oriented x3, extra-ocular muscles intact, sensation grossly intact.  HEENT: Normocephalic, atraumatic, pupils equal round  reactive to light, neck supple, no masses, no lymphadenopathy, thyroid nonpalpable.  Skin: Warm and dry, no rashes. Cardiac: Regular rate and rhythm, no murmurs rubs or gallops, no lower extremity edema.  Respiratory: Clear to auscultation bilaterally. Not using accessory muscles, speaking in full sentences. Bilateral feet: Diffusely swollen, mostly over the midfoot. Range of motion is full in all directions. Strength is 5/5 in all directions. No hallux valgus. Pes planus. No abnormal callus noted. No pain over the navicular prominence, or base of fifth metatarsal. No tenderness to palpation of the calcaneal insertion of plantar fascia. No pain at the Achilles insertion. No pain over the calcaneal bursa. No pain of the retrocalcaneal  bursa. No tenderness to palpation over the tarsals, metatarsals, or phalanges. No hallux rigidus or limitus. No tenderness palpation over interphalangeal joints. No pain with compression of the metatarsal heads. Neurovascularly intact distally.  Impression and Recommendations:    Pes planus, congenital Awaiting MRI of the right foot and ankle. He does likely have some intertarsal degenerative changes as well as low level crystalline arthropathy. Referral for custom molded orthotics.    ___________________________________________ Gwen Her. Dianah Field, M.D., ABFM., CAQSM. Primary Care and Sports Medicine [facility]  Adjunct Professor of [employer] [facility] of Medicine

## 2018-10-25 ENCOUNTER — Ambulatory Visit (INDEPENDENT_AMBULATORY_CARE_PROVIDER_SITE_OTHER): Payer: BLUE CROSS/BLUE SHIELD

## 2018-10-25 ENCOUNTER — Other Ambulatory Visit: Payer: Self-pay

## 2018-10-25 DIAGNOSIS — M25571 Pain in right ankle and joints of right foot: Secondary | ICD-10-CM

## 2018-10-25 DIAGNOSIS — M25471 Effusion, right ankle: Secondary | ICD-10-CM | POA: Diagnosis not present

## 2018-10-25 DIAGNOSIS — M25474 Effusion, right foot: Secondary | ICD-10-CM

## 2018-10-25 DIAGNOSIS — M19071 Primary osteoarthritis, right ankle and foot: Secondary | ICD-10-CM | POA: Diagnosis not present

## 2018-10-26 ENCOUNTER — Encounter: Payer: Self-pay | Admitting: Sports Medicine

## 2018-10-26 NOTE — Telephone Encounter (Signed)
Marisue Ivan please advise on update, thank you.

## 2018-10-27 NOTE — Telephone Encounter (Signed)
Waiting for updates from [place]

## 2018-10-28 ENCOUNTER — Ambulatory Visit (INDEPENDENT_AMBULATORY_CARE_PROVIDER_SITE_OTHER): Payer: BLUE CROSS/BLUE SHIELD

## 2018-10-28 ENCOUNTER — Other Ambulatory Visit: Payer: Self-pay

## 2018-10-28 ENCOUNTER — Ambulatory Visit: Payer: Self-pay | Admitting: Sports Medicine

## 2018-10-28 DIAGNOSIS — R634 Abnormal weight loss: Secondary | ICD-10-CM

## 2018-10-28 DIAGNOSIS — R918 Other nonspecific abnormal finding of lung field: Secondary | ICD-10-CM | POA: Diagnosis not present

## 2018-10-28 IMAGING — DX DG CHEST 2V
2 series · 2 of 2 positions shown · non-contrast
Comparison: None.

CLINICAL DATA: Cough and congestion

EXAM:
CHEST - 2 VIEW

[chest pa]
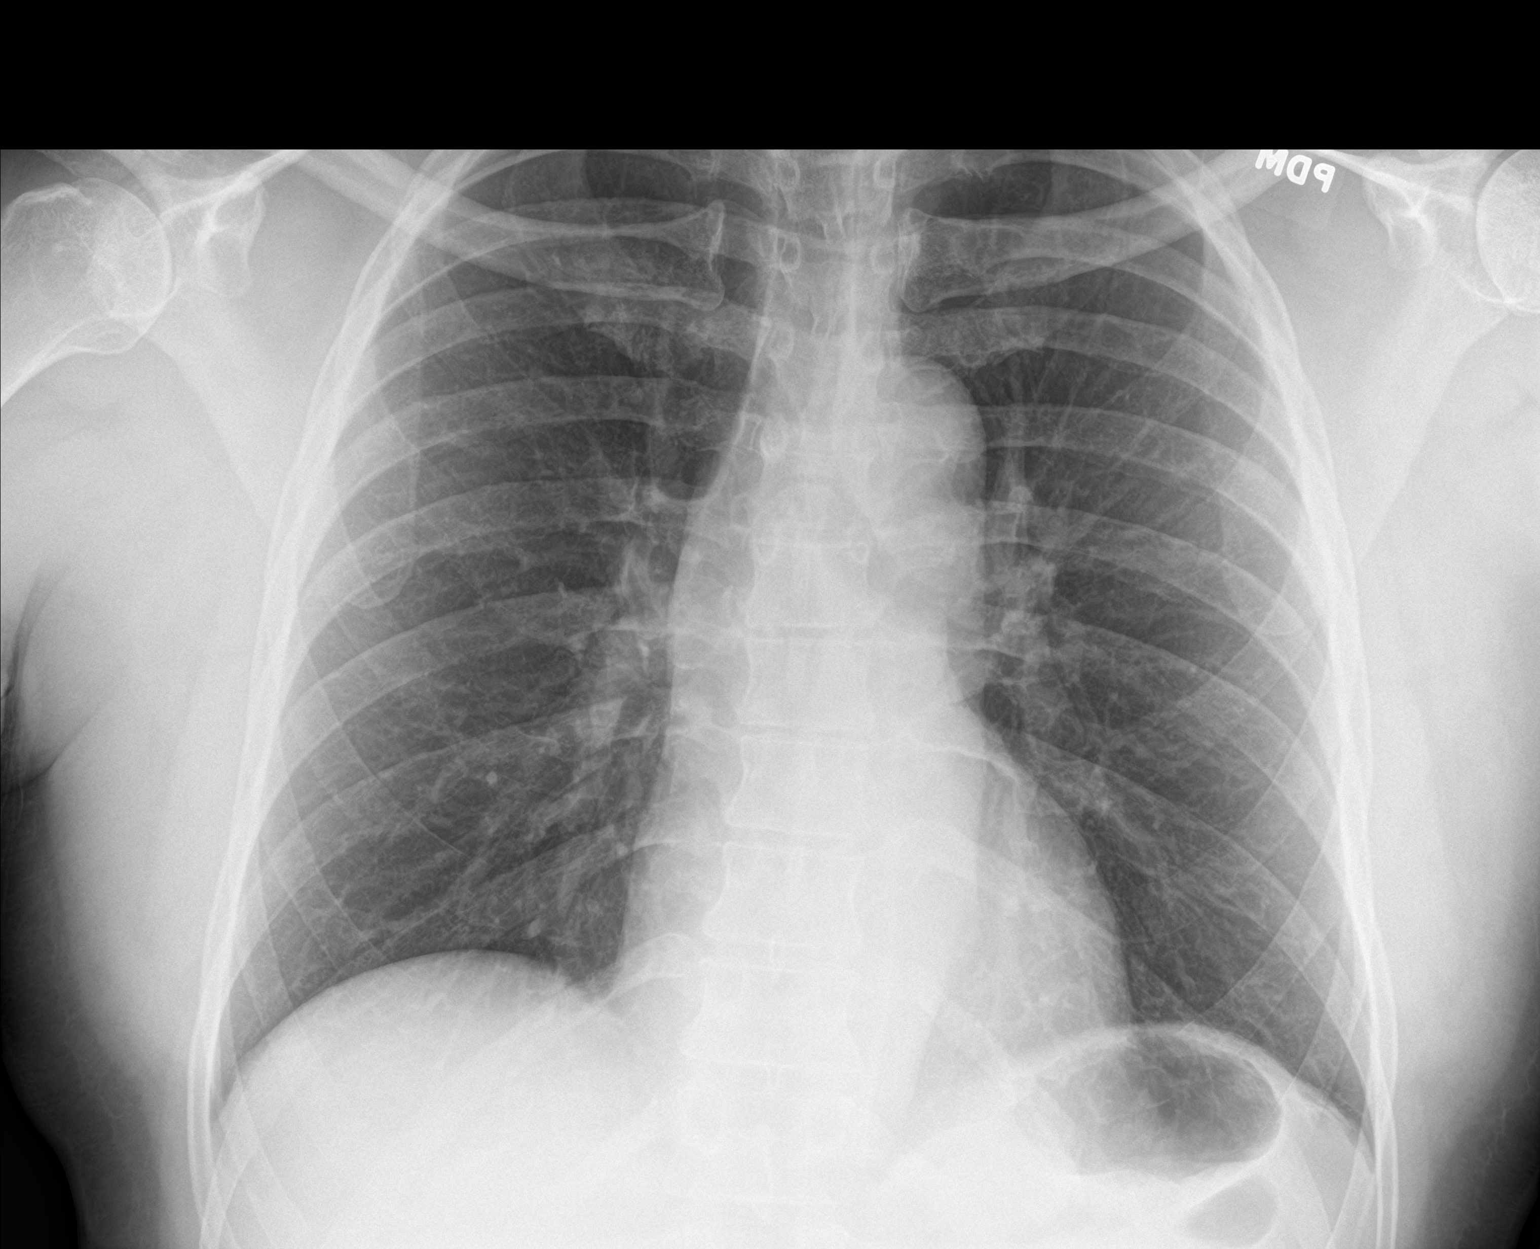

[chest lat]
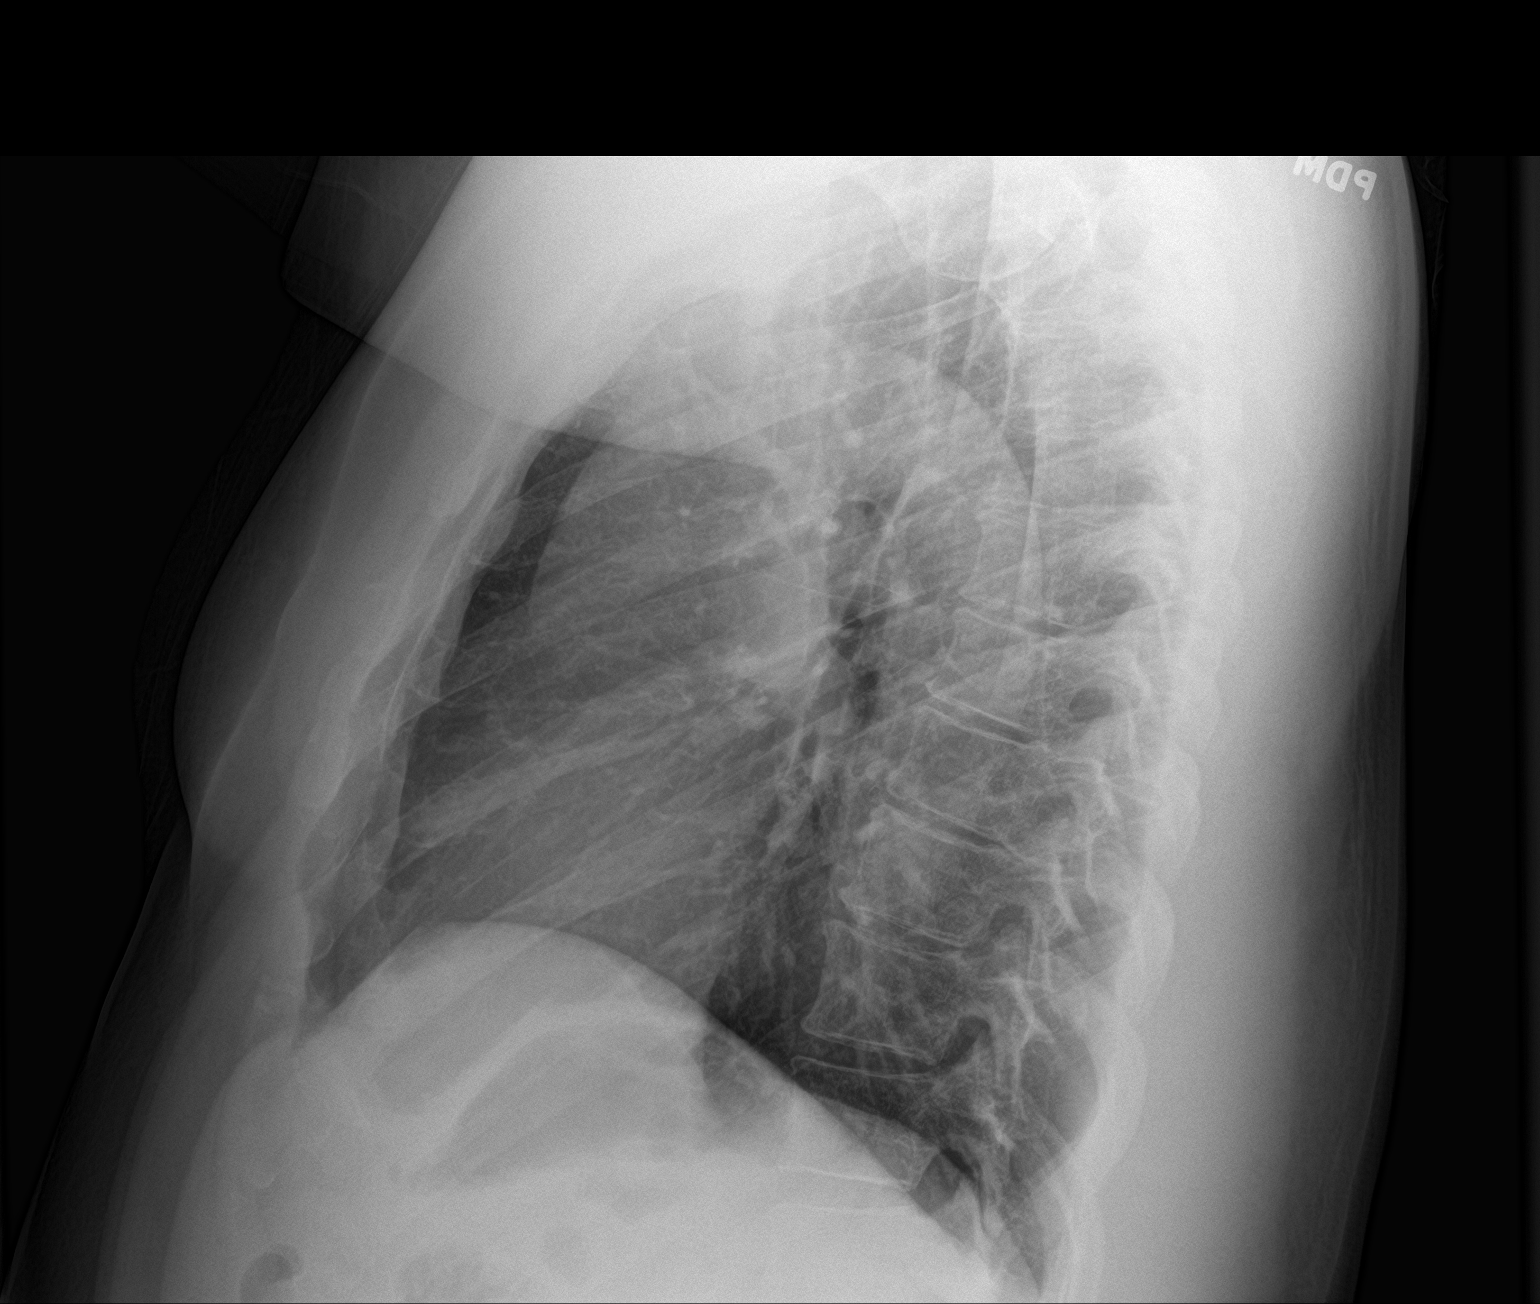

[2 of 2 positions shown; findings below may reference images not displayed]

FINDINGS: The heart size and mediastinal contours are within normal limits.
Both lungs are clear. The visualized skeletal structures are
unremarkable.
IMPRESSION: No active cardiopulmonary disease.

## 2018-10-29 ENCOUNTER — Encounter: Payer: Self-pay | Admitting: Sports Medicine

## 2018-11-02 NOTE — Telephone Encounter (Signed)
I believe the bill he is referring to is for an allergy panel.

## 2018-11-03 ENCOUNTER — Encounter: Payer: Self-pay | Admitting: Sports Medicine

## 2018-11-03 MED ORDER — CELECOXIB 200 MG PO CAPS: ORAL_CAPSULE | ORAL | 2 refills | Status: DC

## 2018-11-03 MED ORDER — DICLOFENAC SODIUM 2 % TD SOLN: 2.0000 | Freq: Two times a day (BID) | TRANSDERMAL | 11 refills | Status: DC

## 2018-11-03 NOTE — Telephone Encounter (Signed)
Please see Dr. Thurston Hole notation below. He requested for you to contact the patient directly to answer billing question.

## 2018-11-04 ENCOUNTER — Encounter: Payer: Self-pay | Admitting: Sports Medicine

## 2018-11-04 ENCOUNTER — Ambulatory Visit (INDEPENDENT_AMBULATORY_CARE_PROVIDER_SITE_OTHER): Payer: BLUE CROSS/BLUE SHIELD | Admitting: Sports Medicine

## 2018-11-04 ENCOUNTER — Other Ambulatory Visit: Payer: Self-pay

## 2018-11-04 DIAGNOSIS — M6580 Other synovitis and tenosynovitis, unspecified site: Secondary | ICD-10-CM | POA: Diagnosis not present

## 2018-11-04 DIAGNOSIS — M119 Crystal arthropathy, unspecified: Secondary | ICD-10-CM | POA: Diagnosis not present

## 2018-11-04 NOTE — Progress Notes (Signed)
Subjective:    CC: Left ankle pain  HPI: Jeremiah Webb is a pleasant 58 year old male, over the weekend he had a bunch of hamburgers, he developed severe pain on the posterior medial aspect of his left ankle, with radiation to the foot.  This feels like gout flare to him.  Symptoms are severe, localized without radiation.  I reviewed the past medical history, family history, social history, surgical history, and allergies today and no changes were needed.  Please see the problem list section below in epic for further details.  Past Medical History: Past Medical History:  Diagnosis Date  . Benign essential hypertension 08/11/2018  . Hypothyroidism   . Thyroid disease    Hypothyroid   Past Surgical History: Past Surgical History:  Procedure Laterality Date  . CERVICAL DISC SURGERY     #5  . POSTERIOR CERVICAL LAMINECTOMY WITH MET- RX Left 09/20/2014   Procedure: Left C7-T1 "Sitting" Microdiskectomy;  Surgeon: Kristeen Miss, MD;  Location: [facility];  Service: Neurosurgery;  Laterality: Left;  Left C7-T1 "Sitting" Microdiskectomy  . QUADRICEPS TENDON REPAIR    . SHOULDER SURGERY     Social History: Social History   Socioeconomic History  . Marital status: Single    Spouse name: Not on file  . Number of children: Not on file  . Years of education: Not on file  . Highest education level: Not on file  Occupational History  . Not on file  Social Needs  . Financial resource strain: Not on file  . Food insecurity:    Worry: Not on file    Inability: Not on file  . Transportation needs:    Medical: Not on file    Non-medical: Not on file  Tobacco Use  . Smoking status: Never Smoker  . Smokeless tobacco: Never Used  Substance and Sexual Activity  . Alcohol use: Yes    Alcohol/week: 0.0 standard drinks    Comment: occasional  . Drug use: No  . Sexual activity: Never  Lifestyle  . Physical activity:    Days per week: Not on file    Minutes per session: Not on file  . Stress:  Not on file  Relationships  . Social connections:    Talks on phone: Not on file    Gets together: Not on file    Attends religious service: Not on file    Active member of club or organization: Not on file    Attends meetings of clubs or organizations: Not on file    Relationship status: Not on file  Other Topics Concern  . Not on file  Social History Narrative  . Not on file   Family History: Family History  Problem Relation Age of Onset  . Heart disease Mother   . Stroke Mother   . Diabetes Father   . Colon cancer Maternal Grandfather   . Esophageal cancer Neg Hx   . Rectal cancer Neg Hx   . Stomach cancer Neg Hx    Allergies: No Known Allergies Medications: See med rec.  Review of Systems: No fevers, chills, night sweats, weight loss, chest pain, or shortness of breath.   Objective:    General: Well Developed, well nourished, and in no acute distress.  Neuro: Alert and oriented x3, extra-ocular muscles intact, sensation grossly intact.  HEENT: Normocephalic, atraumatic, pupils equal round reactive to light, neck supple, no masses, no lymphadenopathy, thyroid nonpalpable.  Skin: Warm and dry, no rashes. Cardiac: Regular rate and rhythm, no murmurs rubs or  gallops, no lower extremity edema.  Respiratory: Clear to auscultation bilaterally. Not using accessory muscles, speaking in full sentences. Left ankle: Swollen, hot, red behind the medial malleolus. Range of motion is full in all directions. Strength is 5/5 in all directions. Stable lateral and medial ligaments; squeeze test and kleiger test unremarkable; Talar dome nontender; No pain at base of 5th MT; No tenderness over cuboid; No tenderness over N spot or navicular prominence No tenderness on posterior aspects of lateral and medial malleolus No sign of peroneal tendon subluxations; Negative tarsal tunnel tinel's Able to walk 4 steps.  Procedure: Real-time Ultrasound Guided injection of the left tibialis  posterior tendon sheath Device: GE Logiq E  Verbal informed consent obtained.  Time-out conducted.  Noted no overlying erythema, induration, or other signs of local infection.  Skin prepped in a sterile fashion.  Local anesthesia: Topical Ethyl chloride.  With sterile technique and under real time ultrasound guidance:  1 cc Kenalog 40, 1 cc lidocaine, 1 cc bupivacaine injected easily taking care to avoid intratendinous injection. Completed without difficulty  Pain immediately resolved suggesting accurate placement of the medication.  Advised to call if fevers/chills, erythema, induration, drainage, or persistent bleeding.  Images permanently stored and available for review in the ultrasound unit.  Impression: Technically successful ultrasound guided injection.  Impression and Recommendations:    Crystalline arthropathy with gout and pseudogout Current pain is referrable to the tibialis posterior tendon sheath. Continues to be resistant to allopurinol/Uloric. Serum uric acid levels were highly elevated. Tibialis posterior tendon sheath injection today, cam boot. He still needs to get his custom orthotics.    ___________________________________________ Gwen Her. Dianah Field, M.D., ABFM., CAQSM. Primary Care and Sports Medicine [facility]  Adjunct Professor of [employer]

## 2018-11-04 NOTE — Assessment & Plan Note (Addendum)
Current pain is referrable to the tibialis posterior tendon sheath. Continues to be resistant to allopurinol/Uloric. Serum uric acid levels were highly elevated. Tibialis posterior tendon sheath injection today, cam boot. He still needs to get his custom orthotics.

## 2018-11-10 NOTE — Telephone Encounter (Signed)
Jeremiah Webb, please advise if there is a way we can help you with patients questions. If needed let us know if we need to contact insurance company or if this is something you can take care of. Thank you for your help.

## 2018-11-13 NOTE — Telephone Encounter (Signed)
I am working on this.  I have attempted to call [facility], however with COVID they are having an influx of calls and the wait times were in the hours.  Will attempt to call again on Monday.

## 2018-11-23 DIAGNOSIS — M25661 Stiffness of right knee, not elsewhere classified: Secondary | ICD-10-CM | POA: Diagnosis not present

## 2018-11-23 DIAGNOSIS — Z96651 Presence of right artificial knee joint: Secondary | ICD-10-CM | POA: Diagnosis not present

## 2018-12-08 NOTE — Telephone Encounter (Signed)
Liz, please advise if this has been able to be taken care of for pt. Thanks! 

## 2018-12-21 ENCOUNTER — Other Ambulatory Visit: Payer: Self-pay | Admitting: Emergency Medicine

## 2018-12-21 DIAGNOSIS — E079 Disorder of thyroid, unspecified: Secondary | ICD-10-CM

## 2018-12-21 NOTE — Telephone Encounter (Signed)
Pt requesting to change pharmacies Requested Prescriptions  Pending Prescriptions Disp Refills  . levothyroxine (SYNTHROID) 150 MCG tablet [Pharmacy Med Name: Levothyroxine Sodium 150 MCG Oral Tablet] 90 tablet 2    Sig: TAKE 1 TABLET BY MOUTH ONCE DAILY ** NEED OFFICE VISIT FOR ADDITIONAL REFILLS**     Endocrinology:  Hypothyroid Agents Failed - 12/21/2018  9:11 AM      Failed - TSH needs to be rechecked within 3 months after an abnormal result. Refill until TSH is due.      Failed - TSH in normal range and within 360 days    TSH  Date Value Ref Range Status  09/16/2018 6.040 (H) 0.450 - 4.500 uIU/mL Final         Passed - Valid encounter within last 12 months    Recent Outpatient Visits          1 month ago Crystalline arthropathy with gout and pseudogout   [place] [facility], Ihor Austin, MD   2 months ago Congenital pes planus, unspecified laterality   [place] [facility], Ihor Austin, MD   2 months ago Abnormal weight loss   [facility], Ihor Austin, MD   2 months ago Acute right ankle pain   [place] [facility], Ihor Austin, MD   3 months ago Acute gouty arthritis   [facility], Eilleen Kempf, MD

## 2018-12-22 NOTE — Telephone Encounter (Signed)
Marisue Ivan, have you heard anything from [facility]?

## 2018-12-28 NOTE — Telephone Encounter (Signed)
Marisue Ivan, please provide update. Thank you

## 2019-01-01 ENCOUNTER — Encounter: Payer: Self-pay | Admitting: Sports Medicine

## 2019-01-08 NOTE — Telephone Encounter (Signed)
Marisue Ivan, please advise if you have an update on this. Thanks!

## 2019-01-20 NOTE — Telephone Encounter (Signed)
Kathlee Nations, have you been able to obtain any update from [employer] on this? Dr. Melvyn Novas responded to this 10/19/18. Thank you much.

## 2019-01-26 NOTE — Telephone Encounter (Signed)
Called and spoke with pt stating to him that Kathlee Nations has been working with [facility] in regards to the bill that he received. Stated to him that we would check with her to see if all has been able to be taken care of and pt verbalized understanding.  Kathlee Nations, please advise on this if the bill has been able to be taken care of that pt received. Thank you!

## 2019-01-29 NOTE — Telephone Encounter (Signed)
Spoke with pt, he states he had not heard back from [place] yet about his account. I will email her since she does not work in [employer] with billing and may not see this message.

## 2019-02-02 NOTE — Telephone Encounter (Signed)
No response from Jeremiah Webb about this pt in my email.

## 2019-02-02 NOTE — Telephone Encounter (Signed)
Jeremiah Webb, please advise if you received a response back from [place] [place] after you emailed her. Thanks!

## 2019-02-03 NOTE — Telephone Encounter (Signed)
Waiting on Kathlee Nations to respond.

## 2019-02-08 NOTE — Telephone Encounter (Signed)
Still awaiting on a response from Kathlee Nations in regards to the billing codes. Kathlee Nations, please advise on this for pt. Thanks!

## 2019-02-10 NOTE — Telephone Encounter (Signed)
Attempted to call pt to let him know that Kathlee Nations should still be working on the billing codes for pt but unable to reach and unable to leave a VM. If pt returns call, we can let him know that Kathlee Nations should be working on this matter and we will update him when we can.  Waiting on response from Kathlee Nations in regards to this.

## 2019-02-15 NOTE — Telephone Encounter (Signed)
Kathlee Nations, please advise if all has been taken care of for pt in regards to the billing codes or if you have an update on this for pt. Thanks!

## 2019-02-22 NOTE — Telephone Encounter (Signed)
Kathlee Nations, please advise if all has been taken care of in regards to pt's billing codes. Thanks!

## 2019-02-23 ENCOUNTER — Other Ambulatory Visit: Payer: Self-pay | Admitting: *Deleted

## 2019-02-23 DIAGNOSIS — Z20822 Contact with and (suspected) exposure to covid-19: Secondary | ICD-10-CM

## 2019-02-25 NOTE — Telephone Encounter (Signed)
Patient called back.  His [facility] invoice number is [id number].

## 2019-02-25 NOTE — Telephone Encounter (Signed)
Called and spoke with patient, He is going to find a copy of his bill so he can get me an invoice number, I will need that to call [facility].

## 2019-02-25 NOTE — Telephone Encounter (Signed)
Called and spoke with pt checking to see if all had been taken care of yet in regards to the bill he had received. Pt stated that he was still waiting and stated that he had not heard anything from Kathlee Nations in regards to an update.  Kathlee Nations, please advise on this for pt as he is wanting to know if we have an update for him. Thanks!

## 2019-02-28 LAB — NOVEL CORONAVIRUS, NAA: SARS-CoV-2, NAA: NOT DETECTED

## 2019-03-02 NOTE — Telephone Encounter (Signed)
Pt is aware that Kathlee Nations is working on this for him.

## 2019-03-03 NOTE — Telephone Encounter (Signed)
Kathlee Nations is working on this. Will await an update from [place] after she speaks with patient.

## 2019-03-04 NOTE — Telephone Encounter (Signed)
Will await response from Liz. 

## 2019-03-10 NOTE — Telephone Encounter (Signed)
Still waiting an update from Kathlee Nations in regards to this and pt is aware that we are working on it.

## 2019-03-12 NOTE — Telephone Encounter (Signed)
Jeremiah Webb said she would work on this 7/31

## 2019-03-16 NOTE — Telephone Encounter (Signed)
Jeremiah Webb, please advise if you have an update. Thanks!

## 2019-03-17 ENCOUNTER — Other Ambulatory Visit: Payer: Self-pay

## 2019-03-17 DIAGNOSIS — Z20822 Contact with and (suspected) exposure to covid-19: Secondary | ICD-10-CM

## 2019-03-18 LAB — NOVEL CORONAVIRUS, NAA: SARS-CoV-2, NAA: NOT DETECTED

## 2019-03-22 NOTE — Telephone Encounter (Signed)
Jeremiah Webb is still currently working this.

## 2019-03-30 NOTE — Telephone Encounter (Signed)
Jeremiah Webb, please advise if all has been taken care of and if encounter can be closed

## 2019-03-31 NOTE — Telephone Encounter (Signed)
[facility], spoke with Judson Roch, she stated that the test was denied for frequency limitations and that they interpreted that to mean he had an allergy panel drawn before.  I told her I did not think that was what the denial meant, I thought they were denying the number of units (24) that had been billed and we probably could have appealed that or added a repeat lab modifier and gotten at least some of it paid, she strongly disagreed.  Unfortunately there is nothing further I can do about it.  Contacted Mr. Brenton, explained all this to him.  He understands there is nothing more to be done.

## 2019-03-31 NOTE — Telephone Encounter (Signed)
Spoke with Kathlee Nations she is going to follow up on this today 03/31/19

## 2019-04-09 NOTE — Addendum Note (Signed)
Addended by: Brigitte Pulse on: 04/09/2019 03:11 PM   Modules accepted: Orders

## 2019-05-03 ENCOUNTER — Ambulatory Visit (INDEPENDENT_AMBULATORY_CARE_PROVIDER_SITE_OTHER): Payer: Self-pay

## 2019-05-03 ENCOUNTER — Encounter (HOSPITAL_COMMUNITY): Payer: Self-pay

## 2019-05-03 ENCOUNTER — Ambulatory Visit (HOSPITAL_COMMUNITY)
Admission: EM | Admit: 2019-05-03 | Discharge: 2019-05-03 | Disposition: A | Discharge status: Home / Self Care | Payer: Self-pay | Attending: Emergency Medicine | Admitting: Emergency Medicine

## 2019-05-03 ENCOUNTER — Other Ambulatory Visit: Payer: Self-pay

## 2019-05-03 DIAGNOSIS — M25562 Pain in left knee: Secondary | ICD-10-CM

## 2019-05-03 DIAGNOSIS — I10 Essential (primary) hypertension: Secondary | ICD-10-CM

## 2019-05-03 NOTE — ED Provider Notes (Signed)
[facility]    CSN: [id number] Arrival date & time: 05/03/19  1622      History   Chief Complaint Chief Complaint  Patient presents with  . Knee Pain    HPI Jeremiah Webb is a 58 y.o. male.   Patient presents with left knee pain x4 days which began after he "bumped" it.  He denies numbness, weakness, paresthesias in LE.  He states he has recurrent pain in his knees.  History of total knee replacement on the right side.  The history is provided by the patient.    Past Medical History:  Diagnosis Date  . Benign essential hypertension 08/11/2018  . Hypothyroidism   . Thyroid disease    Hypothyroid    Patient Active Problem List   Diagnosis Date Noted  . Pes planus, congenital 10/19/2018  . Abnormal weight loss 10/14/2018  . Acute right ankle pain 09/16/2018  . Cellulitis 08/23/2018  . S/P TKR (total knee replacement), right  1/20 08/23/2018  . Renal insufficiency, mild 08/23/2018  . Benign essential hypertension 08/11/2018  . Preoperative evaluation of a medical condition to rule out surgical contraindications (TAR required) 06/10/2018  . Upper airway cough syndrome 01/30/2018  . Annual physical exam 12/22/2017  . Thyroid disease 08/26/2017  . Acquired cyst of kidney 01/20/2017  . Right shoulder pain 06/24/2016  . Male hypogonadism 06/06/2016  . Infrapatellar bursitis of right knee 12/19/2015  . Crystalline arthropathy with gout and pseudogout 09/22/2015  . Palpitations 02/14/2015  . Degenerative disc disease, cervical 04/05/2014    Past Surgical History:  Procedure Laterality Date  . CERVICAL DISC SURGERY     #5  . POSTERIOR CERVICAL LAMINECTOMY WITH MET- RX Left 09/20/2014   Procedure: Left C7-T1 "Sitting" Microdiskectomy;  Surgeon: Kristeen Miss, MD;  Location: [place];  Service: Neurosurgery;  Laterality: Left;  Left C7-T1 "Sitting" Microdiskectomy  . QUADRICEPS TENDON REPAIR    . SHOULDER SURGERY         Home Medications    Prior to  Admission medications   Medication Sig Start Date End Date Taking? Authorizing Provider  celecoxib (CELEBREX) 200 MG capsule One to 2 tablets by mouth daily as needed for pain. 11/03/18   Silverio Decamp, MD  Diclofenac Sodium 2 % SOLN Place 2 sprays onto the skin 2 (two) times daily. 11/03/18   Silverio Decamp, MD  levothyroxine (SYNTHROID) 150 MCG tablet TAKE 1 TABLET BY MOUTH ONCE DAILY ** NEED OFFICE VISIT FOR ADDITIONAL REFILLS** 12/21/18   Horald Pollen, MD  Multiple Vitamins-Minerals (MENS MULTIVITAMIN PO) Take 1 tablet by mouth daily.    [provider]  testosterone cypionate (DEPOTESTOSTERONE CYPIONATE) 200 MG/ML injection Inject 0.6 mLs (120 mg total) into the muscle every 7 (seven) days. Pls include 18g needle for drawing up, 23g needle for injection. 08/17/18   Silverio Decamp, MD    Family History Family History  Problem Relation Age of Onset  . Heart disease Mother   . Stroke Mother   . Diabetes Father   . Colon cancer Maternal Grandfather   . Esophageal cancer Neg Hx   . Rectal cancer Neg Hx   . Stomach cancer Neg Hx     Social History Social History   Tobacco Use  . Smoking status: Never Smoker  . Smokeless tobacco: Never Used  Substance Use Topics  . Alcohol use: Yes    Alcohol/week: 0.0 standard drinks    Comment: occasional  . Drug use: No  Allergies   Patient has no known allergies.   Review of Systems Review of Systems  Constitutional: Negative for chills and fever.  HENT: Negative for ear pain and sore throat.   Eyes: Negative for pain and visual disturbance.  Respiratory: Negative for cough and shortness of breath.   Cardiovascular: Negative for chest pain and palpitations.  Gastrointestinal: Negative for abdominal pain and vomiting.  Genitourinary: Negative for dysuria and hematuria.  Musculoskeletal: Positive for arthralgias. Negative for back pain.  Skin: Negative for color change and rash.  Neurological:  Negative for seizures and syncope.  All other systems reviewed and are negative.    Physical Exam Triage Vital Signs ED Triage Vitals  Enc Vitals Group     BP 05/03/19 1701 (!) 157/107     Pulse Rate 05/03/19 1701 (!) 56     Resp --      Temp 05/03/19 1701 98.2 F (36.8 C)     Temp Source 05/03/19 1701 Oral     SpO2 05/03/19 1701 100 %     Weight --      Height --      Head Circumference --      Peak Flow --      Pain Score 05/03/19 1703 6     Pain Loc --      Pain Edu? --      Excl. in [place]? --    No data found.  Updated Vital Signs BP (!) 157/107 (BP Location: Right Arm)   Pulse (!) 56   Temp 98.2 F (36.8 C) (Oral)   SpO2 100%   Visual Acuity Right Eye Distance:   Left Eye Distance:   Bilateral Distance:    Right Eye Near:   Left Eye Near:    Bilateral Near:     Physical Exam Vitals signs and nursing note reviewed.  Constitutional:      Appearance: He is well-developed.  HENT:     Head: Normocephalic and atraumatic.  Eyes:     Conjunctiva/sclera: Conjunctivae normal.  Neck:     Musculoskeletal: Neck supple.  Cardiovascular:     Rate and Rhythm: Normal rate and regular rhythm.     Heart sounds: No murmur.  Pulmonary:     Effort: Pulmonary effort is normal. No respiratory distress.     Breath sounds: Normal breath sounds.  Abdominal:     Palpations: Abdomen is soft.     Tenderness: There is no abdominal tenderness.  Musculoskeletal: Normal range of motion.        General: Tenderness present. No swelling or deformity.     Right lower leg: No edema.     Left lower leg: No edema.     Comments: Tender over medial left knee.    Skin:    General: Skin is warm and dry.     Capillary Refill: Capillary refill takes less than 2 seconds.     Findings: No bruising, erythema, lesion or rash.  Neurological:     General: No focal deficit present.     Mental Status: He is alert and oriented to person, place, and time.     Sensory: No sensory deficit.      Gait: Gait normal.      UC Treatments / Results  Labs (all labs ordered are listed, but only abnormal results are displayed) Labs Reviewed - No data to display  EKG   Radiology Dg Knee Complete 4 Views Left  Result Date: 05/03/2019 CLINICAL DATA:  Left knee pain.  Pain for 4 days after hitting knee 5 days ago. EXAM: LEFT KNEE - COMPLETE 4+ VIEW COMPARISON:  Included left knee from right knee exam 08/20/2018. Left knee radiograph report 01/04/2016, images not available. FINDINGS: No fracture or dislocation. Tricompartmental peripheral spurring. Chronic bony fragmentation about the medial patellar facet may represent remote prior injury. Lateral patellofemoral joint space narrowing. Chronic cortical irregularity about the medial femoral condyle in the region of the MCL insertion suggesting remote prior injury. Thickening in the region of the quadriceps tendon. No significant joint effusion. Mild soft tissue edema anteriorly. IMPRESSION: 1. No acute fracture or osseous abnormality of the left knee. Soft tissue edema anteriorly. 2. Tricompartmental degenerative peripheral spurring. 3. Findings suggesting remote prior injury about the femoral attachment of the MCL and medial patellar facet. Electronically Signed   By: Keith Rake M.D.   On: 05/03/2019 19:06    Procedures Procedures (including critical care time)  Medications Ordered in UC Medications - No data to display  Initial Impression / Assessment and Plan / UC Course  I have reviewed the triage vital signs and the nursing notes.  Pertinent labs & imaging results that were available during my care of the patient were reviewed by me and considered in my medical decision making (see chart for details).    Left knee pain.  Elevated blood pressure with known diagnosis of hypertension.  X-ray of left knee does not show any acute findings but does show chronic issues.  Patient instructed to follow-up with his orthopedist.  Discussed  that he can take Tylenol or ibuprofen as needed for discomfort.  Also discussed with patient that his blood pressure is elevated today needs to be rechecked by his PCP in 2 to 4 weeks.  Patient agrees to plan of care.     Final Clinical Impressions(s) / UC Diagnoses   Final diagnoses:  Acute pain of left knee  Elevated blood pressure reading in office with diagnosis of hypertension     Discharge Instructions     Your x-ray does not show any acute abnormality but does show chronic issues.  Take Tylenol or ibuprofen as needed for your discomfort.  Follow-up with your orthopedist to discuss your knee pain.    Your blood pressure is elevated today at 157/107.  Please have this rechecked by your primary care provider in 2 weeks.          ED Prescriptions    None     I have reviewed the PDMP during this encounter.   Sharion Balloon, NP 05/03/19 1920

## 2019-05-03 NOTE — Discharge Instructions (Addendum)
Your x-ray does not show any acute abnormality but does show chronic issues.  Take Tylenol or ibuprofen as needed for your discomfort.  Follow-up with your orthopedist to discuss your knee pain.    Your blood pressure is elevated today at 157/107.  Please have this rechecked by your primary care provider in 2 weeks.

## 2019-05-03 NOTE — ED Triage Notes (Signed)
Pt presents with recurrent  left knee pain

## 2019-05-26 ENCOUNTER — Encounter: Payer: Self-pay | Admitting: Sports Medicine

## 2019-06-18 IMAGING — MR MRI OF THE RIGHT FOREFOOT WITHOUT CONTRAST
5 series · 40 of 40 positions shown · non-contrast
Comparison: None.

CLINICAL DATA: Foot pain.

EXAM:
MRI OF THE RIGHT FOREFOOT WITHOUT CONTRAST
TECHNIQUE: Multiplanar, multisequence MR imaging of the right foot was
performed. No intravenous contrast was administered.

[Series 3: PD fat-sat · axial · 3.0mm · 0.70mm/px · z∈[-114,+31]mm · 9 of 45 slices shown]
[im 1/45]
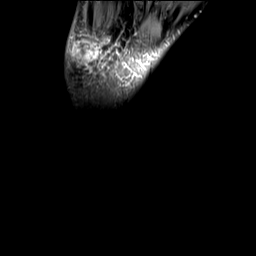
[im 6/45]
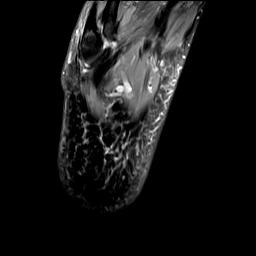
[im 12/45]
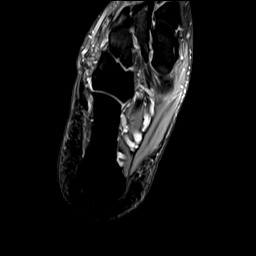
[im 17/45]
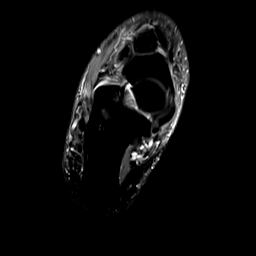
[im 23/45]
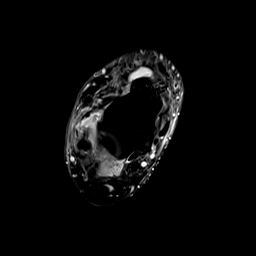
[im 28/45]
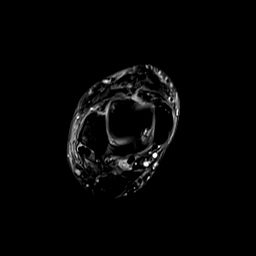
[im 34/45]
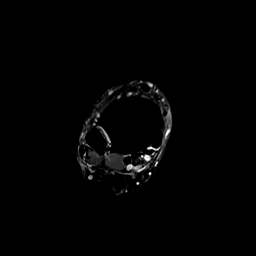
[im 39/45]
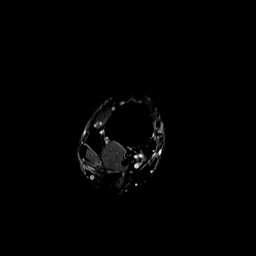
[im 45/45]
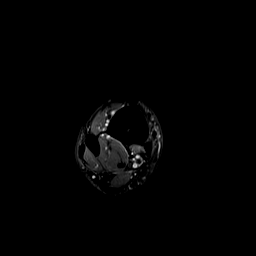

[Series 4: T2 fat-sat · axial · 3.0mm · 0.70mm/px · z∈[-114,+31]mm · 9 of 45 slices shown (1 of 2)]
[im 1/45]
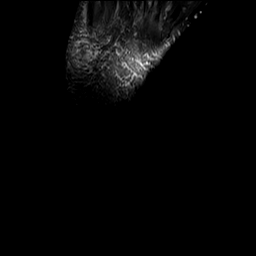
[im 6/45]
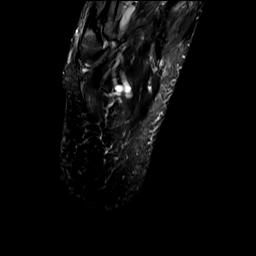
[im 12/45]
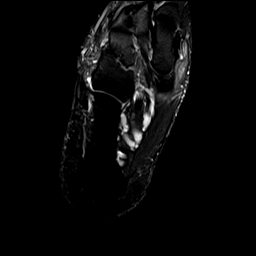
[im 17/45]
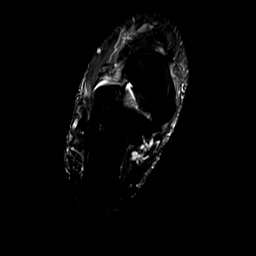
[im 23/45]
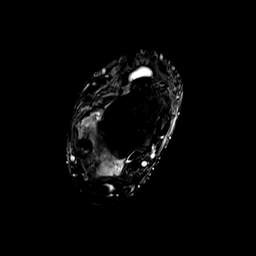
[im 28/45]
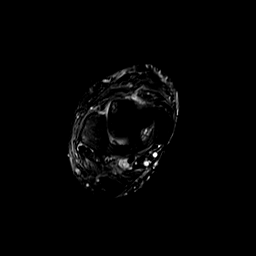
[im 34/45]
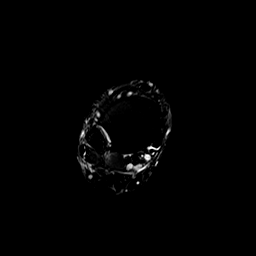
[im 39/45]
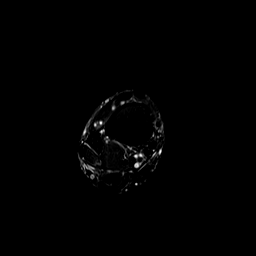
[im 45/45]
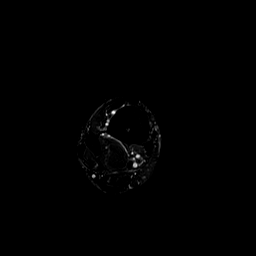

[Series 5: T2 fat-sat · coronal · 3.0mm · 0.70mm/px · 10 of 45 slices shown (2 of 2)]
[im 1/45]
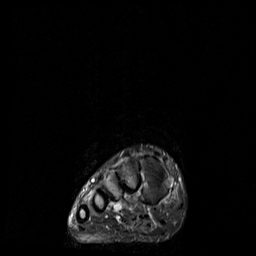
[im 5/45]
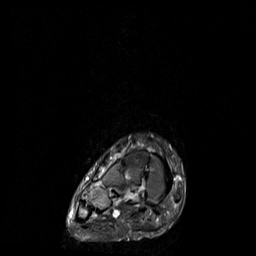
[im 10/45]
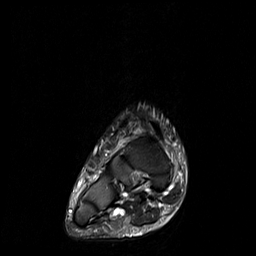
[im 15/45]
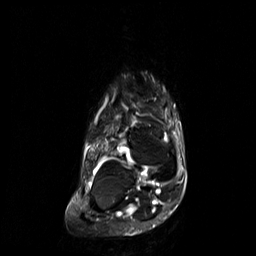
[im 20/45]
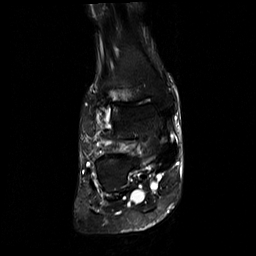
[im 25/45]
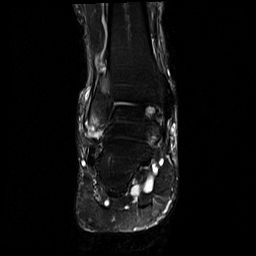
[im 30/45]
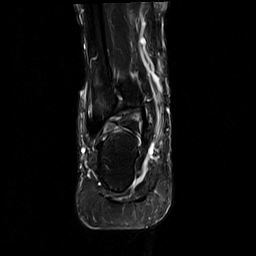
[im 35/45]
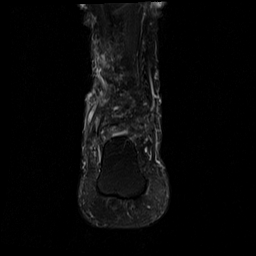
[im 40/45]
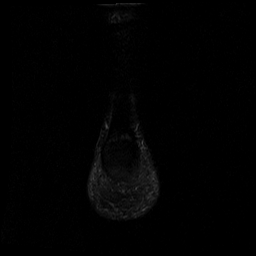
[im 45/45]
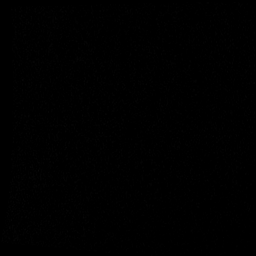

[Series 6: T1 · sagittal · 3.0mm · 0.56mm/px · 6 of 26 slices shown]
[im 1/26]
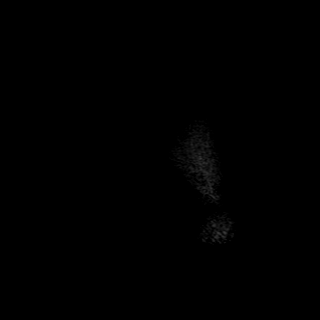
[im 6/26]
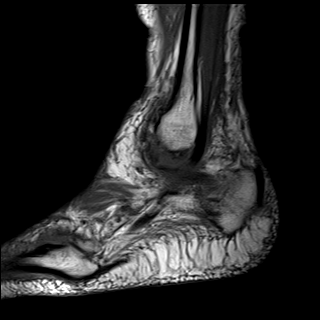
[im 11/26]
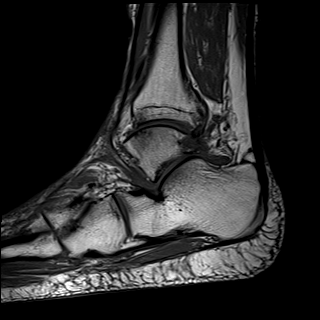
[im 16/26]
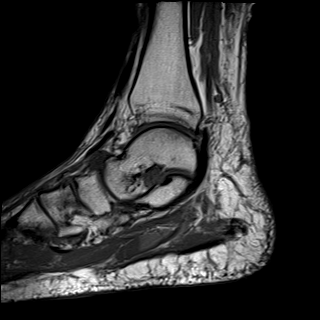
[im 21/26]
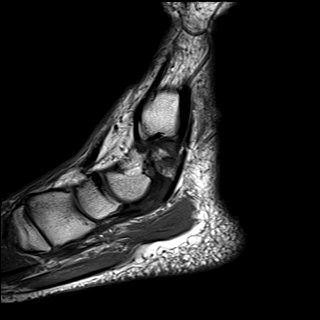
[im 26/26]
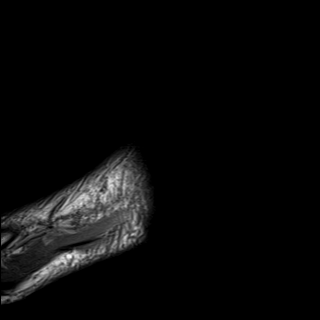

[Series 7: STIR · sagittal · 3.0mm · 0.70mm/px · 6 of 26 slices shown]
[im 1/26]
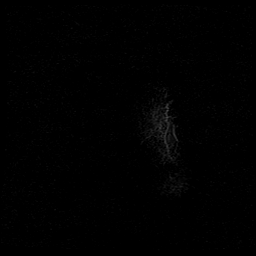
[im 6/26]
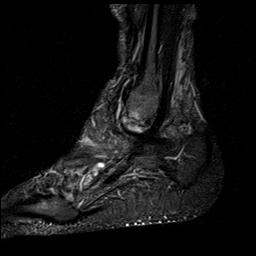
[im 11/26]
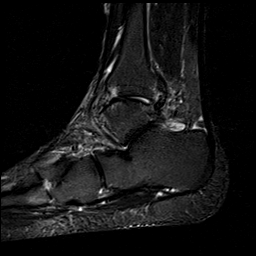
[im 16/26]
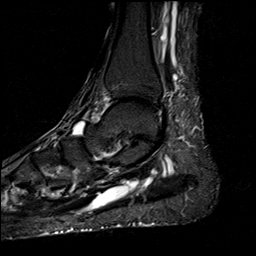
[im 21/26]
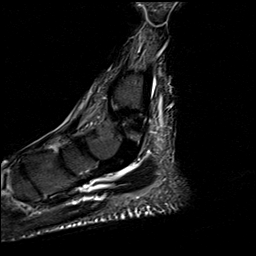
[im 26/26]
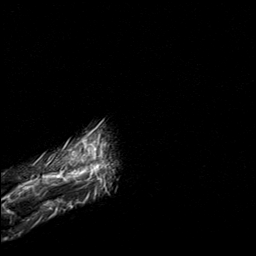

[40 of 40 positions shown; findings below may reference images not displayed]

FINDINGS: The bony structures are intact.  No stress fracture or AVN.

Mild degenerative changes at the first MTP joint but no erosions or
hallux valgus deformity. There is a joint effusion at the second MTP
joint without obvious erosions. The other MTP joints are maintained.
Mild nonspecific signal abnormality in the forefoot muscles
suggesting myositis. No findings for cellulitis or abscess.

The major tendons appear intact.
IMPRESSION: 1. No acute bony abnormalities such as stress fracture or AVN.
2. Degenerative changes and joint effusion at the second MTP joint
without obvious erosions.
3. Mild degenerative changes at the first MTP joint but no erosions
or hallux valgus deformity.
4. Nonspecific myositis.

## 2019-07-09 ENCOUNTER — Encounter: Payer: Self-pay | Admitting: Sports Medicine

## 2019-07-09 DIAGNOSIS — E291 Testicular hypofunction: Secondary | ICD-10-CM

## 2019-07-12 MED ORDER — "HYPODERMIC NEEDLE 18G X 1"" MISC": 11 refills | Status: DC

## 2019-07-12 MED ORDER — "HYPODERMIC NEEDLE 22G X 1-1/2"" MISC": 11 refills | Status: DC

## 2019-07-12 MED ORDER — SYRINGE (DISPOSABLE) 3 ML MISC: 11 refills | Status: DC

## 2019-07-12 MED ORDER — TESTOSTERONE CYPIONATE 200 MG/ML IM SOLN: 120.0000 mg | INTRAMUSCULAR | 0 refills | Status: DC

## 2019-07-12 NOTE — Addendum Note (Signed)
Addended by: Silverio Decamp on: 07/12/2019 05:16 PM   Modules accepted: Orders

## 2019-07-21 ENCOUNTER — Encounter: Payer: Self-pay | Admitting: Sports Medicine

## 2019-07-28 NOTE — Telephone Encounter (Signed)
These really need to go to his neurosurgeon, if he wants me to do them, they need to be filled out in person just like any other extensive disability paperwork.

## 2019-07-30 ENCOUNTER — Ambulatory Visit (INDEPENDENT_AMBULATORY_CARE_PROVIDER_SITE_OTHER): Payer: Self-pay | Admitting: Sports Medicine

## 2019-07-30 ENCOUNTER — Encounter: Payer: Self-pay | Admitting: Sports Medicine

## 2019-07-30 ENCOUNTER — Other Ambulatory Visit: Payer: Self-pay

## 2019-07-30 DIAGNOSIS — M5020 Other cervical disc displacement, unspecified cervical region: Secondary | ICD-10-CM

## 2019-07-30 NOTE — Progress Notes (Signed)
Subjective:    CC: Follow-up  HPI: Immanuel returns, he had a C7-T1 posterior fusion, persistent pain, he is on long-term disability, he needs some paperwork filled out.  I reviewed the past medical history, family history, social history, surgical history, and allergies today and no changes were needed.  Please see the problem list section below in epic for further details.  Past Medical History: Past Medical History:  Diagnosis Date  . Benign essential hypertension 08/11/2018  . Hypothyroidism   . Thyroid disease    Hypothyroid   Past Surgical History: Past Surgical History:  Procedure Laterality Date  . CERVICAL DISC SURGERY     #5  . POSTERIOR CERVICAL LAMINECTOMY WITH MET- RX Left 09/20/2014   Procedure: Left C7-T1 "Sitting" Microdiskectomy;  Surgeon: Kristeen Miss, MD;  Location: [place] [facility];  Service: Neurosurgery;  Laterality: Left;  Left C7-T1 "Sitting" Microdiskectomy  . QUADRICEPS TENDON REPAIR    . SHOULDER SURGERY     Social History: Social History   Socioeconomic History  . Marital status: Single    Spouse name: Not on file  . Number of children: Not on file  . Years of education: Not on file  . Highest education level: Not on file  Occupational History  . Not on file  Tobacco Use  . Smoking status: Never Smoker  . Smokeless tobacco: Never Used  Substance and Sexual Activity  . Alcohol use: Yes    Alcohol/week: 0.0 standard drinks    Comment: occasional  . Drug use: No  . Sexual activity: Never  Other Topics Concern  . Not on file  Social History Narrative  . Not on file   Social Determinants of Health   Financial Resource Strain:   . Difficulty of Paying Living Expenses: Not on file  Food Insecurity:   . Worried About Charity fundraiser in the Last Year: Not on file  . Ran Out of Food in the Last Year: Not on file  Transportation Needs:   . Lack of Transportation (Medical): Not on file  . Lack of Transportation (Non-Medical): Not on file    Physical Activity:   . Days of Exercise per Week: Not on file  . Minutes of Exercise per Session: Not on file  Stress:   . Feeling of Stress : Not on file  Social Connections:   . Frequency of Communication with Friends and Family: Not on file  . Frequency of Social Gatherings with Friends and Family: Not on file  . Attends Religious Services: Not on file  . Active Member of Clubs or Organizations: Not on file  . Attends Archivist Meetings: Not on file  . Marital Status: Not on file   Family History: Family History  Problem Relation Age of Onset  . Heart disease Mother   . Stroke Mother   . Diabetes Father   . Colon cancer Maternal Grandfather   . Esophageal cancer Neg Hx   . Rectal cancer Neg Hx   . Stomach cancer Neg Hx    Allergies: No Known Allergies Medications: See med rec.  Review of Systems: No fevers, chills, night sweats, weight loss, chest pain, or shortness of breath.   Objective:    General: Well Developed, well nourished, and in no acute distress.  Neuro: Alert and oriented x3, extra-ocular muscles intact, sensation grossly intact.  HEENT: Normocephalic, atraumatic, pupils equal round reactive to light, neck supple, no masses, no lymphadenopathy, thyroid nonpalpable.  Skin: Warm and dry, no rashes. Cardiac:  Regular rate and rhythm, no murmurs rubs or gallops, no lower extremity edema.  Respiratory: Clear to auscultation bilaterally. Not using accessory muscles, speaking in full sentences.  Impression and Recommendations:    HNP (herniated nucleus pulposus), cervical .  Cervical DDD post posterior fusion in the distant past, I did fill out some disability paperwork today.   ___________________________________________ Gwen Her. Dianah Field, M.D., ABFM., CAQSM. Primary Care and Sports Medicine [facility]  Adjunct Professor of [place]

## 2019-07-30 NOTE — Assessment & Plan Note (Signed)
.    Cervical DDD post posterior fusion in the distant past, I did fill out some disability paperwork today.

## 2019-08-27 ENCOUNTER — Telehealth: Payer: Self-pay

## 2019-08-27 NOTE — Telephone Encounter (Signed)
Greig Castilla from [facility] Claims department left a vm msg. He stated that disability paperwork pt faxed to them in December was of poor quality. Needs all documents resent to proceed with pt's claim. Greig Castilla has been trying to contact pt numerous times with no answer. I've attempted to contact pt, no answer at time of call. Left a detailed msg for pt with contact office number for Greig Castilla at [employer]. Pt is aware that documents must be re-sent to [facility]. Direct call back info provided to pt in case he has any inquiries.

## 2019-09-29 ENCOUNTER — Other Ambulatory Visit: Payer: Self-pay | Admitting: Emergency Medicine

## 2019-09-29 DIAGNOSIS — M25562 Pain in left knee: Secondary | ICD-10-CM | POA: Diagnosis not present

## 2019-09-29 DIAGNOSIS — E079 Disorder of thyroid, unspecified: Secondary | ICD-10-CM

## 2019-09-29 DIAGNOSIS — Z96651 Presence of right artificial knee joint: Secondary | ICD-10-CM | POA: Diagnosis not present

## 2019-09-29 MED ORDER — LEVOTHYROXINE SODIUM 150 MCG PO TABS: 150.0000 ug | ORAL_TABLET | Freq: Every day | ORAL | 0 refills | Status: DC

## 2019-09-29 NOTE — Telephone Encounter (Signed)
Called pt and canceled appointment for he needs to see his primary care provider for additional refills. We will refill for a 90 day supply this last time.   Pt stated understanding.

## 2019-09-30 ENCOUNTER — Telehealth: Payer: Self-pay | Admitting: Emergency Medicine

## 2019-10-01 ENCOUNTER — Telehealth: Payer: Self-pay | Admitting: Emergency Medicine

## 2019-10-01 NOTE — Telephone Encounter (Signed)
Spoke with patient and he stated he has the medication picked up

## 2019-10-01 NOTE — Telephone Encounter (Signed)
What is the name of the medication? Levothyroxine  Have you contacted your pharmacy to request a refill? Yes , they have not received prescription   Which pharmacy would you like this sent to?   [facility] - [place], [place] - [address]   Patient notified that their request is being sent to the clinical staff for review and that they should receive a call once it is complete. If they do not receive a call within 72 hours they can check with their pharmacy or our office.

## 2019-12-30 ENCOUNTER — Telehealth: Payer: Self-pay | Admitting: *Deleted

## 2019-12-30 DIAGNOSIS — E079 Disorder of thyroid, unspecified: Secondary | ICD-10-CM

## 2019-12-30 DIAGNOSIS — Z13220 Encounter for screening for lipoid disorders: Secondary | ICD-10-CM

## 2019-12-30 DIAGNOSIS — E291 Testicular hypofunction: Secondary | ICD-10-CM

## 2019-12-30 DIAGNOSIS — I10 Essential (primary) hypertension: Secondary | ICD-10-CM

## 2019-12-30 NOTE — Telephone Encounter (Signed)
Pt left vm requesting lab orders for tsh and testosterone.  Upon reviewing his chart I noticed he is due for everything.  Labs pended.

## 2019-12-31 NOTE — Telephone Encounter (Signed)
Signed! Thank ya'll.

## 2019-12-31 NOTE — Telephone Encounter (Signed)
Lab orders faxed.

## 2020-01-03 ENCOUNTER — Other Ambulatory Visit: Payer: Self-pay | Admitting: Emergency Medicine

## 2020-01-03 DIAGNOSIS — E079 Disorder of thyroid, unspecified: Secondary | ICD-10-CM | POA: Diagnosis not present

## 2020-01-03 DIAGNOSIS — I10 Essential (primary) hypertension: Secondary | ICD-10-CM | POA: Diagnosis not present

## 2020-01-03 DIAGNOSIS — Z13220 Encounter for screening for lipoid disorders: Secondary | ICD-10-CM | POA: Diagnosis not present

## 2020-01-03 DIAGNOSIS — E291 Testicular hypofunction: Secondary | ICD-10-CM | POA: Diagnosis not present

## 2020-01-03 NOTE — Telephone Encounter (Signed)
Patient advised.

## 2020-01-04 ENCOUNTER — Encounter: Payer: Self-pay | Admitting: Sports Medicine

## 2020-01-04 ENCOUNTER — Other Ambulatory Visit: Payer: Self-pay | Admitting: Sports Medicine

## 2020-01-04 DIAGNOSIS — E079 Disorder of thyroid, unspecified: Secondary | ICD-10-CM

## 2020-01-04 DIAGNOSIS — R7989 Other specified abnormal findings of blood chemistry: Secondary | ICD-10-CM

## 2020-01-05 MED ORDER — LEVOTHYROXINE SODIUM 150 MCG PO TABS: 150.0000 ug | ORAL_TABLET | Freq: Every day | ORAL | 3 refills | Status: DC

## 2020-01-08 LAB — COMPLETE METABOLIC PANEL WITH GFR
AG Ratio: 1.6 (calc) (ref 1.0–2.5)
ALT: 21 U/L (ref 9–46)
AST: 20 U/L (ref 10–35)
Albumin: 4.1 g/dL (ref 3.6–5.1)
Alkaline phosphatase (APISO): 45 U/L (ref 35–144)
BUN/Creatinine Ratio: 10 (calc) (ref 6–22)
BUN: 16 mg/dL (ref 7–25)
CO2: 24 mmol/L (ref 20–32)
Calcium: 9.4 mg/dL (ref 8.6–10.3)
Chloride: 109 mmol/L (ref 98–110)
Creat: 1.54 mg/dL — ABNORMAL HIGH (ref 0.70–1.33)
GFR, Est African American: 57 mL/min/{1.73_m2} — ABNORMAL LOW (ref >=60)
GFR, Est Non African American: 49 mL/min/{1.73_m2} — ABNORMAL LOW (ref >=60)
Globulin: 2.6 g/dL (calc) (ref 1.9–3.7)
Glucose, Bld: 105 mg/dL — ABNORMAL HIGH (ref 65–99)
Potassium: 4 mmol/L (ref 3.5–5.3)
Sodium: 139 mmol/L (ref 135–146)
Total Bilirubin: 0.6 mg/dL (ref 0.2–1.2)
Total Protein: 6.7 g/dL (ref 6.1–8.1)

## 2020-01-08 LAB — CBC WITH DIFFERENTIAL/PLATELET
Absolute Monocytes: 495 cells/uL (ref 200–950)
Basophils Absolute: 50 cells/uL (ref 0–200)
Basophils Relative: 1 %
Eosinophils Absolute: 375 cells/uL (ref 15–500)
Eosinophils Relative: 7.5 %
HCT: 47.5 % (ref 38.5–50.0)
Hemoglobin: 15.7 g/dL (ref 13.2–17.1)
Lymphs Abs: 1475 cells/uL (ref 850–3900)
MCH: 29.8 pg (ref 27.0–33.0)
MCHC: 33.1 g/dL (ref 32.0–36.0)
MCV: 90.1 fL (ref 80.0–100.0)
MPV: 9.6 fL (ref 7.5–12.5)
Monocytes Relative: 9.9 %
Neutro Abs: 2605 cells/uL (ref 1500–7800)
Neutrophils Relative %: 52.1 %
Platelets: 292 10*3/uL (ref 140–400)
RBC: 5.27 10*6/uL (ref 4.20–5.80)
RDW: 11.8 % (ref 11.0–15.0)
Total Lymphocyte: 29.5 %
WBC: 5 10*3/uL (ref 3.8–10.8)

## 2020-01-08 LAB — PSA: PSA: 1.5 ng/mL (ref <=4.0)

## 2020-01-08 LAB — TEST AUTHORIZATION

## 2020-01-08 LAB — LIPID PANEL W/REFLEX DIRECT LDL
Cholesterol: 139 mg/dL (ref <200)
HDL: 38 mg/dL — ABNORMAL LOW (ref >=40)
LDL Cholesterol (Calc): 85 mg/dL (calc)
Non-HDL Cholesterol (Calc): 101 mg/dL (calc) (ref <130)
Total CHOL/HDL Ratio: 3.7 (calc) (ref <5.0)
Triglycerides: 74 mg/dL (ref <150)

## 2020-01-08 LAB — T3, FREE: T3, Free: 2.4 pg/mL (ref 2.3–4.2)

## 2020-01-08 LAB — TSH: TSH: 6.42 mIU/L — ABNORMAL HIGH (ref 0.40–4.50)

## 2020-01-08 LAB — T4, FREE: Free T4: 0.9 ng/dL (ref 0.8–1.8)

## 2020-01-08 LAB — TESTOSTERONE: Testosterone: 1077 ng/dL — ABNORMAL HIGH (ref 250–827)

## 2020-01-21 ENCOUNTER — Ambulatory Visit: Payer: Self-pay | Admitting: Sports Medicine

## 2020-01-21 NOTE — Telephone Encounter (Signed)
Appointment has been canceled and per voicemail patient to call us back.

## 2020-08-28 ENCOUNTER — Encounter: Payer: Self-pay | Admitting: Sports Medicine

## 2020-08-31 ENCOUNTER — Encounter: Payer: Self-pay | Admitting: Sports Medicine

## 2020-08-31 ENCOUNTER — Ambulatory Visit (INDEPENDENT_AMBULATORY_CARE_PROVIDER_SITE_OTHER): Payer: Managed Care, Other (non HMO) | Admitting: Sports Medicine

## 2020-08-31 ENCOUNTER — Ambulatory Visit (INDEPENDENT_AMBULATORY_CARE_PROVIDER_SITE_OTHER): Payer: Managed Care, Other (non HMO)

## 2020-08-31 ENCOUNTER — Other Ambulatory Visit: Payer: Self-pay

## 2020-08-31 VITALS — BP 143/82 | HR 86 | Ht 73.25 in | Wt 221.0 lb

## 2020-08-31 DIAGNOSIS — S6702XA Crushing injury of left thumb, initial encounter: Secondary | ICD-10-CM

## 2020-08-31 DIAGNOSIS — E291 Testicular hypofunction: Secondary | ICD-10-CM | POA: Diagnosis not present

## 2020-08-31 DIAGNOSIS — I10 Essential (primary) hypertension: Secondary | ICD-10-CM

## 2020-08-31 DIAGNOSIS — R739 Hyperglycemia, unspecified: Secondary | ICD-10-CM

## 2020-08-31 DIAGNOSIS — M79645 Pain in left finger(s): Secondary | ICD-10-CM | POA: Diagnosis not present

## 2020-08-31 DIAGNOSIS — N400 Enlarged prostate without lower urinary tract symptoms: Secondary | ICD-10-CM

## 2020-08-31 DIAGNOSIS — Z0000 Encounter for general adult medical examination without abnormal findings: Secondary | ICD-10-CM | POA: Diagnosis not present

## 2020-08-31 NOTE — Assessment & Plan Note (Signed)
Jeremiah Webb injured his left thumb several months ago, he now has a lack of motion to extension. In comparison to the other side he can hyperextend, technically he now has normal extension at the left IP joint, getting some x-rays though I did explain to him he should probably just live with it.

## 2020-08-31 NOTE — Progress Notes (Signed)
Subjective:    CC: Annual Physical Exam  HPI:  This patient is here for their annual physical  I reviewed the past medical history, family history, social history, surgical history, and allergies today and no changes were needed.  Please see the problem list section below in epic for further details.  Past Medical History: Past Medical History:  Diagnosis Date  . Benign essential hypertension 08/11/2018  . Hypothyroidism   . Thyroid disease    Hypothyroid   Past Surgical History: Past Surgical History:  Procedure Laterality Date  . CERVICAL DISC SURGERY     #5  . POSTERIOR CERVICAL LAMINECTOMY WITH MET- RX Left 09/20/2014   Procedure: Left C7-T1 "Sitting" Microdiskectomy;  Surgeon: Kristeen Miss, MD;  Location: [place] [facility];  Service: Neurosurgery;  Laterality: Left;  Left C7-T1 "Sitting" Microdiskectomy  . QUADRICEPS TENDON REPAIR    . SHOULDER SURGERY     Social History: Social History   Socioeconomic History  . Marital status: Single    Spouse name: Not on file  . Number of children: Not on file  . Years of education: Not on file  . Highest education level: Not on file  Occupational History  . Not on file  Tobacco Use  . Smoking status: Never Smoker  . Smokeless tobacco: Never Used  Substance and Sexual Activity  . Alcohol use: Yes    Alcohol/week: 0.0 standard drinks    Comment: occasional  . Drug use: No  . Sexual activity: Never  Other Topics Concern  . Not on file  Social History Narrative  . Not on file   Social Determinants of Health   Financial Resource Strain: Not on file  Food Insecurity: Not on file  Transportation Needs: Not on file  Physical Activity: Not on file  Stress: Not on file  Social Connections: Not on file   Family History: Family History  Problem Relation Age of Onset  . Heart disease Mother   . Stroke Mother   . Diabetes Father   . Colon cancer Maternal Grandfather   . Esophageal cancer Neg Hx   . Rectal cancer Neg Hx   .  Stomach cancer Neg Hx    Allergies: No Known Allergies Medications: See med rec.  Review of Systems: No headache, visual changes, nausea, vomiting, diarrhea, constipation, dizziness, abdominal pain, skin rash, fevers, chills, night sweats, swollen lymph nodes, weight loss, chest pain, body aches, joint swelling, muscle aches, shortness of breath, mood changes, visual or auditory hallucinations.  Objective:    General: Well Developed, well nourished, and in no acute distress.  Neuro: Alert and oriented x3, extra-ocular muscles intact, sensation grossly intact. Cranial nerves II through XII are intact, motor, sensory, and coordinative functions are all intact. HEENT: Normocephalic, atraumatic, pupils equal round reactive to light, neck supple, no masses, no lymphadenopathy, thyroid nonpalpable. Oropharynx, nasopharynx, external ear canals are unremarkable. Skin: Warm and dry, no rashes noted.  Cardiac: Regular rate and rhythm, no murmurs rubs or gallops.  Respiratory: Clear to auscultation bilaterally. Not using accessory muscles, speaking in full sentences.  Abdominal: Soft, nontender, nondistended, positive bowel sounds, no masses, no organomegaly.  Musculoskeletal: Shoulder, elbow, wrist, hip, knee, ankle stable, and with full range of motion.  Impression and Recommendations:    The patient was counselled, risk factors were discussed, anticipatory guidance given.  Annual physical exam Annual physical exam as above. Checking routine labs. COVID vaccinated now.  Crushing injury of left thumb Jeremiah Webb injured his left thumb several months ago, he  now has a lack of motion to extension. In comparison to the other side he can hyperextend, technically he now has normal extension at the left IP joint, getting some x-rays though I did explain to him he should probably just live with it.   ___________________________________________ Gwen Her. Dianah Field, M.D., ABFM., CAQSM. Primary Care  and Sports Medicine [facility]  Adjunct Professor of [place] [employer] [facility] [employer]

## 2020-08-31 NOTE — Assessment & Plan Note (Signed)
Annual physical exam as above. Checking routine labs. COVID vaccinated now.

## 2020-09-01 ENCOUNTER — Other Ambulatory Visit: Payer: Self-pay | Admitting: Sports Medicine

## 2020-09-01 DIAGNOSIS — R7989 Other specified abnormal findings of blood chemistry: Secondary | ICD-10-CM

## 2020-09-01 DIAGNOSIS — E291 Testicular hypofunction: Secondary | ICD-10-CM

## 2020-09-01 DIAGNOSIS — E079 Disorder of thyroid, unspecified: Secondary | ICD-10-CM

## 2020-09-01 MED ORDER — "HYPODERMIC NEEDLE 18G X 1"" MISC": 11 refills | Status: DC

## 2020-09-01 MED ORDER — SYRINGE (DISPOSABLE) 3 ML MISC: 11 refills | Status: DC

## 2020-09-01 MED ORDER — LEVOTHYROXINE SODIUM 150 MCG PO TABS: 150.0000 ug | ORAL_TABLET | Freq: Every day | ORAL | 3 refills | Status: DC

## 2020-09-01 NOTE — Telephone Encounter (Signed)
Patient called for refills on all his medications.

## 2020-09-03 LAB — CBC
HCT: 46.7 % (ref 38.5–50.0)
Hemoglobin: 15.4 g/dL (ref 13.2–17.1)
MCH: 29.6 pg (ref 27.0–33.0)
MCHC: 33 g/dL (ref 32.0–36.0)
MCV: 89.6 fL (ref 80.0–100.0)
MPV: 9.8 fL (ref 7.5–12.5)
Platelets: 306 10*3/uL (ref 140–400)
RBC: 5.21 10*6/uL (ref 4.20–5.80)
RDW: 11.6 % (ref 11.0–15.0)
WBC: 4.6 10*3/uL (ref 3.8–10.8)

## 2020-09-03 LAB — COMPREHENSIVE METABOLIC PANEL
AG Ratio: 1.5 (calc) (ref 1.0–2.5)
ALT: 19 U/L (ref 9–46)
AST: 19 U/L (ref 10–35)
Albumin: 4.1 g/dL (ref 3.6–5.1)
Alkaline phosphatase (APISO): 45 U/L (ref 35–144)
BUN/Creatinine Ratio: 10 (calc) (ref 6–22)
BUN: 14 mg/dL (ref 7–25)
CO2: 27 mmol/L (ref 20–32)
Calcium: 9.2 mg/dL (ref 8.6–10.3)
Chloride: 109 mmol/L (ref 98–110)
Creat: 1.4 mg/dL — ABNORMAL HIGH (ref 0.70–1.33)
Globulin: 2.8 g/dL (calc) (ref 1.9–3.7)
Glucose, Bld: 106 mg/dL — ABNORMAL HIGH (ref 65–99)
Potassium: 4.4 mmol/L (ref 3.5–5.3)
Sodium: 142 mmol/L (ref 135–146)
Total Bilirubin: 0.9 mg/dL (ref 0.2–1.2)
Total Protein: 6.9 g/dL (ref 6.1–8.1)

## 2020-09-03 LAB — HEMOGLOBIN A1C
Hgb A1c MFr Bld: 4.9 % of total Hgb (ref <5.7)
Mean Plasma Glucose: 94 mg/dL
eAG (mmol/L): 5.2 mmol/L

## 2020-09-03 LAB — TESTOSTERONE, FREE & TOTAL
Free Testosterone: 333.2 pg/mL — ABNORMAL HIGH (ref 35.0–155.0)
Testosterone, Total, LC-MS-MS: 1075 ng/dL (ref 250–1100)

## 2020-09-03 LAB — LIPID PANEL
Cholesterol: 139 mg/dL (ref <200)
HDL: 37 mg/dL — ABNORMAL LOW (ref >=40)
LDL Cholesterol (Calc): 85 mg/dL (calc)
Non-HDL Cholesterol (Calc): 102 mg/dL (calc) (ref <130)
Total CHOL/HDL Ratio: 3.8 (calc) (ref <5.0)
Triglycerides: 80 mg/dL (ref <150)

## 2020-09-03 LAB — TSH: TSH: 2.09 mIU/L (ref 0.40–4.50)

## 2020-09-03 LAB — PSA, TOTAL AND FREE
PSA, % Free: 17 % (calc) — ABNORMAL LOW (ref >25)
PSA, Free: 0.5 ng/mL
PSA, Total: 2.9 ng/mL (ref <=4.0)

## 2020-10-12 ENCOUNTER — Other Ambulatory Visit: Payer: Self-pay

## 2020-10-12 ENCOUNTER — Ambulatory Visit (INDEPENDENT_AMBULATORY_CARE_PROVIDER_SITE_OTHER): Payer: Managed Care, Other (non HMO) | Admitting: Sports Medicine

## 2020-10-12 DIAGNOSIS — N281 Cyst of kidney, acquired: Secondary | ICD-10-CM

## 2020-10-12 DIAGNOSIS — R109 Unspecified abdominal pain: Secondary | ICD-10-CM

## 2020-10-12 LAB — POCT URINALYSIS DIP (CLINITEK)
Bilirubin, UA: NEGATIVE
Blood, UA: NEGATIVE
Glucose, UA: NEGATIVE mg/dL
Ketones, POC UA: NEGATIVE mg/dL
Leukocytes, UA: NEGATIVE
Nitrite, UA: NEGATIVE
POC PROTEIN,UA: NEGATIVE
Spec Grav, UA: 1.015 (ref 1.010–1.025)
Urobilinogen, UA: 0.2 E.U./dL
pH, UA: 5.5 (ref 5.0–8.0)

## 2020-10-12 NOTE — Assessment & Plan Note (Addendum)
Jeremiah Webb has known renal cysts, very small, he is having some increasing left costovertebral/flank pain. This is worse when he stretches. I think this is likely musculoskeletal but he is very interested in getting his kidneys checked out again, happy to go ahead and do an ultrasound, urinalysis was negative today.  2 cysts again noted, septated with follow-up ultrasound needed in 1 year.

## 2020-10-12 NOTE — Progress Notes (Addendum)
    Procedures performed today:    None.  Independent interpretation of notes and tests performed by another provider:   None.  Brief History, Exam, Impression, and Recommendations:    Acquired cyst of kidney Jeremiah Webb has known renal cysts, very small, he is having some increasing left costovertebral/flank pain. This is worse when he stretches. I think this is likely musculoskeletal but he is very interested in getting his kidneys checked out again, happy to go ahead and do an ultrasound, urinalysis was negative today.  2 cysts again noted, septated with follow-up ultrasound needed in 1 year.    ___________________________________________ Jeremiah Webb. Benjamin Stain, M.D., ABFM., CAQSM. Primary Care and Sports Medicine [place] [facility]  Adjunct Instructor of Family Medicine  [employer] [facility] [employer]

## 2020-10-12 NOTE — Telephone Encounter (Signed)
Patient scheduled.

## 2020-10-13 ENCOUNTER — Other Ambulatory Visit: Payer: Managed Care, Other (non HMO)

## 2020-10-16 ENCOUNTER — Ambulatory Visit (INDEPENDENT_AMBULATORY_CARE_PROVIDER_SITE_OTHER): Payer: Managed Care, Other (non HMO)

## 2020-10-16 ENCOUNTER — Other Ambulatory Visit: Payer: Managed Care, Other (non HMO)

## 2020-10-16 ENCOUNTER — Other Ambulatory Visit: Payer: Self-pay

## 2020-10-16 DIAGNOSIS — N281 Cyst of kidney, acquired: Secondary | ICD-10-CM

## 2020-11-07 ENCOUNTER — Ambulatory Visit (INDEPENDENT_AMBULATORY_CARE_PROVIDER_SITE_OTHER): Payer: Managed Care, Other (non HMO) | Admitting: Sports Medicine

## 2020-11-07 ENCOUNTER — Other Ambulatory Visit: Payer: Self-pay

## 2020-11-07 DIAGNOSIS — Z0271 Encounter for disability determination: Secondary | ICD-10-CM

## 2020-11-07 NOTE — Progress Notes (Signed)
    Procedures performed today:    None.  Independent interpretation of notes and tests performed by another provider:   None.  Brief History, Exam, Impression, and Recommendations:    Disability examination We filled out additional disability paperwork today. Return as needed.    ___________________________________________ Ihor Austin. Benjamin Stain, M.D., ABFM., CAQSM. Primary Care and Sports Medicine [place] [facility]  Adjunct Instructor of Family Medicine  [employer]

## 2020-11-07 NOTE — Assessment & Plan Note (Signed)
We filled out additional disability paperwork today. Return as needed.

## 2020-11-29 ENCOUNTER — Other Ambulatory Visit: Payer: Self-pay

## 2020-11-29 ENCOUNTER — Encounter: Payer: Self-pay | Admitting: Sports Medicine

## 2020-11-29 ENCOUNTER — Ambulatory Visit (INDEPENDENT_AMBULATORY_CARE_PROVIDER_SITE_OTHER): Payer: Managed Care, Other (non HMO) | Admitting: Sports Medicine

## 2020-11-29 DIAGNOSIS — M940 Chondrocostal junction syndrome [Tietze]: Secondary | ICD-10-CM | POA: Diagnosis not present

## 2020-11-29 MED ORDER — NAPROXEN 500 MG PO TABS: 500.0000 mg | ORAL_TABLET | Freq: Two times a day (BID) | ORAL | 3 refills | Status: DC

## 2020-11-29 NOTE — Patient Instructions (Signed)
Costochondritis  Costochondritis is inflammation of the tissue (cartilage) that connects the ribs to the breastbone (sternum). This causes pain in the front of the chest. The pain usually starts slowly and involves more than one rib. What are the causes? The exact cause of this condition is not always known. It results from stress on the cartilage where your ribs attach to your sternum. The cause of this stress could be:  Chest injury.  Exercise or activity, such as lifting.  Severe coughing. What increases the risk? You are more likely to develop this condition if you:  Are male.  Are 30-40 years old.  Recently started a new exercise or work activity.  Have low levels of vitamin D.  Have a condition that makes you cough frequently. What are the signs or symptoms? The main symptom of this condition is chest pain. The pain:  Usually starts gradually and can be sharp or dull.  Gets worse with deep breathing, coughing, or exercise.  Gets better with rest.  May be worse when you press on the affected area of your ribs and sternum. How is this diagnosed? This condition is diagnosed based on your symptoms, your medical history, and a physical exam. Your health care provider will check for pain when pressing on your sternum. You may also have tests to rule out other causes of chest pain. These may include:  A chest X-ray to check for lung problems.  An ECG (electrocardiogram) to see if you have a heart problem that could be causing the pain.  An imaging scan to rule out a chest or rib fracture. How is this treated? This condition usually goes away on its own over time. Your health care provider may prescribe an NSAID, such as ibuprofen, to reduce pain and inflammation. Treatment may also include:  Resting and avoiding activities that make pain worse.  Applying heat or ice to the area to reduce pain and inflammation.  Doing exercises to stretch your chest muscles. If these  treatments do not help, your health care provider may inject a numbing medicine at the sternum-rib connection to help relieve the pain. Follow these instructions at home: Managing pain, stiffness, and swelling  If directed, put ice on the painful area. To do this: ? Put ice in a plastic bag. ? Place a towel between your skin and the bag. ? Leave the ice on for 20 minutes, 2-3 times a day.  If directed, apply heat to the affected area as often as told by your health care provider. Use the heat source that your health care provider recommends, such as a moist heat pack or a heating pad. ? Place a towel between your skin and the heat source. ? Leave the heat on for 20-30 minutes. ? Remove the heat if your skin turns bright red. This is especially important if you are unable to feel pain, heat, or cold. You may have a greater risk of getting burned.      Activity  Rest as told by your health care provider. Avoid activities that make pain worse. This includes any activities that use chest, abdominal, and side muscles.  Do not lift anything that is heavier than 10 lb (4.5 kg), or the limit that you are told, until your health care provider says that it is safe.  Return to your normal activities as told by your health care provider. Ask your health care provider what activities are safe for you. General instructions  Take over-the-counter and prescription medicines   only as told by your health care provider.  Keep all follow-up visits as told by your health care provider. This is important. Contact a health care provider if:  You have chills or a fever.  Your pain does not go away or it gets worse.  You have a cough that does not go away. Get help right away if:  You have shortness of breath.  You have severe chest pain that is not relieved by medicines, heat, or ice. These symptoms may represent a serious problem that is an emergency. Do not wait to see if the symptoms will go away.  Get medical help right away. Call your local emergency services (911 in the [place] Do not drive yourself to the hospital.  Summary  Costochondritis is inflammation of the tissue (cartilage) that connects the ribs to the breastbone (sternum).  This condition causes pain in the front of the chest.  Costochondritis results from stress on the cartilage where your ribs attach to your sternum.  Treatment may include medicines, rest, heat or ice, and exercises. This information is not intended to replace advice given to you by your health care provider. Make sure you discuss any questions you have with your health care provider. Document Revised: 06/11/2019 Document Reviewed: 06/11/2019 Elsevier Patient Education  2021 Elsevier Inc.  

## 2020-11-29 NOTE — Progress Notes (Signed)
    Procedures performed today:    None.  Independent interpretation of notes and tests performed by another provider:   None.  Brief History, Exam, Impression, and Recommendations:    Costochondritis This is a pleasant 60 year old male, for the past week or so he has noted pain along his right costal margin, worse with doing crunches and rows. No trauma, no change in activity prior. On exam he has tenderness directly at the right costal margin, no pain under the costal margin or in the epigastrium. This is entirely musculoskeletal, we discussed the pathophysiology, adding naproxen high-dose twice daily. I would like a chest x-ray, and we will print out some information, if its not better in about 2 to 4 weeks we will consider cross-sectional imaging.    ___________________________________________ Jeremiah Webb. Benjamin Stain, M.D., ABFM., CAQSM. Primary Care and Sports Medicine [place] [facility]  Adjunct Instructor of Family Medicine  [employer]

## 2020-11-29 NOTE — Assessment & Plan Note (Signed)
This is a pleasant 60 year old male, for the past week or so he has noted pain along his right costal margin, worse with doing crunches and rows. No trauma, no change in activity prior. On exam he has tenderness directly at the right costal margin, no pain under the costal margin or in the epigastrium. This is entirely musculoskeletal, we discussed the pathophysiology, adding naproxen high-dose twice daily. I would like a chest x-ray, and we will print out some information, if its not better in about 2 to 4 weeks we will consider cross-sectional imaging.

## 2020-12-27 ENCOUNTER — Telehealth: Payer: Self-pay | Admitting: Sports Medicine

## 2020-12-27 NOTE — Telephone Encounter (Signed)
Disability form signed today.

## 2021-03-09 ENCOUNTER — Other Ambulatory Visit: Payer: Self-pay

## 2021-03-09 ENCOUNTER — Encounter (HOSPITAL_COMMUNITY): Payer: Self-pay | Admitting: Emergency Medicine

## 2021-03-09 ENCOUNTER — Ambulatory Visit (HOSPITAL_COMMUNITY)
Admission: EM | Admit: 2021-03-09 | Discharge: 2021-03-09 | Disposition: A | Discharge status: Home / Self Care | Payer: Managed Care, Other (non HMO) | Attending: Student | Admitting: Student

## 2021-03-09 DIAGNOSIS — M10071 Idiopathic gout, right ankle and foot: Secondary | ICD-10-CM | POA: Diagnosis not present

## 2021-03-09 MED ORDER — PREDNISONE 50 MG PO TABS: 50.0000 mg | ORAL_TABLET | Freq: Every day | ORAL | 0 refills | Status: AC

## 2021-03-09 NOTE — Discharge Instructions (Addendum)
-  Prednisone, 1 pill taken with food for 5 days in a row.  Try taking this earlier in the day as it can give you energy. Avoid NSAIDs like ibuprofen and alleve while taking this medication as they can increase your risk of stomach upset and even GI bleeding when in combination with a steroid. You can continue tylenol (acetaminophen) up to 1000mg  3x daily. -Avoid red meat, seafood, beer/alcohol

## 2021-03-09 NOTE — ED Provider Notes (Signed)
[facility]    CSN: [id number] Arrival date & time: 03/09/21  1624      History   Chief Complaint Chief Complaint  Patient presents with   Gout    HPI Jeremiah Webb is a 60 y.o. male presenting with R ankle pain and swelling- states consistent with his typical gout symptoms. Medical history gout, hypothyroid, hypertension. Ate red meat and drank beer prior to onset. Denies recent injury or overuse. Advil providing some relief. Denies sensation changes.  HPI  Past Medical History:  Diagnosis Date   Benign essential hypertension 08/11/2018   Hypothyroidism    Thyroid disease    Hypothyroid    Patient Active Problem List   Diagnosis Date Noted   Costochondritis 11/29/2020   Disability examination 11/07/2020   Crushing injury of left thumb 08/31/2020   HNP (herniated nucleus pulposus), cervical 07/30/2019   Pes planus, congenital 10/19/2018   Abnormal weight loss 10/14/2018   Acute right ankle pain 09/16/2018   S/P TKR (total knee replacement), right  1/20 08/23/2018   Chronic renal insufficiency, stage III (moderate) 08/23/2018   Benign essential hypertension 08/11/2018   Preoperative evaluation of a medical condition to rule out surgical contraindications (TAR required) 06/10/2018   Upper airway cough syndrome 01/30/2018   Annual physical exam 12/22/2017   Acquired cyst of kidney 01/20/2017   Right shoulder pain 06/24/2016   Male hypogonadism 06/06/2016   Infrapatellar bursitis of right knee 12/19/2015   Crystalline arthropathy with gout and pseudogout 09/22/2015   Palpitations 02/14/2015   Degenerative disc disease, cervical 04/05/2014    Past Surgical History:  Procedure Laterality Date   CERVICAL DISC SURGERY     #5   POSTERIOR CERVICAL LAMINECTOMY WITH MET- RX Left 09/20/2014   Procedure: Left C7-T1 "Sitting" Microdiskectomy;  Surgeon: Kristeen Miss, MD;  Location: [facility];  Service: Neurosurgery;  Laterality: Left;  Left C7-T1 "Sitting"  Microdiskectomy   QUADRICEPS TENDON REPAIR     SHOULDER SURGERY         Home Medications    Prior to Admission medications   Medication Sig Start Date End Date Taking? Authorizing Provider  levothyroxine (SYNTHROID) 150 MCG tablet Take 1 tablet (150 mcg total) by mouth daily before breakfast. 09/01/20 03/09/21 Yes Silverio Decamp, MD  predniSONE (DELTASONE) 50 MG tablet Take 1 tablet (50 mg total) by mouth daily for 5 days. 03/09/21 03/14/21 Yes Hazel Sams, PA-C  naproxen (NAPROSYN) 500 MG tablet Take 1 tablet (500 mg total) by mouth 2 (two) times daily with a meal. 11/29/20 11/29/21  Silverio Decamp, MD  Needle, Disp, (HYPODERMIC NEEDLE 18GX1") 18G X 1" MISC Used to draw up testosterone 09/01/20   Silverio Decamp, MD  NEEDLE, DISP, 22 G (BD DISP NEEDLES) 22G X 1-1/2" MISC USE TO INJECT TESTOSTERONE 09/01/20   Silverio Decamp, MD  Syringe, Disposable, 3 ML MISC Use for testosterone injections 09/01/20   Silverio Decamp, MD  testosterone cypionate (DEPOTESTOSTERONE CYPIONATE) 200 MG/ML injection INJECT 0.6MLS (120 MG TOTAL) INTO THE MUSCLE EVERY 7 DAYS (SEVEN) DAYS. 09/01/20   Silverio Decamp, MD    Family History Family History  Problem Relation Age of Onset   Heart disease Mother    Stroke Mother    Diabetes Father    Colon cancer Maternal Grandfather    Esophageal cancer Neg Hx    Rectal cancer Neg Hx    Stomach cancer Neg Hx     Social History Social History  Tobacco Use   Smoking status: Never   Smokeless tobacco: Never  Substance Use Topics   Alcohol use: Yes    Alcohol/week: 0.0 standard drinks    Comment: occasional   Drug use: No     Allergies   Patient has no known allergies.   Review of Systems Review of Systems  Constitutional:  Negative for chills, fever and unexpected weight change.  Respiratory:  Negative for chest tightness and shortness of breath.   Cardiovascular:  Negative for chest pain and palpitations.   Gastrointestinal:  Negative for abdominal pain, diarrhea, nausea and vomiting.  Genitourinary:  Negative for decreased urine volume, difficulty urinating and frequency.  Musculoskeletal:  Negative for arthralgias, back pain, gait problem, joint swelling, myalgias, neck pain and neck stiffness.       R ankle pain  Skin:  Negative for wound.  Neurological:  Negative for dizziness, tremors, seizures, syncope, facial asymmetry, speech difficulty, weakness, light-headedness, numbness and headaches.  All other systems reviewed and are negative.   Physical Exam Triage Vital Signs ED Triage Vitals  Enc Vitals Group     BP 03/09/21 1728 (!) 144/81     Pulse Rate 03/09/21 1728 (!) 59     Resp 03/09/21 1728 18     Temp 03/09/21 1728 98 F (36.7 C)     Temp Source 03/09/21 1728 Oral     SpO2 03/09/21 1728 97 %     Weight --      Height --      Head Circumference --      Peak Flow --      Pain Score 03/09/21 1730 10     Pain Loc --      Pain Edu? --      Excl. in [place]? --    No data found.  Updated Vital Signs BP (!) 144/81 (BP Location: Left Arm)   Pulse (!) 59   Temp 98 F (36.7 C) (Oral)   Resp 18   SpO2 97%   Visual Acuity Right Eye Distance:   Left Eye Distance:   Bilateral Distance:    Right Eye Near:   Left Eye Near:    Bilateral Near:     Physical Exam Vitals reviewed.  Constitutional:      Appearance: Normal appearance.  HENT:     Head: Normocephalic and atraumatic.  Pulmonary:     Effort: Pulmonary effort is normal.  Musculoskeletal:     Comments: R ankle - tenderness and effusion. ROM intact but with pain. Sensation intact. DP 2+, cap refill <2 seconds. Ambulating with pain.  Skin:    Capillary Refill: Capillary refill takes less than 2 seconds.  Neurological:     General: No focal deficit present.     Mental Status: He is alert and oriented to person, place, and time.  Psychiatric:        Mood and Affect: Mood normal.        Behavior: Behavior normal.         Thought Content: Thought content normal.        Judgment: Judgment normal.     UC Treatments / Results  Labs (all labs ordered are listed, but only abnormal results are displayed) Labs Reviewed - No data to display  EKG   Radiology No results found.  Procedures Procedures (including critical care time)  Medications Ordered in UC Medications - No data to display  Initial Impression / Assessment and Plan / UC Course  I have reviewed  the triage vital signs and the nursing notes.  Pertinent labs & imaging results that were available during my care of the patient were reviewed by me and considered in my medical decision making (see chart for details).     This patient is a very pleasant 60 y.o. year old male presenting with gout R ankle due to eating red meat/beer. Neurovascularly intact. Prednisone. Rec low-purine diet. ED return precautions discussed. Patient verbalizes understanding and agreement.    Final Clinical Impressions(s) / UC Diagnoses   Final diagnoses:  Acute idiopathic gout of right ankle     Discharge Instructions      -Prednisone, 1 pill taken with food for 5 days in a row.  Try taking this earlier in the day as it can give you energy. Avoid NSAIDs like ibuprofen and alleve while taking this medication as they can increase your risk of stomach upset and even GI bleeding when in combination with a steroid. You can continue tylenol (acetaminophen) up to 1075m 3x daily. -Avoid red meat, seafood, beer/alcohol      ED Prescriptions     Medication Sig Dispense Auth. Provider   predniSONE (DELTASONE) 50 MG tablet Take 1 tablet (50 mg total) by mouth daily for 5 days. 5 tablet GHazel Sams PA-C      PDMP not reviewed this encounter.   GHazel Sams PA-C 03/09/21 [phone]

## 2021-03-09 NOTE — ED Triage Notes (Signed)
Right ankle pain and swelling. Denies injury. Ongoing x 1 week. Says his gout usually shows up in right ankle, and that prednisone usually helps.

## 2021-04-20 ENCOUNTER — Other Ambulatory Visit: Payer: Self-pay | Admitting: Sports Medicine

## 2021-04-20 DIAGNOSIS — E291 Testicular hypofunction: Secondary | ICD-10-CM

## 2021-04-23 ENCOUNTER — Ambulatory Visit: Payer: Managed Care, Other (non HMO) | Admitting: Sports Medicine

## 2021-05-17 ENCOUNTER — Telehealth (HOSPITAL_COMMUNITY): Payer: Self-pay | Admitting: *Deleted

## 2021-05-17 NOTE — Telephone Encounter (Signed)
Pt left VM requesting we schedule him a calcium scoring CT but patient has not been seen in office since 09/19/2017. Pt is considered a new pt and will need office visit before ordering CT. Called pt back to inform no answer/left vm.

## 2021-05-22 ENCOUNTER — Encounter (HOSPITAL_COMMUNITY): Payer: Managed Care, Other (non HMO) | Admitting: Cardiology

## 2021-06-08 ENCOUNTER — Other Ambulatory Visit: Payer: Self-pay | Admitting: Orthopedic Surgery

## 2021-06-08 DIAGNOSIS — M25511 Pain in right shoulder: Secondary | ICD-10-CM

## 2021-06-08 DIAGNOSIS — M25512 Pain in left shoulder: Secondary | ICD-10-CM

## 2021-06-25 ENCOUNTER — Ambulatory Visit
Admission: RE | Admit: 2021-06-25 | Discharge: 2021-06-25 | Disposition: A | Discharge status: Home / Self Care | Payer: Managed Care, Other (non HMO) | Source: Ambulatory Visit | Attending: Orthopedic Surgery | Admitting: Orthopedic Surgery

## 2021-06-25 DIAGNOSIS — M25512 Pain in left shoulder: Secondary | ICD-10-CM

## 2021-06-25 DIAGNOSIS — M25511 Pain in right shoulder: Secondary | ICD-10-CM

## 2021-07-01 ENCOUNTER — Other Ambulatory Visit: Payer: Self-pay | Admitting: Sports Medicine

## 2021-07-01 DIAGNOSIS — E291 Testicular hypofunction: Secondary | ICD-10-CM

## 2021-07-02 ENCOUNTER — Other Ambulatory Visit: Payer: Self-pay

## 2021-07-02 ENCOUNTER — Telehealth: Payer: Self-pay

## 2021-07-02 DIAGNOSIS — E291 Testicular hypofunction: Secondary | ICD-10-CM

## 2021-07-02 NOTE — Telephone Encounter (Signed)
Medication: testosterone cypionate (DEPOTESTOSTERONE CYPIONATE) 200 MG/ML injection Prior authorization submitted via CoverMyMeds on 07/02/2021 PA submission pending

## 2021-07-02 NOTE — Telephone Encounter (Signed)
Sent through a different encounter.

## 2021-07-20 ENCOUNTER — Telehealth (HOSPITAL_COMMUNITY): Payer: Self-pay | Admitting: Vascular Surgery

## 2021-07-20 ENCOUNTER — Encounter (HOSPITAL_COMMUNITY): Payer: Self-pay | Admitting: Cardiology

## 2021-07-20 ENCOUNTER — Ambulatory Visit (HOSPITAL_COMMUNITY)
Admission: RE | Admit: 2021-07-20 | Discharge: 2021-07-20 | Disposition: A | Discharge status: Home / Self Care | Payer: Managed Care, Other (non HMO) | Source: Ambulatory Visit | Attending: Cardiology | Admitting: Cardiology

## 2021-07-20 ENCOUNTER — Other Ambulatory Visit: Payer: Self-pay

## 2021-07-20 VITALS — BP 130/88 | HR 66 | Wt 218.0 lb

## 2021-07-20 DIAGNOSIS — R002 Palpitations: Secondary | ICD-10-CM | POA: Diagnosis present

## 2021-07-20 DIAGNOSIS — I428 Other cardiomyopathies: Secondary | ICD-10-CM | POA: Insufficient documentation

## 2021-07-20 DIAGNOSIS — Z7982 Long term (current) use of aspirin: Secondary | ICD-10-CM | POA: Insufficient documentation

## 2021-07-20 DIAGNOSIS — I493 Ventricular premature depolarization: Secondary | ICD-10-CM | POA: Insufficient documentation

## 2021-07-20 LAB — LIPID PANEL
Cholesterol: 128 mg/dL (ref 0–200)
HDL: 39 mg/dL — ABNORMAL LOW (ref >40)
LDL Cholesterol: 79 mg/dL (ref 0–99)
Total CHOL/HDL Ratio: 3.3 RATIO
Triglycerides: 50 mg/dL (ref <150)
VLDL: 10 mg/dL (ref 0–40)

## 2021-07-20 NOTE — Telephone Encounter (Signed)
Left pt message giving calcium score CT appt @ [facility] office 08/23/21 @ 8:30 AM

## 2021-07-20 NOTE — Patient Instructions (Signed)
Medication Changes:  No change  Lab Work:  Labs done today, your results will be available in MyChart, we will contact you for abnormal readings.   Testing/Procedures:  Coronary Calcium Score Cat Scan at [facility]  Echocardiogram at [facility]   Referrals:    Special Instructions // Education:    Follow-Up in: 2 years   At the [facility], you and your health needs are our priority. We have a designated team specialized in the treatment of Heart Failure. This Care Team includes your primary Heart Failure Specialized Cardiologist (physician), Advanced Practice Providers (APPs- Physician Assistants and Nurse Practitioners), and Pharmacist who all work together to provide you with the care you need, when you need it.   You may see any of the following providers on your designated Care Team at your next follow up:  Dr Arvilla Meres Dr Carron Curie, NP Robbie Lis, [place] [facility] [place], [place] Karle Plumber, PharmD   Please be sure to bring in all your medications bottles to every appointment.   Need to Contact us:  If you have any questions or concerns before your next appointment please send [place] a message through [place] or call our office at [phone].    TO LEAVE A MESSAGE FOR THE NURSE SELECT OPTION 2, PLEASE LEAVE A MESSAGE INCLUDING: YOUR NAME DATE OF BIRTH CALL BACK NUMBER REASON FOR CALL**this is important as we prioritize the call backs  YOU WILL RECEIVE A CALL BACK THE SAME DAY AS LONG AS YOU CALL BEFORE 4:00 PM

## 2021-07-21 NOTE — Progress Notes (Signed)
Patient ID: Jeremiah Webb, male   DOB: [birth year], 60 y.o.   MRN: [id number] PCP: Silverio Decamp, MD  Cardiology: Dr. Aundra Dubin  60 y.o. with history of PVCs returns for cardiology followup.  Patient has a long history of PVCs.  He had a workup back in 2008 for frequent PVCs.  At that time, he had a stress echo which was normal.  He has symptoms on and off.  When he is symptomatic, he feels skipped beats and palpitations.  Holter monitor in 1/19 showed occasional PVCs and PACs. Echo done in 8/16 showed EF 55-60% with increased echogenicity of septum.  Given PVCs and abnormal-appearing septum, cardiac MRI was done in 9/16.  This showed EF 66%, no LGE, mild noncompaction towards the apex.  Coronary artery calcium scan in 6/17 showed CAC 0 Agatston units.   Echo in 2/19 showed EF 55-60%, normal RV.  He returns today for followup of PVCs. He continues to feel occasional palpitations, but they really do not bother him.  He notices them more if he drinks ETOH.  They have not changed.  He does not have symptoms of sleep apnea, no snoring or daytime sleepiness. No exertional chest pain or dyspnea.  He exercises regularly.  SBP generally runs in the 120s when he checks at home.   ECG (personally reviewed): NSR, normal  Labs (7/16): K 4, creatinine 1.42, TSH 31, free T4 low, LDL 96 Labs (1/17): K 4.8, creatinine 1.39 Labs (1/22): LDL 85, TSH normal, K 4.4, creatinine 1.4  PMH: 1. Hypothyroidism 2. OSA: Not using CPAP 3. C-spine arthritis 4. H/o PVCs: Stress echo in 7/08 was normal.  Holter (6/30) with very rare PVCs. Echo (8/16) with EF 55-60%, increased echogenicity in septum.  Cardiac MRI was done because of abnormal septum and PVCs, showing EF 66%, no LGE, mild noncompaction towards the apex.  - Holter (1/19): Occasional PACs and PVCs. - Echo (2/19): EF 55-60%, normal RV.  5. CT for calcium score: CAC = 0 Agatston units. 6. Gout  SH: Games developer, nonsmoker, 2 kids, lives in  [place].  FH: Mother with CAD, MI around 58.   ROS: All systems reviewed and negative except as per HPI.   Current Outpatient Medications  Medication Sig Dispense Refill   levothyroxine (SYNTHROID) 150 MCG tablet Take 1 tablet (150 mcg total) by mouth daily before breakfast. 90 tablet 3   naproxen (NAPROSYN) 500 MG tablet Take 1 tablet (500 mg total) by mouth 2 (two) times daily with a meal. 60 tablet 3   Needle, Disp, (HYPODERMIC NEEDLE 18GX1") 18G X 1" MISC Used to draw up testosterone 25 each 11   NEEDLE, DISP, 22 G (BD DISP NEEDLES) 22G X 1-1/2" MISC USE TO INJECT TESTOSTERONE 10 each 0   Syringe, Disposable, 3 ML MISC Use for testosterone injections 25 each 11   testosterone cypionate (DEPOTESTOSTERONE CYPIONATE) 200 MG/ML injection INJECT 0.6ML INTO THE MUSCLE EVERY 7 DAYS 10 mL 0   No current facility-administered medications for this encounter.   BP 130/88   Pulse 66   Wt 98.9 kg (218 lb)   SpO2 96%   BMI 28.57 kg/m  General: NAD Neck: No JVD, no thyromegaly or thyroid nodule.  Lungs: Clear to auscultation bilaterally with normal respiratory effort. CV: Nondisplaced PMI.  Heart regular S1/S2, no S3/S4, no murmur.  No peripheral edema.  No carotid bruit.  Normal pedal pulses.  Abdomen: Soft, nontender, no hepatosplenomegaly, no distention.  Skin: Intact without lesions or rashes.  Neurologic: Alert and oriented x 3.  Psych: Normal affect. Extremities: No clubbing or cyanosis.  HEENT: Normal.   Assessment/Plan: 1. PVCs: Wax and wane, feels occasional palpitations.  Echo in 8/16 showed normal EF but increased echogenicity of septum.  Cardiac MRI was done, showing EF 66%, no late gadolinium enhancement, and possible mild noncompaction towards the apex.  With normal EF, not sure what to make of the possible noncompaction.  Repeat echo in 2/19 also showed EF 55-60%, normal. He occasional feels PVCs, no exertional symptoms. No PVCs on echo today.  - He is not particularly  symptomatic with PVCs and does not want to be treated with medications.  - With possible noncompaction on prior cMRI, I will arrange for repeat echo.  2. Cardiovascular risk assessment: Patient has family history of premature CAD (mother with MI around 62).  He does not have exertional symptoms.  No diabetes, HTN, or known hyperlipidemia.  He never smoked.  Coronary artery calcium score was 0 in 6/17.  - Continue ASA 81 daily.  - Check lipids today.  - It has been 5 years since last calcium score, I will repeat a calcium score this year for screening purposes.   If echo and calcium score are unremarkable, he can followup in 2-3 years.   Marca Ancona 07/21/2021

## 2021-08-09 ENCOUNTER — Encounter: Payer: Self-pay | Admitting: Sports Medicine

## 2021-08-23 ENCOUNTER — Other Ambulatory Visit: Payer: Managed Care, Other (non HMO)

## 2021-08-23 ENCOUNTER — Ambulatory Visit (INDEPENDENT_AMBULATORY_CARE_PROVIDER_SITE_OTHER): Payer: Managed Care, Other (non HMO) | Admitting: Sports Medicine

## 2021-08-23 ENCOUNTER — Other Ambulatory Visit: Payer: Self-pay

## 2021-08-23 DIAGNOSIS — G8929 Other chronic pain: Secondary | ICD-10-CM | POA: Diagnosis not present

## 2021-08-23 DIAGNOSIS — M25511 Pain in right shoulder: Secondary | ICD-10-CM

## 2021-08-23 NOTE — Assessment & Plan Note (Signed)
Filled out a disability form today, he can return as needed. Dr. Ave Filter is managing his shoulder.

## 2021-08-23 NOTE — Progress Notes (Signed)
° ° °  Procedures performed today:    None.  Independent interpretation of notes and tests performed by another provider:   None.  Brief History, Exam, Impression, and Recommendations:    Chronic bilateral shoulder pain with glenohumeral arthritis and rotator cuff tearing Filled out a disability form today, he can return as needed. Dr. Ave Filter is managing his shoulder.    ___________________________________________ Ihor Austin. Benjamin Stain, M.D., ABFM., CAQSM. Primary Care and Sports Medicine [facility]  Adjunct Instructor of Family Medicine  [employer]

## 2021-08-27 ENCOUNTER — Ambulatory Visit (HOSPITAL_COMMUNITY)
Admission: RE | Admit: 2021-08-27 | Discharge: 2021-08-27 | Disposition: A | Discharge status: Home / Self Care | Payer: Managed Care, Other (non HMO) | Source: Ambulatory Visit | Attending: Cardiology | Admitting: Cardiology

## 2021-08-27 ENCOUNTER — Other Ambulatory Visit: Payer: Self-pay

## 2021-08-27 ENCOUNTER — Telehealth (HOSPITAL_COMMUNITY): Payer: Self-pay

## 2021-08-27 DIAGNOSIS — I428 Other cardiomyopathies: Secondary | ICD-10-CM | POA: Diagnosis not present

## 2021-08-27 DIAGNOSIS — I10 Essential (primary) hypertension: Secondary | ICD-10-CM | POA: Diagnosis not present

## 2021-08-27 LAB — ECHOCARDIOGRAM COMPLETE
Area-P 1/2: 3.68 cm2
Calc EF: 61.5 %
S' Lateral: 3.3 cm
Single Plane A2C EF: 62.7 %
Single Plane A4C EF: 64 %

## 2021-08-27 NOTE — Progress Notes (Signed)
°  Echocardiogram 2D Echocardiogram has been performed.  Augustine Radar 08/27/2021, 8:54 AM

## 2021-08-27 NOTE — Telephone Encounter (Addendum)
Pt aware, agreeable, and verbalized understanding   ----- Message from Larey Dresser, MD sent at 08/27/2021  3:36 PM EST ----- Normal EF, stable

## 2021-08-30 ENCOUNTER — Other Ambulatory Visit (HOSPITAL_COMMUNITY): Payer: Managed Care, Other (non HMO)

## 2021-08-30 ENCOUNTER — Encounter: Payer: Self-pay | Admitting: Sports Medicine

## 2021-09-03 ENCOUNTER — Other Ambulatory Visit: Payer: Self-pay

## 2021-09-03 ENCOUNTER — Encounter: Payer: Self-pay | Admitting: Sports Medicine

## 2021-09-03 ENCOUNTER — Ambulatory Visit (INDEPENDENT_AMBULATORY_CARE_PROVIDER_SITE_OTHER): Payer: Managed Care, Other (non HMO) | Admitting: Sports Medicine

## 2021-09-03 ENCOUNTER — Ambulatory Visit (INDEPENDENT_AMBULATORY_CARE_PROVIDER_SITE_OTHER)
Admission: RE | Admit: 2021-09-03 | Discharge: 2021-09-03 | Disposition: A | Discharge status: Home / Self Care | Payer: Self-pay | Source: Ambulatory Visit | Attending: Cardiology | Admitting: Cardiology

## 2021-09-03 VITALS — BP 133/74 | HR 60 | Ht 73.25 in | Wt 211.0 lb

## 2021-09-03 DIAGNOSIS — E291 Testicular hypofunction: Secondary | ICD-10-CM

## 2021-09-03 DIAGNOSIS — M119 Crystal arthropathy, unspecified: Secondary | ICD-10-CM

## 2021-09-03 DIAGNOSIS — I10 Essential (primary) hypertension: Secondary | ICD-10-CM | POA: Diagnosis not present

## 2021-09-03 DIAGNOSIS — R739 Hyperglycemia, unspecified: Secondary | ICD-10-CM

## 2021-09-03 DIAGNOSIS — M6580 Other synovitis and tenosynovitis, unspecified site: Secondary | ICD-10-CM

## 2021-09-03 DIAGNOSIS — Z0000 Encounter for general adult medical examination without abnormal findings: Secondary | ICD-10-CM | POA: Diagnosis not present

## 2021-09-03 DIAGNOSIS — I428 Other cardiomyopathies: Secondary | ICD-10-CM

## 2021-09-03 NOTE — Assessment & Plan Note (Signed)
Jeremiah Webb has not noted a tremendous difference with being on testosterone so we will go ahead and discontinue it.

## 2021-09-03 NOTE — Progress Notes (Signed)
Subjective:    CC: Annual Physical Exam  HPI:  This patient is here for their annual physical  I reviewed the past medical history, family history, social history, surgical history, and allergies today and no changes were needed.  Please see the problem list section below in epic for further details.  Past Medical History: Past Medical History:  Diagnosis Date   Benign essential hypertension 08/11/2018   Hypothyroidism    Thyroid disease    Hypothyroid   Past Surgical History: Past Surgical History:  Procedure Laterality Date   CERVICAL DISC SURGERY     #5   POSTERIOR CERVICAL LAMINECTOMY WITH MET- RX Left 09/20/2014   Procedure: Left C7-T1 "Sitting" Microdiskectomy;  Surgeon: Kristeen Miss, MD;  Location: [place] [facility];  Service: Neurosurgery;  Laterality: Left;  Left C7-T1 "Sitting" Microdiskectomy   QUADRICEPS TENDON REPAIR     SHOULDER SURGERY     Social History: Social History   Socioeconomic History   Marital status: Single    Spouse name: Not on file   Number of children: Not on file   Years of education: Not on file   Highest education level: Not on file  Occupational History   Not on file  Tobacco Use   Smoking status: Never   Smokeless tobacco: Never  Substance and Sexual Activity   Alcohol use: Yes    Alcohol/week: 0.0 standard drinks    Comment: occasional   Drug use: No   Sexual activity: Never  Other Topics Concern   Not on file  Social History Narrative   Not on file   Social Determinants of Health   Financial Resource Strain: Not on file  Food Insecurity: Not on file  Transportation Needs: Not on file  Physical Activity: Not on file  Stress: Not on file  Social Connections: Not on file   Family History: Family History  Problem Relation Age of Onset   Heart disease Mother    Stroke Mother    Diabetes Father    Colon cancer Maternal Grandfather    Esophageal cancer Neg Hx    Rectal cancer Neg Hx    Stomach cancer Neg Hx     Allergies: No Known Allergies Medications: See med rec.  Review of Systems: No headache, visual changes, nausea, vomiting, diarrhea, constipation, dizziness, abdominal pain, skin rash, fevers, chills, night sweats, swollen lymph nodes, weight loss, chest pain, body aches, joint swelling, muscle aches, shortness of breath, mood changes, visual or auditory hallucinations.  Objective:    General: Well Developed, well nourished, and in no acute distress.  Neuro: Alert and oriented x3, extra-ocular muscles intact, sensation grossly intact. Cranial nerves II through XII are intact, motor, sensory, and coordinative functions are all intact. HEENT: Normocephalic, atraumatic, pupils equal round reactive to light, neck supple, no masses, no lymphadenopathy, thyroid nonpalpable. Oropharynx, nasopharynx, external ear canals are unremarkable. Skin: Warm and dry, no rashes noted.  Cardiac: Regular rate and rhythm, no murmurs rubs or gallops.  Respiratory: Clear to auscultation bilaterally. Not using accessory muscles, speaking in full sentences.  Abdominal: Soft, nontender, nondistended, positive bowel sounds, no masses, no organomegaly.  Musculoskeletal: Shoulder, elbow, wrist, hip, knee, ankle stable, and with full range of motion.  Impression and Recommendations:    The patient was counselled, risk factors were discussed, anticipatory guidance given.  Annual physical exam Unremarkable annual physical, checking routine labs. Declines vaccinations.  Male hypogonadism Ronalee Belts has not noted a tremendous difference with being on testosterone so we will go ahead and  discontinue it.   ___________________________________________ Gwen Her. Dianah Field, M.D., ABFM., CAQSM. Primary Care and Sports Medicine [facility]  Adjunct Professor of [employer]

## 2021-09-03 NOTE — Assessment & Plan Note (Signed)
Unremarkable annual physical, checking routine labs. Declines vaccinations.

## 2021-09-05 ENCOUNTER — Other Ambulatory Visit: Payer: Self-pay

## 2021-09-05 NOTE — Progress Notes (Signed)
Removed Naprosyn per patient request.

## 2021-09-06 ENCOUNTER — Encounter: Payer: Self-pay | Admitting: Sports Medicine

## 2021-09-06 DIAGNOSIS — M119 Crystal arthropathy, unspecified: Secondary | ICD-10-CM

## 2021-09-06 DIAGNOSIS — M6580 Other synovitis and tenosynovitis, unspecified site: Secondary | ICD-10-CM

## 2021-09-06 LAB — COMPREHENSIVE METABOLIC PANEL
AG Ratio: 1.5 (calc) (ref 1.0–2.5)
ALT: 14 U/L (ref 9–46)
AST: 18 U/L (ref 10–35)
Albumin: 4.2 g/dL (ref 3.6–5.1)
Alkaline phosphatase (APISO): 46 U/L (ref 35–144)
BUN/Creatinine Ratio: 11 (calc) (ref 6–22)
BUN: 18 mg/dL (ref 7–25)
CO2: 32 mmol/L (ref 20–32)
Calcium: 9.5 mg/dL (ref 8.6–10.3)
Chloride: 108 mmol/L (ref 98–110)
Creat: 1.67 mg/dL — ABNORMAL HIGH (ref 0.70–1.30)
Globulin: 2.8 g/dL (calc) (ref 1.9–3.7)
Glucose, Bld: 104 mg/dL — ABNORMAL HIGH (ref 65–99)
Potassium: 4.3 mmol/L (ref 3.5–5.3)
Sodium: 142 mmol/L (ref 135–146)
Total Bilirubin: 0.9 mg/dL (ref 0.2–1.2)
Total Protein: 7 g/dL (ref 6.1–8.1)

## 2021-09-06 LAB — TSH: TSH: 0.55 mIU/L (ref 0.40–4.50)

## 2021-09-06 LAB — CBC
HCT: 49.8 % (ref 38.5–50.0)
Hemoglobin: 16.3 g/dL (ref 13.2–17.1)
MCH: 29.2 pg (ref 27.0–33.0)
MCHC: 32.7 g/dL (ref 32.0–36.0)
MCV: 89.2 fL (ref 80.0–100.0)
MPV: 9.8 fL (ref 7.5–12.5)
Platelets: 298 10*3/uL (ref 140–400)
RBC: 5.58 10*6/uL (ref 4.20–5.80)
RDW: 11.5 % (ref 11.0–15.0)
WBC: 5 10*3/uL (ref 3.8–10.8)

## 2021-09-06 LAB — LIPID PANEL
Cholesterol: 124 mg/dL (ref <200)
HDL: 35 mg/dL — ABNORMAL LOW (ref >=40)
LDL Cholesterol (Calc): 73 mg/dL (calc)
Non-HDL Cholesterol (Calc): 89 mg/dL (calc) (ref <130)
Total CHOL/HDL Ratio: 3.5 (calc) (ref <5.0)
Triglycerides: 75 mg/dL (ref <150)

## 2021-09-06 LAB — HEMOGLOBIN A1C
Hgb A1c MFr Bld: 5 % of total Hgb (ref <5.7)
Mean Plasma Glucose: 97 mg/dL
eAG (mmol/L): 5.4 mmol/L

## 2021-09-06 LAB — URIC ACID: Uric Acid, Serum: 9.3 mg/dL — ABNORMAL HIGH (ref 4.0–8.0)

## 2021-09-06 LAB — PSA, TOTAL AND FREE
PSA, % Free: 33 % (calc) (ref >25)
PSA, Free: 0.9 ng/mL
PSA, Total: 2.7 ng/mL (ref <=4.0)

## 2021-09-06 LAB — TESTOSTERONE, FREE & TOTAL
Free Testosterone: 42.3 pg/mL (ref 35.0–155.0)
Testosterone, Total, LC-MS-MS: 252 ng/dL (ref 250–1100)

## 2021-09-06 MED ORDER — PREDNISONE 20 MG PO TABS
20.0000 mg | ORAL_TABLET | Freq: Every day | ORAL | 0 refills | Status: DC
Start: 2021-09-06 — End: 2021-10-22

## 2021-09-10 ENCOUNTER — Other Ambulatory Visit: Payer: Self-pay | Admitting: Sports Medicine

## 2021-09-10 DIAGNOSIS — E079 Disorder of thyroid, unspecified: Secondary | ICD-10-CM

## 2021-09-10 DIAGNOSIS — R7989 Other specified abnormal findings of blood chemistry: Secondary | ICD-10-CM

## 2021-10-13 ENCOUNTER — Encounter: Payer: Self-pay | Admitting: Sports Medicine

## 2021-10-13 DIAGNOSIS — N281 Cyst of kidney, acquired: Secondary | ICD-10-CM

## 2021-10-18 ENCOUNTER — Other Ambulatory Visit: Payer: Self-pay | Admitting: Urology

## 2021-10-18 ENCOUNTER — Encounter: Payer: Self-pay | Admitting: Sports Medicine

## 2021-10-18 DIAGNOSIS — N281 Cyst of kidney, acquired: Secondary | ICD-10-CM

## 2021-10-19 ENCOUNTER — Encounter: Payer: Self-pay | Admitting: Sports Medicine

## 2021-10-19 DIAGNOSIS — M119 Crystal arthropathy, unspecified: Secondary | ICD-10-CM

## 2021-10-22 MED ORDER — ALLOPURINOL 300 MG PO TABS: 300.0000 mg | ORAL_TABLET | Freq: Every day | ORAL | 6 refills | Status: DC

## 2021-10-22 NOTE — Assessment & Plan Note (Signed)
Finally agreeable to allopurinol, recheck uric acid levels in 1 month. ?

## 2021-11-06 ENCOUNTER — Ambulatory Visit
Admission: RE | Admit: 2021-11-06 | Discharge: 2021-11-06 | Disposition: A | Discharge status: Home / Self Care | Payer: Managed Care, Other (non HMO) | Source: Ambulatory Visit | Attending: Urology | Admitting: Urology

## 2021-11-06 ENCOUNTER — Other Ambulatory Visit: Payer: Self-pay

## 2021-11-06 DIAGNOSIS — N281 Cyst of kidney, acquired: Secondary | ICD-10-CM

## 2021-11-06 MED ORDER — GADOBENATE DIMEGLUMINE 529 MG/ML IV SOLN
20.0000 mL | Freq: Once | INTRAVENOUS | Status: AC | PRN
  Administered 2021-11-06: 20 mL via INTRAVENOUS

## 2021-11-09 ENCOUNTER — Other Ambulatory Visit: Payer: Self-pay | Admitting: Sports Medicine

## 2021-11-09 DIAGNOSIS — M119 Crystal arthropathy, unspecified: Secondary | ICD-10-CM

## 2021-11-16 ENCOUNTER — Encounter: Payer: Self-pay | Admitting: Sports Medicine

## 2021-12-25 ENCOUNTER — Encounter: Payer: Self-pay | Admitting: Sports Medicine

## 2021-12-25 DIAGNOSIS — E291 Testicular hypofunction: Secondary | ICD-10-CM

## 2022-02-05 ENCOUNTER — Encounter (INDEPENDENT_AMBULATORY_CARE_PROVIDER_SITE_OTHER): Payer: Self-pay | Admitting: Sports Medicine

## 2022-02-05 DIAGNOSIS — E291 Testicular hypofunction: Secondary | ICD-10-CM

## 2022-02-05 NOTE — Telephone Encounter (Signed)
I spent 5 total minutes of online digital evaluation and management services in this patient-initiated request for online care. 

## 2022-02-08 ENCOUNTER — Encounter (INDEPENDENT_AMBULATORY_CARE_PROVIDER_SITE_OTHER): Payer: Managed Care, Other (non HMO) | Admitting: Sports Medicine

## 2022-02-08 DIAGNOSIS — I10 Essential (primary) hypertension: Secondary | ICD-10-CM

## 2022-02-08 NOTE — Telephone Encounter (Signed)
I spent 5 total minutes of online digital evaluation and management services in this patient-initiated request for online care. 

## 2022-02-11 ENCOUNTER — Encounter: Payer: Self-pay | Admitting: Sports Medicine

## 2022-02-11 DIAGNOSIS — E291 Testicular hypofunction: Secondary | ICD-10-CM

## 2022-02-11 LAB — COMPREHENSIVE METABOLIC PANEL
AG Ratio: 1.4 (calc) (ref 1.0–2.5)
ALT: 32 U/L (ref 9–46)
AST: 26 U/L (ref 10–35)
Albumin: 4 g/dL (ref 3.6–5.1)
Alkaline phosphatase (APISO): 48 U/L (ref 35–144)
BUN/Creatinine Ratio: 12 (calc) (ref 6–22)
BUN: 18 mg/dL (ref 7–25)
CO2: 28 mmol/L (ref 20–32)
Calcium: 9.3 mg/dL (ref 8.6–10.3)
Chloride: 110 mmol/L (ref 98–110)
Creat: 1.49 mg/dL — ABNORMAL HIGH (ref 0.70–1.30)
Globulin: 2.9 g/dL (calc) (ref 1.9–3.7)
Glucose, Bld: 102 mg/dL — ABNORMAL HIGH (ref 65–99)
Potassium: 3.8 mmol/L (ref 3.5–5.3)
Sodium: 144 mmol/L (ref 135–146)
Total Bilirubin: 0.8 mg/dL (ref 0.2–1.2)
Total Protein: 6.9 g/dL (ref 6.1–8.1)

## 2022-02-11 LAB — TESTOSTERONE, FREE & TOTAL
Free Testosterone: 46.9 pg/mL (ref 35.0–155.0)
Testosterone, Total, LC-MS-MS: 258 ng/dL (ref 250–1100)

## 2022-02-11 LAB — URIC ACID: Uric Acid, Serum: 6.1 mg/dL (ref 4.0–8.0)

## 2022-02-14 MED ORDER — "NEEDLE (DISP) 22G X 1-1/2"" MISC": 11 refills | Status: DC

## 2022-02-14 MED ORDER — "SYRINGE 18G X 1-1/2"" 3 ML MISC": 11 refills | Status: DC

## 2022-02-14 MED ORDER — TESTOSTERONE CYPIONATE 200 MG/ML IM SOLN
200.0000 mg | INTRAMUSCULAR | 0 refills | Status: DC
Start: 2022-02-14 — End: 2023-01-29

## 2022-02-14 NOTE — Assessment & Plan Note (Signed)
Restarting testosterone injections, Jeremiah Webb is telling us now that he had good relief and improvement in symptoms the last time. We will start 200 mg every 2 weeks intramuscular, after 3 doses we will check his testosterone, PSA, CBC exactly 1 week after the third injection.

## 2022-02-21 ENCOUNTER — Encounter (INDEPENDENT_AMBULATORY_CARE_PROVIDER_SITE_OTHER): Payer: Self-pay | Admitting: Sports Medicine

## 2022-02-21 DIAGNOSIS — M119 Crystal arthropathy, unspecified: Secondary | ICD-10-CM

## 2022-02-21 DIAGNOSIS — M6580 Other synovitis and tenosynovitis, unspecified site: Secondary | ICD-10-CM

## 2022-02-22 MED ORDER — PREDNISONE 50 MG PO TABS: ORAL_TABLET | ORAL | 0 refills | Status: DC

## 2022-02-22 NOTE — Telephone Encounter (Signed)
I spent 5 total minutes of online digital evaluation and management services in this patient-initiated request for online care. 

## 2022-03-04 ENCOUNTER — Other Ambulatory Visit: Payer: Self-pay | Admitting: Sports Medicine

## 2022-03-04 DIAGNOSIS — R7989 Other specified abnormal findings of blood chemistry: Secondary | ICD-10-CM

## 2022-03-04 DIAGNOSIS — E079 Disorder of thyroid, unspecified: Secondary | ICD-10-CM

## 2022-04-08 ENCOUNTER — Encounter: Payer: Self-pay | Admitting: Sports Medicine

## 2022-04-21 LAB — TESTOSTERONE, FREE & TOTAL
Free Testosterone: 126.4 pg/mL (ref 35.0–155.0)
Testosterone, Total, LC-MS-MS: 661 ng/dL (ref 250–1100)

## 2022-05-22 ENCOUNTER — Encounter (INDEPENDENT_AMBULATORY_CARE_PROVIDER_SITE_OTHER): Payer: Self-pay | Admitting: Sports Medicine

## 2022-05-22 ENCOUNTER — Other Ambulatory Visit: Payer: Self-pay | Admitting: Sports Medicine

## 2022-05-22 DIAGNOSIS — M119 Crystal arthropathy, unspecified: Secondary | ICD-10-CM

## 2022-05-24 MED ORDER — PREDNISONE 50 MG PO TABS: ORAL_TABLET | ORAL | 0 refills | Status: DC

## 2022-05-24 NOTE — Assessment & Plan Note (Signed)
Having a flare currently, adding prednisone, return a month later, we will recheck uric acid levels and determine if allopurinol dose needs to be adjusted.

## 2022-05-24 NOTE — Telephone Encounter (Signed)
I spent 5 total minutes of online digital evaluation and management services in this patient-initiated request for online care. 

## 2022-05-29 ENCOUNTER — Other Ambulatory Visit: Payer: Self-pay | Admitting: Sports Medicine

## 2022-05-29 DIAGNOSIS — E079 Disorder of thyroid, unspecified: Secondary | ICD-10-CM

## 2022-05-29 DIAGNOSIS — R7989 Other specified abnormal findings of blood chemistry: Secondary | ICD-10-CM

## 2022-06-03 ENCOUNTER — Other Ambulatory Visit: Payer: Self-pay | Admitting: Sports Medicine

## 2022-06-03 DIAGNOSIS — M119 Crystal arthropathy, unspecified: Secondary | ICD-10-CM

## 2022-06-06 ENCOUNTER — Other Ambulatory Visit: Payer: Self-pay

## 2022-06-06 DIAGNOSIS — E079 Disorder of thyroid, unspecified: Secondary | ICD-10-CM

## 2022-06-06 DIAGNOSIS — R7989 Other specified abnormal findings of blood chemistry: Secondary | ICD-10-CM

## 2022-06-06 MED ORDER — LEVOTHYROXINE SODIUM 150 MCG PO TABS: 150.0000 ug | ORAL_TABLET | Freq: Every day | ORAL | 2 refills | Status: DC

## 2022-06-20 ENCOUNTER — Encounter: Payer: Self-pay | Admitting: Sports Medicine

## 2022-06-20 DIAGNOSIS — Z1211 Encounter for screening for malignant neoplasm of colon: Secondary | ICD-10-CM

## 2022-07-08 ENCOUNTER — Other Ambulatory Visit: Payer: Self-pay | Admitting: Sports Medicine

## 2022-07-08 DIAGNOSIS — M119 Crystal arthropathy, unspecified: Secondary | ICD-10-CM

## 2022-07-26 ENCOUNTER — Other Ambulatory Visit: Payer: Self-pay

## 2022-07-30 ENCOUNTER — Telehealth: Payer: Self-pay

## 2022-07-30 DIAGNOSIS — M119 Crystal arthropathy, unspecified: Secondary | ICD-10-CM

## 2022-07-30 MED ORDER — ALLOPURINOL 300 MG PO TABS: 300.0000 mg | ORAL_TABLET | Freq: Every day | ORAL | 3 refills | Status: DC

## 2022-07-30 NOTE — Telephone Encounter (Signed)
Now I do not think so, his levels were okay just a few months ago, I am going to just refill it.

## 2022-07-30 NOTE — Telephone Encounter (Signed)
Jeremiah Webb called and left a message stating he needs to have his uric acid checked to get refills. Does he need lab work?

## 2022-07-31 NOTE — Telephone Encounter (Signed)
Patient advised.

## 2022-09-09 ENCOUNTER — Ambulatory Visit: Payer: Managed Care, Other (non HMO) | Admitting: Sports Medicine

## 2022-09-13 ENCOUNTER — Encounter: Payer: Self-pay | Admitting: Sports Medicine

## 2022-09-13 ENCOUNTER — Ambulatory Visit (INDEPENDENT_AMBULATORY_CARE_PROVIDER_SITE_OTHER): Payer: Commercial Managed Care - PPO | Admitting: Sports Medicine

## 2022-09-13 VITALS — BP 134/87 | HR 89 | Ht 75.0 in | Wt 225.0 lb

## 2022-09-13 DIAGNOSIS — G8929 Other chronic pain: Secondary | ICD-10-CM

## 2022-09-13 DIAGNOSIS — Z0000 Encounter for general adult medical examination without abnormal findings: Secondary | ICD-10-CM | POA: Diagnosis not present

## 2022-09-13 DIAGNOSIS — R519 Headache, unspecified: Secondary | ICD-10-CM

## 2022-09-13 DIAGNOSIS — M119 Crystal arthropathy, unspecified: Secondary | ICD-10-CM

## 2022-09-13 DIAGNOSIS — M6580 Other synovitis and tenosynovitis, unspecified site: Secondary | ICD-10-CM

## 2022-09-13 DIAGNOSIS — I10 Essential (primary) hypertension: Secondary | ICD-10-CM | POA: Diagnosis not present

## 2022-09-13 DIAGNOSIS — E291 Testicular hypofunction: Secondary | ICD-10-CM

## 2022-09-13 NOTE — Assessment & Plan Note (Signed)
Annual physical as above. Up-to-date on screening measures. Return in 1 year for fasting annual physical, we will get routine labs today.

## 2022-09-13 NOTE — Progress Notes (Signed)
Subjective:    CC: Annual Physical Exam  HPI:  This patient is here for their annual physical  I reviewed the past medical history, family history, social history, surgical history, and allergies today and no changes were needed.  Please see the problem list section below in epic for further details.  Past Medical History: Past Medical History:  Diagnosis Date   Benign essential hypertension 08/11/2018   Hypothyroidism    Thyroid disease    Hypothyroid   Past Surgical History: Past Surgical History:  Procedure Laterality Date   CERVICAL DISC SURGERY     #5   POSTERIOR CERVICAL LAMINECTOMY WITH MET- RX Left 09/20/2014   Procedure: Left C7-T1 "Sitting" Microdiskectomy;  Surgeon: Kristeen Miss, MD;  Location: [place] [facility];  Service: Neurosurgery;  Laterality: Left;  Left C7-T1 "Sitting" Microdiskectomy   QUADRICEPS TENDON REPAIR     SHOULDER SURGERY     Social History: Social History   Socioeconomic History   Marital status: Single    Spouse name: Not on file   Number of children: Not on file   Years of education: Not on file   Highest education level: Not on file  Occupational History   Not on file  Tobacco Use   Smoking status: Never   Smokeless tobacco: Never  Substance and Sexual Activity   Alcohol use: Yes    Alcohol/week: 0.0 standard drinks of alcohol    Comment: occasional   Drug use: No   Sexual activity: Never  Other Topics Concern   Not on file  Social History Narrative   Not on file   Social Determinants of Health   Financial Resource Strain: Not on file  Food Insecurity: Not on file  Transportation Needs: Not on file  Physical Activity: Not on file  Stress: Not on file  Social Connections: Not on file   Family History: Family History  Problem Relation Age of Onset   Heart disease Mother    Stroke Mother    Diabetes Father    Colon cancer Maternal Grandfather    Esophageal cancer Neg Hx    Rectal cancer Neg Hx    Stomach cancer Neg Hx     Allergies: No Known Allergies Medications: See med rec.  Review of Systems: No headache, visual changes, nausea, vomiting, diarrhea, constipation, dizziness, abdominal pain, skin rash, fevers, chills, night sweats, swollen lymph nodes, weight loss, chest pain, body aches, joint swelling, muscle aches, shortness of breath, mood changes, visual or auditory hallucinations.  Objective:    General: Well Developed, well nourished, and in no acute distress.  Neuro: Alert and oriented x3, extra-ocular muscles intact, sensation grossly intact. Cranial nerves II through XII are intact, motor, sensory, and coordinative functions are all intact. HEENT: Normocephalic, atraumatic, pupils equal round reactive to light, neck supple, no masses, no lymphadenopathy, thyroid nonpalpable. Oropharynx, nasopharynx, external ear canals are unremarkable. Skin: Warm and dry, no rashes noted.  Cardiac: Regular rate and rhythm, no murmurs rubs or gallops.  Respiratory: Clear to auscultation bilaterally. Not using accessory muscles, speaking in full sentences.  Abdominal: Soft, nontender, nondistended, positive bowel sounds, no masses, no organomegaly.  Musculoskeletal: Shoulder, elbow, wrist, hip, knee, ankle stable, and with full range of motion.  Impression and Recommendations:    The patient was counselled, risk factors were discussed, anticipatory guidance given.  Annual physical exam Annual physical as above. Up-to-date on screening measures. Return in 1 year for fasting annual physical, we will get routine labs today.  Frequent headaches Pleasant  62 year old male, he had several years of headaches particularly at night and in the morning. He has tried multiple treatment modalities including analgesics without improvement. Due to persistent headaches we will proceed with brain MRI with and without contrast.  ____________________________________________ Gwen Her. Dianah Field, M.D., ABFM., CAQSM.,  AME. Primary Care and Sports Medicine  [facility]  Adjunct Professor of [employer]  Risk manager

## 2022-09-13 NOTE — Assessment & Plan Note (Signed)
Pleasant 62 year old male, he had several years of headaches particularly at night and in the morning. He has tried multiple treatment modalities including analgesics without improvement. Due to persistent headaches we will proceed with brain MRI with and without contrast.

## 2022-09-16 ENCOUNTER — Encounter (INDEPENDENT_AMBULATORY_CARE_PROVIDER_SITE_OTHER): Payer: Commercial Managed Care - PPO | Admitting: Sports Medicine

## 2022-09-16 DIAGNOSIS — N183 Chronic kidney disease, stage 3 unspecified: Secondary | ICD-10-CM | POA: Diagnosis not present

## 2022-09-16 DIAGNOSIS — N2889 Other specified disorders of kidney and ureter: Secondary | ICD-10-CM

## 2022-09-18 LAB — LIPID PANEL
Cholesterol: 127 mg/dL (ref <200)
HDL: 39 mg/dL — ABNORMAL LOW (ref >=40)
LDL Cholesterol (Calc): 73 mg/dL (calc)
Non-HDL Cholesterol (Calc): 88 mg/dL (calc) (ref <130)
Total CHOL/HDL Ratio: 3.3 (calc) (ref <5.0)
Triglycerides: 73 mg/dL (ref <150)

## 2022-09-18 LAB — COMPLETE METABOLIC PANEL WITH GFR
AG Ratio: 1.3 (calc) (ref 1.0–2.5)
ALT: 25 U/L (ref 9–46)
AST: 20 U/L (ref 10–35)
Albumin: 3.8 g/dL (ref 3.6–5.1)
Alkaline phosphatase (APISO): 46 U/L (ref 35–144)
BUN/Creatinine Ratio: 13 (calc) (ref 6–22)
BUN: 18 mg/dL (ref 7–25)
CO2: 27 mmol/L (ref 20–32)
Calcium: 9.3 mg/dL (ref 8.6–10.3)
Chloride: 109 mmol/L (ref 98–110)
Creat: 1.37 mg/dL — ABNORMAL HIGH (ref 0.70–1.30)
Globulin: 2.9 g/dL (calc) (ref 1.9–3.7)
Glucose, Bld: 105 mg/dL — ABNORMAL HIGH (ref 65–99)
Potassium: 4.1 mmol/L (ref 3.5–5.3)
Sodium: 144 mmol/L (ref 135–146)
Total Bilirubin: 0.5 mg/dL (ref 0.2–1.2)
Total Protein: 6.7 g/dL (ref 6.1–8.1)
eGFR: 59 mL/min/{1.73_m2} — ABNORMAL LOW (ref >=60)

## 2022-09-18 LAB — CBC
HCT: 42.2 % (ref 38.5–50.0)
Hemoglobin: 14.1 g/dL (ref 13.2–17.1)
MCH: 30 pg (ref 27.0–33.0)
MCHC: 33.4 g/dL (ref 32.0–36.0)
MCV: 89.8 fL (ref 80.0–100.0)
MPV: 9.7 fL (ref 7.5–12.5)
Platelets: 268 10*3/uL (ref 140–400)
RBC: 4.7 10*6/uL (ref 4.20–5.80)
RDW: 12 % (ref 11.0–15.0)
WBC: 4.8 10*3/uL (ref 3.8–10.8)

## 2022-09-18 LAB — HEMOGLOBIN A1C
Hgb A1c MFr Bld: 5.2 % of total Hgb (ref <5.7)
Mean Plasma Glucose: 103 mg/dL
eAG (mmol/L): 5.7 mmol/L

## 2022-09-18 LAB — TESTOSTERONE, FREE & TOTAL
Free Testosterone: 114.7 pg/mL (ref 35.0–155.0)
Testosterone, Total, LC-MS-MS: 515 ng/dL (ref 250–1100)

## 2022-09-18 LAB — PSA, TOTAL AND FREE
PSA, % Free: 24 % (calc) — ABNORMAL LOW (ref >25)
PSA, Free: 0.4 ng/mL
PSA, Total: 1.7 ng/mL (ref <=4.0)

## 2022-09-18 LAB — URIC ACID: Uric Acid, Serum: 4.6 mg/dL (ref 4.0–8.0)

## 2022-09-18 LAB — TSH: TSH: 0.51 mIU/L (ref 0.40–4.50)

## 2022-09-23 ENCOUNTER — Other Ambulatory Visit: Payer: Self-pay | Admitting: Sports Medicine

## 2022-09-23 DIAGNOSIS — E079 Disorder of thyroid, unspecified: Secondary | ICD-10-CM

## 2022-09-23 DIAGNOSIS — R7989 Other specified abnormal findings of blood chemistry: Secondary | ICD-10-CM

## 2022-09-27 NOTE — Telephone Encounter (Signed)
I spent 5 total minutes of online digital evaluation and management services in this patient-initiated request for online care. 

## 2022-09-30 ENCOUNTER — Ambulatory Visit (INDEPENDENT_AMBULATORY_CARE_PROVIDER_SITE_OTHER): Payer: Commercial Managed Care - PPO

## 2022-09-30 DIAGNOSIS — G8929 Other chronic pain: Secondary | ICD-10-CM | POA: Diagnosis not present

## 2022-09-30 DIAGNOSIS — R519 Headache, unspecified: Secondary | ICD-10-CM

## 2022-09-30 MED ORDER — GADOBUTROL 1 MMOL/ML IV SOLN
10.0000 mL | Freq: Once | INTRAVENOUS | Status: AC | PRN
  Administered 2022-09-30: 10 mL via INTRAVENOUS

## 2022-10-30 ENCOUNTER — Other Ambulatory Visit: Payer: Self-pay | Admitting: Sports Medicine

## 2022-10-30 DIAGNOSIS — E079 Disorder of thyroid, unspecified: Secondary | ICD-10-CM

## 2022-10-30 DIAGNOSIS — R7989 Other specified abnormal findings of blood chemistry: Secondary | ICD-10-CM

## 2022-11-25 ENCOUNTER — Other Ambulatory Visit: Payer: Self-pay | Admitting: Sports Medicine

## 2022-11-25 DIAGNOSIS — E079 Disorder of thyroid, unspecified: Secondary | ICD-10-CM

## 2022-11-25 DIAGNOSIS — R7989 Other specified abnormal findings of blood chemistry: Secondary | ICD-10-CM

## 2022-11-27 ENCOUNTER — Encounter: Payer: Self-pay | Admitting: Sports Medicine

## 2022-11-27 DIAGNOSIS — E079 Disorder of thyroid, unspecified: Secondary | ICD-10-CM

## 2022-11-27 DIAGNOSIS — R7989 Other specified abnormal findings of blood chemistry: Secondary | ICD-10-CM

## 2022-11-28 ENCOUNTER — Encounter: Payer: Self-pay | Admitting: Sports Medicine

## 2022-11-28 MED ORDER — LEVOTHYROXINE SODIUM 150 MCG PO TABS: 150.0000 ug | ORAL_TABLET | Freq: Every day | ORAL | 2 refills | Status: DC

## 2023-01-29 ENCOUNTER — Other Ambulatory Visit: Payer: Self-pay | Admitting: Sports Medicine

## 2023-01-29 DIAGNOSIS — E291 Testicular hypofunction: Secondary | ICD-10-CM

## 2023-06-26 LAB — HM COLONOSCOPY

## 2023-08-12 ENCOUNTER — Ambulatory Visit: Payer: Commercial Managed Care - PPO | Admitting: Sports Medicine

## 2023-08-15 ENCOUNTER — Ambulatory Visit: Payer: Commercial Managed Care - PPO | Admitting: Sports Medicine

## 2023-08-21 ENCOUNTER — Other Ambulatory Visit: Payer: Self-pay | Admitting: Sports Medicine

## 2023-08-21 ENCOUNTER — Ambulatory Visit (INDEPENDENT_AMBULATORY_CARE_PROVIDER_SITE_OTHER): Payer: 59 | Admitting: Sports Medicine

## 2023-08-21 ENCOUNTER — Encounter: Payer: Self-pay | Admitting: Sports Medicine

## 2023-08-21 VITALS — BP 143/81 | HR 88 | Ht 75.0 in | Wt 230.0 lb

## 2023-08-21 DIAGNOSIS — I10 Essential (primary) hypertension: Secondary | ICD-10-CM

## 2023-08-21 DIAGNOSIS — M6580 Other synovitis and tenosynovitis, unspecified site: Secondary | ICD-10-CM

## 2023-08-21 DIAGNOSIS — M119 Crystal arthropathy, unspecified: Secondary | ICD-10-CM | POA: Diagnosis not present

## 2023-08-21 DIAGNOSIS — Z0000 Encounter for general adult medical examination without abnormal findings: Secondary | ICD-10-CM

## 2023-08-21 DIAGNOSIS — R7989 Other specified abnormal findings of blood chemistry: Secondary | ICD-10-CM

## 2023-08-21 DIAGNOSIS — E291 Testicular hypofunction: Secondary | ICD-10-CM | POA: Diagnosis not present

## 2023-08-21 DIAGNOSIS — E038 Other specified hypothyroidism: Secondary | ICD-10-CM

## 2023-08-21 MED ORDER — TESTOSTERONE CYPIONATE 200 MG/ML IM SOLN: 200.0000 mg | INTRAMUSCULAR | 0 refills | Status: DC

## 2023-08-21 NOTE — Assessment & Plan Note (Signed)
 Annual physical as above, colonoscopy was done last year. Return to see me in a year, routine labs today.

## 2023-08-21 NOTE — Assessment & Plan Note (Signed)
 I had a long discussion with Jeremiah Webb about testosterone, its effects, prostate cancer. He does not have much improvement in his symptomatology so I have suggested that he discontinue testosterone, he plans to think about it.

## 2023-08-21 NOTE — Assessment & Plan Note (Signed)
 Elevated today, much better on recheck but still needs to check it at home over the next 2 weeks and send me numbers

## 2023-08-21 NOTE — Progress Notes (Addendum)
 Subjective:    CC: Annual Physical Exam  HPI:  This patient is here for their annual physical  I reviewed the past medical history, family history, social history, surgical history, and allergies today and no changes were needed.  Please see the problem list section below in epic for further details.  Past Medical History: Past Medical History:  Diagnosis Date   Benign essential hypertension 08/11/2018   Hypothyroidism    Thyroid  disease    Hypothyroid   Past Surgical History: Past Surgical History:  Procedure Laterality Date   CERVICAL DISC SURGERY     #5   POSTERIOR CERVICAL LAMINECTOMY WITH MET- RX Left 09/20/2014   Procedure: Left C7-T1 Sitting Microdiskectomy;  Surgeon: Victory Gens, MD;  Location: [facility];  Service: Neurosurgery;  Laterality: Left;  Left C7-T1 Sitting Microdiskectomy   QUADRICEPS TENDON REPAIR     SHOULDER SURGERY     Social History: Social History   Socioeconomic History   Marital status: Single    Spouse name: Not on file   Number of children: Not on file   Years of education: Not on file   Highest education level: Not on file  Occupational History   Not on file  Tobacco Use   Smoking status: Never   Smokeless tobacco: Never  Substance and Sexual Activity   Alcohol use: Yes    Alcohol/week: 0.0 standard drinks of alcohol    Comment: occasional   Drug use: No   Sexual activity: Never  Other Topics Concern   Not on file  Social History Narrative   Not on file   Social Drivers of Health   Financial Resource Strain: Not on file  Food Insecurity: Not on file  Transportation Needs: Not on file  Physical Activity: Not on file  Stress: Not on file  Social Connections: Not on file   Family History: Family History  Problem Relation Age of Onset   Heart disease Mother    Stroke Mother    Diabetes Father    Colon cancer Maternal Grandfather    Esophageal cancer Neg Hx    Rectal cancer Neg Hx    Stomach cancer Neg Hx     Allergies: No Known Allergies Medications: See med rec.  Review of Systems: No headache, visual changes, nausea, vomiting, diarrhea, constipation, dizziness, abdominal pain, skin rash, fevers, chills, night sweats, swollen lymph nodes, weight loss, chest pain, body aches, joint swelling, muscle aches, shortness of breath, mood changes, visual or auditory hallucinations.  Objective:    General: Well Developed, well nourished, and in no acute distress.  Neuro: Alert and oriented x3, extra-ocular muscles intact, sensation grossly intact. Cranial nerves II through XII are intact, motor, sensory, and coordinative functions are all intact. HEENT: Normocephalic, atraumatic, pupils equal round reactive to light, neck supple, no masses, no lymphadenopathy, thyroid  nonpalpable. Oropharynx, nasopharynx, external ear canals are unremarkable. Skin: Warm and dry, no rashes noted.  Cardiac: Regular rate and rhythm, no murmurs rubs or gallops.  Respiratory: Clear to auscultation bilaterally. Not using accessory muscles, speaking in full sentences.  Abdominal: Soft, nontender, nondistended, positive bowel sounds, no masses, no organomegaly.  Musculoskeletal: Shoulder, elbow, wrist, hip, knee, ankle stable, and with full range of motion.  Impression and Recommendations:    The patient was counselled, risk factors were discussed, anticipatory guidance given.  Annual physical exam Annual physical as above, colonoscopy was done last year. Return to see me in a year, routine labs today.  Male hypogonadism I had a long  discussion with Jeremiah Webb about testosterone , its effects, prostate cancer. He does not have much improvement in his symptomatology so I have suggested that he discontinue testosterone , he plans to think about it.  Benign essential hypertension Elevated today, much better on recheck but still needs to check it at home over the next 2 weeks and send me numbers  Subclinical  hypothyroidism TSH is again elevated, adding T3 and T4 levels to determine if are dealing with clinical or subclinical hypothyroidism.  TSH elevated, we will recheck this along with T3 and T4 and if persistently elevated we will go up on levothyroxine  dose.  I spent 40 minutes of total time managing this patient today, this includes chart review, face to face, and non-face to face time.  We did spend a great deal of time discussing the molecular biology of development of cancer, tumor suppressor genes and its relation to prostate cancer, colon cancer.  We also had significant discussions on back pain and renal anatomy.  All of the above was separate from the time spent performing the above annual physical examination.   ____________________________________________ Jeremiah Webb, M.D., ABFM., CAQSM., AME. Primary Care and Sports Medicine [place] [facility]  Adjunct Professor of [facility] Medicine  [employer]  Restaurant Manager, [employer]

## 2023-08-22 ENCOUNTER — Encounter: Payer: Self-pay | Admitting: Sports Medicine

## 2023-08-22 NOTE — Addendum Note (Signed)
 Addended by: Monica Becton on: 08/22/2023 05:00 PM   Modules accepted: Orders

## 2023-08-22 NOTE — Assessment & Plan Note (Addendum)
TSH is again elevated, adding T3 and T4 levels to determine if are dealing with clinical or subclinical hypothyroidism.  Update: Incidentally noted subclinical hypothyroidism with a normal T3 and T4, TSH over 10.  There is some data that treating subclinical hypothyroidism with a TSH over 10 (grade 2) and decrease overall cardiovascular mortality.  We will recheck the levels in approximately 2 to 3 months and reevaluate.  If persistently elevated TSH with normal T3 and T4 we will consider starting 25 mcg of levothyroxine.  Also adding thyroperoxidase antibodies to help with progression to true autoimmune/Hashimoto's hypothyroidism.

## 2023-08-25 ENCOUNTER — Other Ambulatory Visit: Payer: Self-pay

## 2023-08-25 DIAGNOSIS — E079 Disorder of thyroid, unspecified: Secondary | ICD-10-CM

## 2023-08-25 LAB — PSA, TOTAL AND FREE
PSA, Free Pct: 24 %
PSA, Free: 0.6 ng/mL
Prostate Specific Ag, Serum: 2.5 ng/mL (ref 0.0–4.0)

## 2023-08-25 LAB — LIPID PANEL
Chol/HDL Ratio: 3.7 ratio (ref 0.0–5.0)
Cholesterol, Total: 146 mg/dL (ref 100–199)
HDL: 40 mg/dL (ref >39)
LDL Chol Calc (NIH): 89 mg/dL (ref 0–99)
Triglycerides: 89 mg/dL (ref 0–149)
VLDL Cholesterol Cal: 17 mg/dL (ref 5–40)

## 2023-08-25 LAB — COMPREHENSIVE METABOLIC PANEL WITH GFR
Albumin: 4.1 g/dL (ref 3.8–4.9)
Alkaline Phosphatase: 56 IU/L (ref 44–121)
BUN/Creatinine Ratio: 12 (ref 10–24)
Bilirubin Total: 0.6 mg/dL (ref 0.0–1.2)
Chloride: 108 mmol/L — ABNORMAL HIGH (ref 96–106)
Creatinine, Ser: 1.62 mg/dL — ABNORMAL HIGH (ref 0.76–1.27)
Globulin, Total: 2.8 g/dL (ref 1.5–4.5)
Glucose: 109 mg/dL — ABNORMAL HIGH (ref 70–99)
Sodium: 143 mmol/L (ref 134–144)
Total Protein: 6.9 g/dL (ref 6.0–8.5)

## 2023-08-25 LAB — CBC
Hematocrit: 46.8 % (ref 37.5–51.0)
Hemoglobin: 15.3 g/dL (ref 13.0–17.7)
MCH: 29.6 pg (ref 26.6–33.0)
MCHC: 32.7 g/dL (ref 31.5–35.7)
MCV: 91 fL (ref 79–97)
Platelets: 293 10*3/uL (ref 150–450)
RBC: 5.17 x10E6/uL (ref 4.14–5.80)
RDW: 11.6 % (ref 11.6–15.4)
WBC: 4.9 x10E3/uL (ref 3.4–10.8)

## 2023-08-25 LAB — HEMOGLOBIN A1C
Est. average glucose Bld gHb Est-mCnc: 103 mg/dL
Hgb A1c MFr Bld: 5.2 % (ref 4.8–5.6)

## 2023-08-25 LAB — COMPREHENSIVE METABOLIC PANEL
ALT: 25 [IU]/L (ref 0–44)
AST: 25 [IU]/L (ref 0–40)
BUN: 19 mg/dL (ref 8–27)
CO2: 21 mmol/L (ref 20–29)
Calcium: 9.1 mg/dL (ref 8.6–10.2)
Potassium: 4.2 mmol/L (ref 3.5–5.2)
eGFR: 48 mL/min/{1.73_m2} — ABNORMAL LOW (ref >59)

## 2023-08-25 LAB — TSH: TSH: 10.4 u[IU]/mL — ABNORMAL HIGH (ref 0.450–4.500)

## 2023-08-25 LAB — TESTOSTERONE, FREE, TOTAL, SHBG
Sex Hormone Binding: 21.9 nmol/L (ref 19.3–76.4)
Testosterone, Free: 6.4 pg/mL — ABNORMAL LOW (ref 6.6–18.1)
Testosterone: 311 ng/dL (ref 264–916)

## 2023-08-25 LAB — URIC ACID: Uric Acid: 7.6 mg/dL (ref 3.8–8.4)

## 2023-08-25 NOTE — Progress Notes (Signed)
 Labs

## 2023-09-06 NOTE — Addendum Note (Signed)
Addended by: Monica Becton on: 09/06/2023 06:16 AM   Modules accepted: Orders

## 2023-09-16 ENCOUNTER — Encounter: Payer: Commercial Managed Care - PPO | Admitting: Sports Medicine

## 2023-09-16 LAB — T3, FREE: T3, Free: 2.5 pg/mL (ref 2.0–4.4)

## 2023-09-16 LAB — T4, FREE: Free T4: 1.07 ng/dL (ref 0.82–1.77)

## 2023-09-16 LAB — SPECIMEN STATUS REPORT

## 2023-10-08 ENCOUNTER — Telehealth: Payer: Self-pay

## 2023-10-08 DIAGNOSIS — E079 Disorder of thyroid, unspecified: Secondary | ICD-10-CM

## 2023-10-08 DIAGNOSIS — R7989 Other specified abnormal findings of blood chemistry: Secondary | ICD-10-CM

## 2023-10-08 NOTE — Telephone Encounter (Signed)
 Pharmacy has notified us that patients LEVOTHYROXINE NDC number has changed from [phone] to [phone].

## 2023-10-21 ENCOUNTER — Other Ambulatory Visit: Payer: Self-pay | Admitting: Sports Medicine

## 2023-10-21 DIAGNOSIS — M119 Crystal arthropathy, unspecified: Secondary | ICD-10-CM

## 2023-10-30 ENCOUNTER — Encounter (INDEPENDENT_AMBULATORY_CARE_PROVIDER_SITE_OTHER): Payer: Self-pay | Admitting: Sports Medicine

## 2023-10-30 DIAGNOSIS — I10 Essential (primary) hypertension: Secondary | ICD-10-CM | POA: Diagnosis not present

## 2023-10-30 NOTE — Telephone Encounter (Signed)

## 2023-12-04 ENCOUNTER — Other Ambulatory Visit: Payer: Self-pay | Admitting: Sports Medicine

## 2023-12-04 ENCOUNTER — Other Ambulatory Visit: Payer: Self-pay

## 2023-12-04 DIAGNOSIS — E079 Disorder of thyroid, unspecified: Secondary | ICD-10-CM

## 2023-12-04 DIAGNOSIS — R7989 Other specified abnormal findings of blood chemistry: Secondary | ICD-10-CM

## 2023-12-29 ENCOUNTER — Other Ambulatory Visit: Payer: Self-pay | Admitting: Sports Medicine

## 2023-12-29 DIAGNOSIS — R7989 Other specified abnormal findings of blood chemistry: Secondary | ICD-10-CM

## 2023-12-29 DIAGNOSIS — E079 Disorder of thyroid, unspecified: Secondary | ICD-10-CM

## 2024-01-15 ENCOUNTER — Telehealth (HOSPITAL_COMMUNITY): Payer: Self-pay

## 2024-01-15 NOTE — Telephone Encounter (Signed)
 Called to confirm/remind patient of their appointment at the [facility] on 01/16/24.   Appointment:   [x] Confirmed  [] Left mess   [] No answer/No voice mail  [] VM Full/unable to leave message  [] Phone not in service  Patient reminded to bring all medications and/or complete list.  Confirmed patient has transportation. Gave directions, instructed to utilize valet parking.

## 2024-01-16 ENCOUNTER — Ambulatory Visit (HOSPITAL_COMMUNITY): Payer: Self-pay | Admitting: Family Medicine

## 2024-01-16 ENCOUNTER — Ambulatory Visit (HOSPITAL_COMMUNITY)
Admission: RE | Admit: 2024-01-16 | Discharge: 2024-01-16 | Disposition: A | Discharge status: Home / Self Care | Source: Ambulatory Visit | Attending: Family Medicine | Admitting: Family Medicine

## 2024-01-16 ENCOUNTER — Encounter (HOSPITAL_COMMUNITY): Payer: Self-pay

## 2024-01-16 VITALS — BP 144/94 | HR 61 | Ht 75.0 in | Wt 214.2 lb

## 2024-01-16 DIAGNOSIS — Z79899 Other long term (current) drug therapy: Secondary | ICD-10-CM | POA: Insufficient documentation

## 2024-01-16 DIAGNOSIS — I10 Essential (primary) hypertension: Secondary | ICD-10-CM

## 2024-01-16 DIAGNOSIS — R079 Chest pain, unspecified: Secondary | ICD-10-CM | POA: Diagnosis not present

## 2024-01-16 DIAGNOSIS — R002 Palpitations: Secondary | ICD-10-CM | POA: Diagnosis not present

## 2024-01-16 DIAGNOSIS — R0789 Other chest pain: Secondary | ICD-10-CM | POA: Diagnosis not present

## 2024-01-16 DIAGNOSIS — I493 Ventricular premature depolarization: Secondary | ICD-10-CM | POA: Diagnosis not present

## 2024-01-16 DIAGNOSIS — N1831 Chronic kidney disease, stage 3a: Secondary | ICD-10-CM | POA: Diagnosis not present

## 2024-01-16 DIAGNOSIS — I129 Hypertensive chronic kidney disease with stage 1 through stage 4 chronic kidney disease, or unspecified chronic kidney disease: Secondary | ICD-10-CM | POA: Insufficient documentation

## 2024-01-16 LAB — BASIC METABOLIC PANEL WITH GFR
Anion gap: 6 (ref 5–15)
BUN: 16 mg/dL (ref 8–23)
CO2: 27 mmol/L (ref 22–32)
Calcium: 9.2 mg/dL (ref 8.9–10.3)
Chloride: 109 mmol/L (ref 98–111)
Creatinine, Ser: 1.4 mg/dL — ABNORMAL HIGH (ref 0.61–1.24)
GFR, Estimated: 57 mL/min — ABNORMAL LOW (ref >60)
Glucose, Bld: 111 mg/dL — ABNORMAL HIGH (ref 70–99)
Potassium: 4.6 mmol/L (ref 3.5–5.1)
Sodium: 142 mmol/L (ref 135–145)

## 2024-01-16 MED ORDER — LOSARTAN POTASSIUM 25 MG PO TABS: 12.5000 mg | ORAL_TABLET | Freq: Every day | ORAL | 11 refills | Status: AC

## 2024-01-16 NOTE — Patient Instructions (Addendum)
 Great to see you today!!!  Medication Changes:  START Losartan 12.5 mg (1/2 tab) Daily  Lab Work:  Labs done today, your results will be available in MyChart, we will contact you for abnormal readings.  Your physician recommends that you return for lab work in: 2 weeks  Testing/Procedures:  Your physician has requested that you have an echocardiogram. Echocardiography is a painless test that uses sound waves to create images of your heart. It provides your doctor with information about the size and shape of your heart and how well your heart's chambers and valves are working. This procedure takes approximately one hour. There are no restrictions for this procedure. Please do NOT wear cologne, perfume, aftershave, or lotions (deodorant is allowed). Please arrive 15 minutes prior to your appointment time.  Please note: We ask at that you not bring children with you during ultrasound (echo/ vascular) testing. Due to room size and safety concerns, children are not allowed in the ultrasound rooms during exams. Our front office staff cannot provide observation of children in our lobby area while testing is being conducted. An adult accompanying a patient to their appointment will only be allowed in the ultrasound room at the discretion of the ultrasound technician under special circumstances. We apologize for any inconvenience.   Your physician has requested that you have an exercise tolerance test, please follow instructions below You will be called to schedule this Exercise Stress Test  An exercise stress test is a test to check how your heart works during exercise and checks for coronary artery disease. Your heart rate will be watched while you are resting and while you are exercising.  Patches (electrodes) will be put on your chest. Do not put lotions, powders, creams, or oils on your chest before the test. Wires will be connected to the patches. The wires will send signals to a machine to  record your heartbeat. Wear comfortable shoes and clothing. Follow instructions from your doctor about what you cannot eat or drink before the test.  Before the procedure Follow instructions from your doctor about what you cannot eat or drink. Do not have any drinks or foods that have caffeine in them for 24 hours before the test, or as told by your doctor. This includes coffee, tea (even decaf tea), sodas, chocolate, and cocoa. Hold beta-blocker medicines for night before and the morning of. Take all other BP meds that aren't Bbs.  If you use an inhaler, bring it with you to the test. Do not use any products that have nicotine or tobacco in them, such as cigarettes and e-cigarettes. Stop using them at least 4 hours before the test. If you need help quitting, ask your doctor.   Follow-Up in: 1 year with Dr Mitzie Anda (June 2026), *PLEASE CALL OUR OFFICE IN [place] TO SCHEDULE THIS APPOINTMENT   At the [facility], you and your health needs are our priority. We have a designated team specialized in the treatment of Heart Failure. This Care Team includes your primary Heart Failure Specialized Cardiologist (physician), Advanced Practice Providers (APPs- Physician Assistants and Nurse Practitioners), and Pharmacist who all work together to provide you with the care you need, when you need it.   You may see any of the following providers on your designated Care Team at your next follow up:  Dr. Jules Oar Dr. Peder Bourdon Dr. Alwin Baars Dr. Judyth Nunnery Nieves Bars, NP Ruddy Corral, PA [facility] [place], [place] Dennise Fitz, NP [place] Lee, NP  Luster Salters, PharmD   Please be sure to bring in all your medications bottles to every appointment.   Need to Contact Us :  If you have any questions or concerns before your next appointment please send us  a message through [place] or call our office at [phone].    TO LEAVE A MESSAGE FOR THE NURSE SELECT  OPTION 2, PLEASE LEAVE A MESSAGE INCLUDING: YOUR NAME DATE OF BIRTH CALL BACK NUMBER REASON FOR CALL**this is important as we prioritize the call backs  YOU WILL RECEIVE A CALL BACK THE SAME DAY AS LONG AS YOU CALL BEFORE 4:00 PM

## 2024-01-16 NOTE — Progress Notes (Signed)
 Patient ID: Jeremiah Webb, male   DOB: Sep 29, 1960, 63 y.o.   MRN: [id number] PCP: Gean Keels, MD  Cardiology: Dr. Mitzie Anda  63 y.o. with history of PVCs returns for cardiology followup.  Patient has a long history of PVCs.  He had a workup back in 2008 for frequent PVCs.  At that time, he had a stress echo which was normal.  He has symptoms on and off.  When he is symptomatic, he feels skipped beats and palpitations.  Holter monitor in 1/19 showed occasional PVCs and PACs. Echo done in 8/16 showed EF 55-60% with increased echogenicity of septum.  Given PVCs and abnormal-appearing septum, cardiac MRI was done in 9/16.  This showed EF 66%, no LGE, mild noncompaction towards the apex.  Coronary artery calcium scan in 6/17 showed CAC 0 Agatston units.   Echo in 2/19 showed EF 55-60%, normal RV.  Echo 1/23 EF 55-60%, normal RV. Cardiac CT 1/23: calcium score 0, low risk.   Today he returns for cardiac follow up. Has rare atypical CP, most recent episode 2 weeks ago, occurred while sitting down, resolved spontaneously. Lasted several minutes. No SOB with activity, stays active working out 4 days/week. Non smoker, BP at home 123/80s-135/97, rare ETOH. Overall feeling fine. Feels occasional palpitations, he associates this with PVCs. He is unsure if he snores, not on CPAP. Denies abnormal bleeding, dizziness, edema, or PND/Orthopnea. Weight at home 215-230 pounds. PCP follows labs. Brother passed 2 years ago, was on HD, mother passed from CAD in her 64s. He is worried about his CV health with his family history.  ECG (personally reviewed): NSR 61 bpm  Labs (7/16): K 4, creatinine 1.42, TSH 31, free T4 low, LDL 96 Labs (1/17): K 4.8, creatinine 1.39 Labs (1/22): LDL 85, TSH normal, K 4.4, creatinine 1.4 Labs (1/25): K 4.2, creatinine 1.62, LDL 89  PMH: 1. Hypothyroidism 2. OSA: Not using CPAP 3. C-spine arthritis 4. H/o PVCs: Stress echo in 7/08 was normal.  Holter (6/30) with very rare PVCs.  Echo (8/16) with EF 55-60%, increased echogenicity in septum.  Cardiac MRI was done because of abnormal septum and PVCs, showing EF 66%, no LGE, mild noncompaction towards the apex.  - Holter (1/19): Occasional PACs and PVCs. - Echo (2/19): EF 55-60%, normal RV.  - Echo 1/23: EF 55-60%, normal RV 5. CT for calcium score: CAC = 0 Agatston units. 6. Gout  SH: Airline pilot, nonsmoker, 2 kids, lives in [place].  FH: Mother with CAD, MI around 61.   ROS: All systems reviewed and negative except as per HPI.   Current Outpatient Medications  Medication Sig Dispense Refill   levothyroxine  (SYNTHROID ) 150 MCG tablet TAKE 1 TABLET BY MOUTH ONCE DAILY BEFORE BREAKFAST 90 tablet 1   No current facility-administered medications for this encounter.   Wt Readings from Last 3 Encounters:  01/16/24 97.2 kg (214 lb 3.2 oz)  08/21/23 104.3 kg (230 lb)  09/13/22 102.1 kg (225 lb)   BP (!) 144/94   Pulse 61   Ht 6\' 3"  (1.905 m)   Wt 97.2 kg (214 lb 3.2 oz)   SpO2 98%   BMI 26.77 kg/m   Physical Exam General:  NAD. No resp difficulty, walked into clinic HEENT: Normal Neck: Supple. No JVD. Cor: Regular rate & rhythm. No rubs, gallops or murmurs. Lungs: Clear Abdomen: Soft, nontender, nondistended.  Extremities: No cyanosis, clubbing, rash, edema Neuro: Alert & oriented x 3, moves all 4 extremities w/o difficulty. Affect pleasant.  Assessment/Plan: 1. PVCs: Wax and wane, feels occasional palpitations.  Echo in 8/16 showed normal EF but increased echogenicity of septum.  Cardiac MRI was done, showing EF 66%, no late gadolinium enhancement, and possible mild noncompaction towards the apex.  With normal EF, not sure what to make of the possible noncompaction.  Repeat echo in 2/19 also showed EF 55-60%, normal. Echo 1/23: EF 55-60%, normal RV.  - Update echo (see #2) - He is not particularly symptomatic with PVCs and does not want to be treated with medications.  - None on ECG today. -  Consider home sleep study. 2. Cardiovascular risk assessment: Patient has family history of premature CAD (mother with MI around 67).  He does not have exertional symptoms.  No diabetes, or known hyperlipidemia.  He never smoked.  Coronary artery calcium score was 0 in 6/17 & 0 in 08/2021. BP has been creeping up. He has rare atypical CP, low suspicion for obs CAD. ECG today normal without acute ST-T changes. - LDL 89 (1/25) - He has rare atypical CP. Arrange exercise stress test & update echo.  3. HTN: BP creeping up on home checks. Brother was on dialysis, he thinks due to uncontrolled HTN. - Start losartan 12.5 mg daily. BMET today, repeat in 2 weeks. - Continue to check BP at home and log. - Consider home sleep study. 4. CKD 3a: non-diabetic. Baseline SCr 1.4-1.6. - Starting losartan as above. Follow BMET closely. - He is asking for nephrology referral, I advised further discussion with his PCP first. Likely needs UACR  Follow up in 1 year with Dr. Mitzie Anda. Consdier graduation from [facility] clinic and referral to General Cardiology.  Arlice Bene [facility] FNP-BC 01/16/2024

## 2024-02-03 ENCOUNTER — Other Ambulatory Visit (HOSPITAL_COMMUNITY)

## 2024-02-06 ENCOUNTER — Encounter: Payer: Self-pay | Admitting: Sports Medicine

## 2024-02-06 DIAGNOSIS — N2889 Other specified disorders of kidney and ureter: Secondary | ICD-10-CM

## 2024-02-06 DIAGNOSIS — N183 Chronic kidney disease, stage 3 unspecified: Secondary | ICD-10-CM

## 2024-02-23 ENCOUNTER — Encounter: Payer: Self-pay | Admitting: Sports Medicine

## 2024-02-23 ENCOUNTER — Ambulatory Visit (INDEPENDENT_AMBULATORY_CARE_PROVIDER_SITE_OTHER): Admitting: Sports Medicine

## 2024-02-23 VITALS — BP 129/77 | HR 52 | Resp 20 | Ht 75.0 in | Wt 205.0 lb

## 2024-02-23 DIAGNOSIS — E079 Disorder of thyroid, unspecified: Secondary | ICD-10-CM | POA: Diagnosis not present

## 2024-02-23 DIAGNOSIS — N2889 Other specified disorders of kidney and ureter: Secondary | ICD-10-CM

## 2024-02-23 DIAGNOSIS — E038 Other specified hypothyroidism: Secondary | ICD-10-CM | POA: Diagnosis not present

## 2024-02-23 DIAGNOSIS — N183 Chronic kidney disease, stage 3 unspecified: Secondary | ICD-10-CM

## 2024-02-23 DIAGNOSIS — R7989 Other specified abnormal findings of blood chemistry: Secondary | ICD-10-CM

## 2024-02-23 NOTE — Assessment & Plan Note (Addendum)
 Jeremiah Webb comes in with a lot of questions, he does have some chronic renal insufficiency, though his renal function has been stable over several years. He has a brother that had renal failure, and was on dialysis. He is very worried about his own renal function and potential progression. Most recent GFR was 57 but has been fairly stable over the past few years. He has never had hyperkalemia, his blood pressure has remained normal over the years. He does not have any swelling. His cardiologist did put him on losartan  which I think is highly appropriate. We also discussed SGLT2 inhibitors such as Jardiance. He requested a nephrology referral as well. We will check a microalbumin creatinine ratio. We advised avoidance of nephrotoxic medications such as excessive use of NSAIDs, aggressive hydration. He does have a desire to get some labs checked today, I am happy to order these.

## 2024-02-23 NOTE — Progress Notes (Addendum)
    Procedures performed today:    None.  Independent interpretation of notes and tests performed by another provider:   None.  Brief History, Exam, Impression, and Recommendations:    Chronic renal insufficiency, stage III (moderate) Jeremiah Webb comes in with a lot of questions, he does have some chronic renal insufficiency, though his renal function has been stable over several years. He has a brother that had renal failure, and was on dialysis. He is very worried about his own renal function and potential progression. Most recent GFR was 57 but has been fairly stable over the past few years. He has never had hyperkalemia, his blood pressure has remained normal over the years. He does not have any swelling. His cardiologist did put him on losartan  which I think is highly appropriate. We also discussed SGLT2 inhibitors such as Jardiance. He requested a nephrology referral as well. We will check a microalbumin creatinine ratio. We advised avoidance of nephrotoxic medications such as excessive use of NSAIDs, aggressive hydration. He does have a desire to get some labs checked today, I am happy to order these.  Subclinical hypothyroidism TSH is slightly low suggesting that levothyroxine  dose is a bit high, we should decrease from 150 mcg to 137 mcg and recheck TSH in 6 weeks.    I spent 40 minutes of total time managing this patient today, this includes chart review, face to face, and non-face to face time.  ____________________________________________ Debby PARAS. Curtis, M.D., ABFM., CAQSM., AME. Primary Care and Sports Medicine [place] [facility]  Adjunct Professor of [facility] Medicine  [employer]  Restaurant manager, fast food

## 2024-02-24 ENCOUNTER — Ambulatory Visit: Payer: Self-pay | Admitting: Sports Medicine

## 2024-02-24 MED ORDER — LEVOTHYROXINE SODIUM 137 MCG PO TABS: 137.0000 ug | ORAL_TABLET | Freq: Every day | ORAL | 11 refills | Status: AC

## 2024-02-24 NOTE — Addendum Note (Signed)
 Addended by: CURTIS DEBBY PARAS on: 02/24/2024 09:35 AM   Modules accepted: Orders

## 2024-02-24 NOTE — Assessment & Plan Note (Signed)
 TSH is slightly low suggesting that levothyroxine  dose is a bit high, we should decrease from 150 mcg to 137 mcg and recheck TSH in 6 weeks.

## 2024-02-25 LAB — COMPREHENSIVE METABOLIC PANEL WITH GFR
ALT: 28 IU/L (ref 0–44)
AST: 23 IU/L (ref 0–40)
Albumin: 3.9 g/dL (ref 3.9–4.9)
Alkaline Phosphatase: 55 IU/L (ref 44–121)
BUN/Creatinine Ratio: 14 (ref 10–24)
BUN: 21 mg/dL (ref 8–27)
Bilirubin Total: 0.6 mg/dL (ref 0.0–1.2)
CO2: 20 mmol/L (ref 20–29)
Calcium: 9.2 mg/dL (ref 8.6–10.2)
Chloride: 109 mmol/L — ABNORMAL HIGH (ref 96–106)
Creatinine, Ser: 1.47 mg/dL — ABNORMAL HIGH (ref 0.76–1.27)
Globulin, Total: 2.6 g/dL (ref 1.5–4.5)
Glucose: 101 mg/dL — ABNORMAL HIGH (ref 70–99)
Potassium: 4 mmol/L (ref 3.5–5.2)
Sodium: 143 mmol/L (ref 134–144)
Total Protein: 6.5 g/dL (ref 6.0–8.5)
eGFR: 54 mL/min/1.73 — ABNORMAL LOW (ref >59)

## 2024-02-25 LAB — LIPID PANEL
Chol/HDL Ratio: 3.2 ratio (ref 0.0–5.0)
Cholesterol, Total: 123 mg/dL (ref 100–199)
HDL: 39 mg/dL — ABNORMAL LOW (ref >39)
LDL Chol Calc (NIH): 68 mg/dL (ref 0–99)
Triglycerides: 78 mg/dL (ref 0–149)
VLDL Cholesterol Cal: 16 mg/dL (ref 5–40)

## 2024-02-25 LAB — HEMOGLOBIN A1C
Est. average glucose Bld gHb Est-mCnc: 94 mg/dL
Hgb A1c MFr Bld: 4.9 % (ref 4.8–5.6)

## 2024-02-25 LAB — CBC
Hematocrit: 43 % (ref 37.5–51.0)
Hemoglobin: 13.2 g/dL (ref 13.0–17.7)
MCH: 29.3 pg (ref 26.6–33.0)
MCHC: 30.7 g/dL — ABNORMAL LOW (ref 31.5–35.7)
MCV: 95 fL (ref 79–97)
Platelets: 269 x10E3/uL (ref 150–450)
RBC: 4.51 x10E6/uL (ref 4.14–5.80)
RDW: 11.8 % (ref 11.6–15.4)
WBC: 4.3 x10E3/uL (ref 3.4–10.8)

## 2024-02-25 LAB — TSH: TSH: 0.029 u[IU]/mL — ABNORMAL LOW (ref 0.450–4.500)

## 2024-02-25 LAB — MICROALBUMIN / CREATININE URINE RATIO
Creatinine, Urine: 107.7 mg/dL
Microalb/Creat Ratio: 6 mg/g{creat} (ref 0–29)
Microalbumin, Urine: 6 ug/mL

## 2024-02-28 LAB — TESTOSTERONE, FREE, DIRECT
Testosterone, Free: 7.4 pg/mL (ref 6.6–18.1)
Testosterone, Total, LC/MS: 307.6 ng/dL (ref 264.0–916.0)

## 2024-02-28 LAB — SPECIMEN STATUS REPORT

## 2024-03-01 ENCOUNTER — Ambulatory Visit: Admitting: Sports Medicine

## 2024-03-10 ENCOUNTER — Encounter: Payer: Self-pay | Admitting: Sports Medicine

## 2024-03-11 ENCOUNTER — Encounter (HOSPITAL_COMMUNITY): Payer: Self-pay | Admitting: *Deleted

## 2024-03-11 ENCOUNTER — Telehealth (HOSPITAL_COMMUNITY): Payer: Self-pay | Admitting: *Deleted

## 2024-03-11 NOTE — Telephone Encounter (Signed)
 GXT instructions was sent via mail [employer].

## 2024-03-17 NOTE — Telephone Encounter (Signed)
 Completed form was attached to a MyChart message which was sent to Jeremiah Webb in a separate encounter.

## 2024-03-18 ENCOUNTER — Encounter (HOSPITAL_COMMUNITY): Payer: Self-pay | Admitting: *Deleted

## 2024-03-23 ENCOUNTER — Other Ambulatory Visit (HOSPITAL_COMMUNITY)

## 2024-03-26 ENCOUNTER — Ambulatory Visit (HOSPITAL_COMMUNITY)

## 2024-03-29 ENCOUNTER — Encounter: Payer: Self-pay | Admitting: Sports Medicine

## 2024-03-29 NOTE — Telephone Encounter (Signed)
 Looks like he is trying to establish his son, that is okay since first-degree relative.

## 2024-04-05 ENCOUNTER — Ambulatory Visit (HOSPITAL_COMMUNITY)
Admission: EM | Admit: 2024-04-05 | Discharge: 2024-04-05 | Disposition: A | Discharge status: Home / Self Care | Attending: Family Medicine | Admitting: Family Medicine

## 2024-04-05 ENCOUNTER — Encounter (HOSPITAL_COMMUNITY): Payer: Self-pay | Admitting: Emergency Medicine

## 2024-04-05 ENCOUNTER — Ambulatory Visit (INDEPENDENT_AMBULATORY_CARE_PROVIDER_SITE_OTHER)

## 2024-04-05 DIAGNOSIS — R0781 Pleurodynia: Secondary | ICD-10-CM | POA: Diagnosis not present

## 2024-04-05 MED ORDER — HYDROCODONE-ACETAMINOPHEN 5-325 MG PO TABS
1.0000 | ORAL_TABLET | Freq: Four times a day (QID) | ORAL | 0 refills | Status: AC | PRN
Start: 2024-04-05 — End: ?

## 2024-04-05 NOTE — ED Provider Notes (Signed)
 [facility]    CSN: [id number] Arrival date & time: 04/05/24  1933      History   Chief Complaint Chief Complaint  Patient presents with   Chest Injury    HPI Jeremiah Webb is a 63 y.o. male.   HPI Here for pain in left anterior lateral chest, began after being elbowed really hard in a basketball game yesterday.  Hurts to breathe, cough, turn, raise arm.  Allergic to ibuprofen , causes hives.  Also has h/o CKD, last eGFR was 54  No f/c/cough Past Medical History:  Diagnosis Date   Benign essential hypertension 08/11/2018   Hypothyroidism    Thyroid  disease    Hypothyroid    Patient Active Problem List   Diagnosis Date Noted   Frequent headaches 09/13/2022   HNP (herniated nucleus pulposus), cervical 07/30/2019   Pes planus, congenital 10/19/2018   S/P TKR (total knee replacement), right  1/20 08/23/2018   Chronic renal insufficiency, stage III (moderate) 08/23/2018   Benign essential hypertension 08/11/2018   Upper airway cough syndrome 01/30/2018   Annual physical exam 12/22/2017   Subclinical hypothyroidism 08/26/2017   Acquired cyst of kidney 01/20/2017   Chronic bilateral shoulder pain with glenohumeral arthritis and rotator cuff tearing 06/24/2016   Male hypogonadism 06/06/2016   Infrapatellar bursitis of right knee 12/19/2015   Crystalline arthropathy with gout and pseudogout 09/22/2015   Palpitations 02/14/2015   Degenerative disc disease, cervical 04/05/2014    Past Surgical History:  Procedure Laterality Date   CERVICAL DISC SURGERY     #5   POSTERIOR CERVICAL LAMINECTOMY WITH MET- RX Left 09/20/2014   Procedure: Left C7-T1 Sitting Microdiskectomy;  Surgeon: Victory Gens, MD;  Location: [facility];  Service: Neurosurgery;  Laterality: Left;  Left C7-T1 Sitting Microdiskectomy   QUADRICEPS TENDON REPAIR     SHOULDER SURGERY         Home Medications    Prior to Admission medications   Medication Sig Start Date End Date  Taking? Authorizing Provider  HYDROcodone -acetaminophen  (NORCO/VICODIN) 5-325 MG tablet Take 1 tablet by mouth every 6 (six) hours as needed (pain). 04/05/24  Yes Vonna Sharlet POUR, MD  levothyroxine  (SYNTHROID ) 137 MCG tablet Take 1 tablet (137 mcg total) by mouth daily before breakfast. 02/24/24   Curtis Debby PARAS, MD  losartan  (COZAAR ) 25 MG tablet Take 0.5 tablets (12.5 mg total) by mouth daily. Patient not taking: Reported on 02/23/2024 01/16/24   Glena Harlene HERO, FNP    Family History Family History  Problem Relation Age of Onset   Heart disease Mother    Stroke Mother    Diabetes Father    Colon cancer Maternal Grandfather    Esophageal cancer Neg Hx    Rectal cancer Neg Hx    Stomach cancer Neg Hx     Social History Social History   Tobacco Use   Smoking status: Never   Smokeless tobacco: Never  Substance Use Topics   Alcohol use: Yes    Alcohol/week: 0.0 standard drinks of alcohol    Comment: occasional   Drug use: No     Allergies   Ibuprofen    Review of Systems Review of Systems   Physical Exam Triage Vital Signs ED Triage Vitals  Encounter Vitals Group     BP 04/05/24 2016 126/77     Girls Systolic BP Percentile --      Girls Diastolic BP Percentile --      Boys Systolic BP Percentile --  Boys Diastolic BP Percentile --      Pulse Rate 04/05/24 2016 (!) 56     Resp 04/05/24 2016 16     Temp 04/05/24 2016 98 F (36.7 C)     Temp Source 04/05/24 2016 Oral     SpO2 04/05/24 2016 99 %     Weight --      Height --      Head Circumference --      Peak Flow --      Pain Score 04/05/24 2015 0     Pain Loc --      Pain Education --      Exclude from Growth Chart --    No data found.  Updated Vital Signs BP 126/77 (BP Location: Left Arm)   Pulse (!) 56   Temp 98 F (36.7 C) (Oral)   Resp 16   SpO2 99%   Visual Acuity Right Eye Distance:   Left Eye Distance:   Bilateral Distance:    Right Eye Near:   Left Eye Near:     Bilateral Near:     Physical Exam Vitals reviewed.  Constitutional:      General: He is not in acute distress.    Appearance: He is not toxic-appearing.  HENT:     Mouth/Throat:     Mouth: Mucous membranes are moist.  Cardiovascular:     Rate and Rhythm: Normal rate and regular rhythm.     Heart sounds: No murmur heard. Pulmonary:     Effort: Pulmonary effort is normal.     Breath sounds: Normal breath sounds.     Comments: Chest is tender just lateral to the midclavicular line at about rib 5-6. No deformity Skin:    Coloration: Skin is not pale.  Neurological:     General: No focal deficit present.     Mental Status: He is alert and oriented to person, place, and time.  Psychiatric:        Behavior: Behavior normal.      UC Treatments / Results  Labs (all labs ordered are listed, but only abnormal results are displayed) Labs Reviewed - No data to display  EKG   Radiology No results found.  Procedures Procedures (including critical care time)  Medications Ordered in UC Medications - No data to display  Initial Impression / Assessment and Plan / UC Course  I have reviewed the triage vital signs and the nursing notes.  Pertinent labs & imaging results that were available during my care of the patient were reviewed by me and considered in my medical decision making (see chart for details).     There is a possible fracture of rib 4. He is advised of radiology overread  He cannot take NSAID's due to CKD and allergies. Hydrocodone  sent to the pharmacy. Nothing concerning on pmp. He is provided an incentive spirometry. Final Clinical Impressions(s) / UC Diagnoses   Final diagnoses:  Rib pain on left side     Discharge Instructions      There is a possible fracture in the 4th and fifth ribs. The radiologist will also read your x-ray, and if their interpretation differs significantly from mine, and the management of your condition would change, we will call  you.  Hydrocodone  5 mg/325 mg acetaminophen --1 tablet every 6 hours as needed for pain.  This is best taken with food.  It can cause sleepiness or dizziness  Ice the painful area.  Follow up with your primary care.  ED Prescriptions     Medication Sig Dispense Auth. Provider   HYDROcodone -acetaminophen  (NORCO/VICODIN) 5-325 MG tablet Take 1 tablet by mouth every 6 (six) hours as needed (pain). 12 tablet Henrick Mcgue K, MD      I have reviewed the PDMP during this encounter.   Vonna Sharlet POUR, MD 04/05/24 2051

## 2024-04-05 NOTE — Discharge Instructions (Addendum)
 There is a possible fracture in the 4th and fifth ribs. The radiologist will also read your x-ray, and if their interpretation differs significantly from mine, and the management of your condition would change, we will call you.  Hydrocodone  5 mg/325 mg acetaminophen --1 tablet every 6 hours as needed for pain.  This is best taken with food.  It can cause sleepiness or dizziness  Ice the painful area.  Follow up with your primary care.

## 2024-04-05 NOTE — ED Triage Notes (Signed)
 Pt reports that was playing basketball yesterday and got hit in the chest. Pt reports when coughing, moving, deep breathing has pain on left side of chest. Requesting xray. Hasn't done any measures for pain.

## 2024-04-06 ENCOUNTER — Ambulatory Visit (HOSPITAL_COMMUNITY): Payer: Self-pay

## 2024-04-13 ENCOUNTER — Encounter: Payer: Self-pay | Admitting: Sports Medicine

## 2024-04-22 ENCOUNTER — Encounter (HOSPITAL_COMMUNITY): Payer: Self-pay | Admitting: *Deleted

## 2024-05-04 ENCOUNTER — Ambulatory Visit (HOSPITAL_COMMUNITY)

## 2024-05-06 ENCOUNTER — Ambulatory Visit (HOSPITAL_COMMUNITY): Admission: RE | Admit: 2024-05-06 | Source: Ambulatory Visit

## 2024-06-10 ENCOUNTER — Other Ambulatory Visit (HOSPITAL_COMMUNITY)

## 2024-06-10 ENCOUNTER — Telehealth (HOSPITAL_COMMUNITY): Payer: Self-pay | Admitting: *Deleted

## 2024-06-10 ENCOUNTER — Encounter (HOSPITAL_COMMUNITY)

## 2024-06-10 NOTE — Telephone Encounter (Signed)
 ETT instructions left on pt's vm per dpr.

## 2024-06-17 ENCOUNTER — Ambulatory Visit (HOSPITAL_COMMUNITY)

## 2024-06-17 ENCOUNTER — Ambulatory Visit (HOSPITAL_COMMUNITY): Admission: RE | Admit: 2024-06-17 | Source: Ambulatory Visit | Attending: Family Medicine | Admitting: Family Medicine

## 2024-07-26 ENCOUNTER — Telehealth (HOSPITAL_COMMUNITY): Payer: Self-pay | Admitting: *Deleted

## 2024-07-26 NOTE — Telephone Encounter (Signed)
 Patient reminded of upcoming stress test.  Jeremiah Webb

## 2024-07-28 ENCOUNTER — Ambulatory Visit (HOSPITAL_COMMUNITY): Admission: RE | Admit: 2024-07-28 | Discharge: 2024-07-28 | Attending: Cardiology | Admitting: Cardiology

## 2024-07-28 DIAGNOSIS — I10 Essential (primary) hypertension: Secondary | ICD-10-CM | POA: Insufficient documentation

## 2024-07-28 DIAGNOSIS — R079 Chest pain, unspecified: Secondary | ICD-10-CM | POA: Insufficient documentation

## 2024-07-28 LAB — EXERCISE TOLERANCE TEST
Angina Index: 0
Duke Treadmill Score: 11
Estimated workload: 13.2
Exercise duration (min): 10 min
Exercise duration (sec): 55 s
MPHR: 159 {beats}/min
Peak HR: 181 {beats}/min
Percent HR: 113 %
Rest HR: 60 {beats}/min
ST Depression (mm): 0 mm

## 2024-07-28 LAB — ECHOCARDIOGRAM COMPLETE
Area-P 1/2: 1.98 cm2
S' Lateral: 2.75 cm

## 2024-08-20 ENCOUNTER — Encounter: Payer: 59 | Admitting: Sports Medicine

## 2024-09-17 ENCOUNTER — Telehealth: Payer: Self-pay

## 2024-09-17 NOTE — Telephone Encounter (Signed)
 Copied from CRM [id number]. Topic: General - Other >> Sep 17, 2024  1:10 PM Dominique E wrote: Reason for CRM: Pt says he has provided his [facility] card information and wants medicare removed from his chart. Was driving and did not have the his insurance information available.  Please help with removing MEDICARE and leaving [employer] as primary.

## 2024-12-03 ENCOUNTER — Encounter: Admitting: Urgent Care
